# Patient Record
Sex: Male | Born: 1960
Health system: Southern US, Community
[De-identification: ages and names within clinical notes are randomized; demographics above are authoritative.]

## PROBLEM LIST (undated history)

## (undated) DIAGNOSIS — M25551 Pain in right hip: Secondary | ICD-10-CM

## (undated) DIAGNOSIS — G4730 Sleep apnea, unspecified: Secondary | ICD-10-CM

## (undated) DIAGNOSIS — I10 Essential (primary) hypertension: Secondary | ICD-10-CM

## (undated) DIAGNOSIS — N189 Chronic kidney disease, unspecified: Secondary | ICD-10-CM

## (undated) DIAGNOSIS — I5189 Other ill-defined heart diseases: Secondary | ICD-10-CM

## (undated) DIAGNOSIS — K219 Gastro-esophageal reflux disease without esophagitis: Secondary | ICD-10-CM

## (undated) DIAGNOSIS — E8722 Chronic metabolic acidosis: Secondary | ICD-10-CM

## (undated) DIAGNOSIS — I214 Non-ST elevation (NSTEMI) myocardial infarction: Secondary | ICD-10-CM

## (undated) DIAGNOSIS — E119 Type 2 diabetes mellitus without complications: Secondary | ICD-10-CM

## (undated) DIAGNOSIS — N183 Chronic kidney disease, stage 3 unspecified: Secondary | ICD-10-CM

## (undated) DIAGNOSIS — Z720 Tobacco use: Secondary | ICD-10-CM

## (undated) DIAGNOSIS — E785 Hyperlipidemia, unspecified: Secondary | ICD-10-CM

## (undated) DIAGNOSIS — I2510 Atherosclerotic heart disease of native coronary artery without angina pectoris: Secondary | ICD-10-CM

## (undated) DIAGNOSIS — M25552 Pain in left hip: Secondary | ICD-10-CM

## (undated) HISTORY — DX: Pain in right hip: M25.551

## (undated) HISTORY — DX: Chronic kidney disease, stage 3 unspecified: N18.30

## (undated) HISTORY — PX: EXPLORATORY LAPAROTOMY: SUR591

## (undated) HISTORY — PX: APPENDECTOMY: SHX54

## (undated) HISTORY — PX: HERNIA REPAIR: SHX51

## (undated) HISTORY — DX: Pain in right hip: M25.552

## (undated) HISTORY — PX: CARDIAC CATHETERIZATION: SHX172

## (undated) HISTORY — PX: COLONOSCOPY: SHX174

## (undated) HISTORY — DX: Other ill-defined heart diseases: I51.89

## (undated) HISTORY — PX: EYE SURGERY: SHX253

---

## 1997-10-04 ENCOUNTER — Inpatient Hospital Stay (HOSPITAL_COMMUNITY): Admission: EM | Admit: 1997-10-04 | Discharge: 1997-10-05 | Payer: Self-pay | Admitting: Emergency Medicine

## 2003-06-04 ENCOUNTER — Other Ambulatory Visit: Payer: Self-pay

## 2003-06-05 ENCOUNTER — Other Ambulatory Visit: Payer: Self-pay

## 2004-04-12 ENCOUNTER — Other Ambulatory Visit: Payer: Self-pay

## 2004-04-12 ENCOUNTER — Emergency Department: Payer: Self-pay | Admitting: General Practice

## 2004-11-12 ENCOUNTER — Inpatient Hospital Stay: Payer: Self-pay | Admitting: Internal Medicine

## 2004-11-12 ENCOUNTER — Other Ambulatory Visit: Payer: Self-pay

## 2004-11-14 ENCOUNTER — Other Ambulatory Visit: Payer: Self-pay

## 2006-02-26 ENCOUNTER — Other Ambulatory Visit: Payer: Self-pay

## 2006-02-26 ENCOUNTER — Ambulatory Visit: Payer: Self-pay | Admitting: Orthopedic Surgery

## 2006-03-05 ENCOUNTER — Ambulatory Visit: Payer: Self-pay | Admitting: Orthopedic Surgery

## 2007-02-28 ENCOUNTER — Emergency Department: Payer: Self-pay | Admitting: Emergency Medicine

## 2007-03-02 ENCOUNTER — Ambulatory Visit: Payer: Self-pay

## 2007-03-03 ENCOUNTER — Emergency Department (HOSPITAL_COMMUNITY): Admission: EM | Admit: 2007-03-03 | Discharge: 2007-03-03 | Payer: Self-pay | Admitting: *Deleted

## 2007-03-10 ENCOUNTER — Ambulatory Visit (HOSPITAL_COMMUNITY): Admission: RE | Admit: 2007-03-10 | Discharge: 2007-03-10 | Payer: Self-pay | Admitting: Neurosurgery

## 2007-03-12 ENCOUNTER — Ambulatory Visit (HOSPITAL_COMMUNITY): Admission: RE | Admit: 2007-03-12 | Discharge: 2007-03-13 | Payer: Self-pay | Admitting: Neurosurgery

## 2008-06-25 ENCOUNTER — Emergency Department: Payer: Self-pay | Admitting: Internal Medicine

## 2009-06-05 ENCOUNTER — Ambulatory Visit: Payer: Self-pay | Admitting: Gastroenterology

## 2009-12-22 ENCOUNTER — Ambulatory Visit: Payer: Self-pay

## 2010-03-14 ENCOUNTER — Inpatient Hospital Stay: Payer: Self-pay | Admitting: Surgery

## 2010-03-16 LAB — PATHOLOGY REPORT

## 2010-04-17 ENCOUNTER — Ambulatory Visit: Payer: Self-pay | Admitting: Surgery

## 2010-04-24 ENCOUNTER — Ambulatory Visit: Payer: Self-pay | Admitting: Surgery

## 2010-04-25 ENCOUNTER — Observation Stay: Payer: Self-pay | Admitting: Surgery

## 2010-04-30 LAB — PATHOLOGY REPORT

## 2010-05-01 ENCOUNTER — Ambulatory Visit: Payer: Self-pay | Admitting: Family Medicine

## 2010-05-14 ENCOUNTER — Ambulatory Visit: Payer: Self-pay | Admitting: Family Medicine

## 2010-07-03 NOTE — Consult Note (Signed)
[name], [name]                 ACCOUNT NO.:  [id number]   MEDICAL RECORD NO.:  [id number]          PATIENT TYPE:  EMS   LOCATION:  [facility]                         FACILITY:  [facility]   PHYSICIAN:  Ophelia Charter, M.D.DATE OF BIRTH:  Mar 13, 1960   DATE OF CONSULTATION:  03/03/2007  DATE OF DISCHARGE:  03/03/2007                                 CONSULTATION   CHIEF COMPLAINT:  Right arm pain.   HISTORY OF PRESENT ILLNESS:  The patient is a 50 year old white male who  was in his usual state of good health until approximately 51-month ago.  At that time, he woke up with a crack of my neck.  It progressed to  severe pain in his right shoulder as well as right arm.  He does not  recall any specific precipitating event.  The patient was seen by Dr.  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  He was able to help him  some, but the pain kept recurring.  He was then worked up with a  cervical CT which demonstrated some narrowing and he was referred to our  office.  The patient had been scheduled to see Dr. Joya Salm, on March 05, 2007, but he could not; the pain became bearable and he presented to  the emergency department on the evening of March 03, 2007.  The  patient was seen by the emergency room staff and a neurosurgical  consultation was requested.  The patient had been seen in [place] and  prescribed a steroid taper.   Presently, the patient is accompanied by his wife, daughter, and mother.  He complains of severe pain radiating into his right arm with numbness  and tingling what sounds like the C6 or C7 distribution on the right.  He does not have any radicular pain on the left.  His arm bothers him  more than his neck.  He feels that he is getting worse despite time in  medical management.   The patient has been treated with medications in the emergency  department including morphine, etc., and feels somewhat better  presently.  He has not had any physical therapy,  injections, NCS, EMGs,  etc.   PAST MEDICAL HISTORY:  Positive for myocardial infarction when he was 49  years old.  He has evidently had 3 myocardial infarctions altogether.  He sees Dr. Clayborn Bigness, a cardiologist in [employer].  According to the  patient, he underwent stress test about a month ago and it was okay.  The patient also has a history of diabetes mellitus, hypertension,  hypercholesterolemia, carpal tunnel syndrome, cataracts, and umbilical  hernia.   PAST SURGICAL HISTORY:  Carpal tunnel release, umbilical herniorrhaphy,  placement of cardiac stents, and cataract removed on the left.   PRESENT MEDICATIONS:  1. Plavix 75 mg p.o. daily.  2. Metoprolol 50 mg p.o. daily.  3. Lisinopril/hydrochlorothiazide 20/25 1 p.o. daily.  4. Metformin 1000 mg p.o. daily.  5. Glipizide 2.5 mg p.o. daily.  6. Crestor 40 mg p.o. daily.  7. Fish oil, vinegar pill, and aspirin 81 mg p.o. daily.   DRUG  ALLERGIES:  PENICILLIN causes swelling.   FAMILY MEDICAL HISTORY:  The patient's mother's age is 62 in good  health.  The patient's father died at age of 12 with heart attack.   SOCIAL HISTORY:  The patient is married.  He lives in [place].  He is  employed as a Administrator.  He smokes approximately 2 packs per day of  cigarettes.  I advised him to quit.  He denies ethanol drug use.   REVIEW OF SYSTEMS:  Negative except as above.  Denies chest pain.   PHYSICAL EXAMINATION:  GENERAL:  A pleasant, obese 50 year old white  male in no obvious distress complaining of right arm pain holding his  right arm over his head for relief.  HEENT:  Normocephalic and atraumatic.  Pupils are equal, round, and  reactive to light.  Extraocular movements are intact.  Oropharynx  benign.  NECK:  Supple.  There is no masses or deformities or tracheal deviation  or distention. The patient has limited cervical range of motion.  Spurling testing is positive on the right and negative on the left.  Lhermitte  sign was not present.  THORAX:  Symmetric.  LUNGS:  Clear to auscultation.  HEART:  Regular rate and rhythm.  ABDOMEN:  Obese and nontender.  EXTREMITIES:  Full good range of motion of his right shoulder.  No  deformities.  BACK:  Grossly normal.  NEUROLOGIC:  The patient is alert and oriented x3.  Cranial nerves II  through XII were examined bilaterally and grossly normal.  Vision and  hearing grossly normal bilaterally.  Motor strength is 5/5 in his  bilateral deltoids, biceps, handgrip, interosseous, psoas, quadriceps,  gastrocnemius, and extensor hallucis longus, left wrist extensor, and  triceps.  He has some slight weakness in his right wrist extensor and  triceps at 4+/5.  Cerebellar exam is intact with rapid alternating  movements of the upper extremities bilaterally.  Sensory exam  demonstrates decreased sensation in the C6 and/or C7 distribution of the  right; otherwise unremarkable.  Deep tendon reflexes are 1/4 in his  bilateral biceps, triceps, brachialis, quadriceps, and gastrocnemius.  There is no ankle clonus.   IMAGING STUDIES:  I have reviewed the patient's cervical CT performed  without contrast at [facility] on March 03, 2007.  The  patient appears to have a herniated disc at C5-C6 on the right with some  spondylosis resulting in significant right neuroforaminal stenosis.  The  resolution is not sufficient to rule out rupture disc to C6-C7 or C4-C5.   ASSESSMENT AND PLAN:  1. A C5-C6 herniated disc, cervical radiculopathy, and cervicalgia.  I      have discussed the situation with the patient and his family (at      the patient's request).  I told him that I think he rarely is      suffering from a right C6 radiculopathy caused by disc herniation      at C5-C6.  He cannot get a cervical MRI because he evidently has      metallic stents and because he is on metformin, glipizide, aspirin,      and I cannot do myelogram for about a week.  We therefore  setup for      a cervical CT as above that demonstrates some narrowing C5-C6, but      the resolution is not adequate for me to tell for sure he does not      have ruptured disc at that levels.  I therefore recommended we      treat him medically and planned to do a myelogram, cervical MRI,      and CT next week.  I described this procedure and the risk, mainly      of headaches.  I have answered all of his questions regarding that.      The patient would like to pursue the study and I have instructed      the patient to follow up with my office tomorrow, so we can make      these arrangements and I can discuss this further showing him his      CAT scan as well as surgical pamphlets, etc.  I gave the patient      the prescription for Percocet 10/325 mg #50 one  p.o. q.4 h. p.r.n.      for pain as well as Valium 5 mg #50 one p.o. q.8 h. for muscle      spasms.  He is going to finish out his      steroid taper and I will see him tomorrow.  2. Chronic issues.  I have also explained him that in order to pursue      surgery, we will need to get cardiac clearance from Dr. Clayborn Bigness.      We will put a call to him tomorrow.      Ophelia Charter, M.D.  Electronically Signed     JDJ/MEDQ  D:  03/04/2007  T:  03/04/2007  Job:  [id number]   cc:   Jillyn Hidden  Dwayne D. Clayborn Bigness, MD

## 2010-07-03 NOTE — Op Note (Signed)
NAMEARIS, DEROUSSE                 ACCOUNT NO.:  [id number]   MEDICAL RECORD NO.:  [id number]          PATIENT TYPE:  INP   LOCATION:  3036                         FACILITY:  [facility]   PHYSICIAN:  Ophelia Charter, M.D.DATE OF BIRTH:  1961-01-29   DATE OF PROCEDURE:  03/12/2007  DATE OF DISCHARGE:                               OPERATIVE REPORT   BRIEF HISTORY:  The patient is a 50 year old white male who has suffered  from approximately 6 weeks of severe neck and right arm pain consistent  with a right C6 radiculopathy.  He failed medical management, was worked  up with cervical myo-CT which demonstrated the patient had a herniated  disk at C5-6 on the right as well as diffuse mild degenerative changes.  I discussed various treatment options with the patient including  surgery.  He has weighed the risks, benefits and alternatives of surgery  and decided to proceed with a C5-6 anterior cervical diskectomy, fusion,  plating.   PREOPERATIVE DIAGNOSIS:  C5-6 herniated nucleus pulposus, spondylosis,  degenerative disease, stenosis, cervical radiculopathy, myelopathy,  cervicalgia.   POSTOPERATIVE DIAGNOSIS:  C5-6 herniated nucleus pulposus, spondylosis,  degenerative disease, stenosis, cervical radiculopathy, myelopathy,  cervicalgia.   PROCEDURE:  C5-6 extensive anterior cervical diskectomy/decompression;  C5-6 anterior interbody local autograft and Actifuse bone graft  extender, arthrodesis; insertion of C5-6 interbody prosthesis (Alphatec  PEEK interbody prosthesis); C5-6 anterior cervical plating (Codman slim  lock titanium plate and screws).   SURGEON:  Dr.  Earle Gell.   ASSISTANT:  Dr. Leeroy Cha.   ANESTHESIA:  General endotracheal.   ESTIMATED BLOOD LOSS:  50 mL.   SPECIMENS:  None.   DRAINS:  None.   COMPLICATIONS:  None.   DESCRIPTION OF PROCEDURE:  The patient was brought to the operating room  by anesthesia team, general endotracheal anesthesia was  induced.  The  patient remained supine position.  A roll was placed under his shoulders  to place his neck in slight extension.  The anterior cervical region was  then prepared with Betadine scrub and Betadine solution.  Sterile drapes  were applied.  I then injected the area to be incised with Marcaine with  epinephrine solution.  I used a scalpel to make a transverse incision in  the patient's left anterior neck.  I used the Metzenbaum scissors to  divide the platysma muscle and then to dissect medial to  sternocleidomastoid muscle, jugular vein and carotid artery.  I  carefully dissected down towards the anterior cervical spine identifying  the esophagus and retracted it medially.  I then used Kitner swabs to  clear soft tissue from the anterior cervical spine.  We then inserted a  bent spinal needle into the upper exposed intervertebral disk space.  We  obtained intraoperative radiograph confirm our location.   We then used electrocautery to detach medial border of the longus colli  muscle from the C5-6 intervertebral disk space bilaterally.  We then  inserted the Caspar self-retaining retractor underneath the longus colli  muscle bilaterally to provide exposure.  We incised C5-6 intervertebral  disk with a 15 blade  scalpel, performed a partial intervertebral  diskectomy using pituitary forceps and Carlen curettes.  We then  inserted distraction screws at C5-C6, distracted interspace and then  used a high-speed drill to decorticate the vertebral endplates at 075-GRM,  drill away the remainder of C5-6 intervertebral disk, drill away some  posterior spondylosis and to thin out the posterior longitudinal  ligament.  We then incised with the ligament with arachnoid knife and  then removed it with Kerrison punch undercutting the vertebral end  plates at 075-GRM decompressing thecal sac.  We then performed foraminotomy  about the bilateral C6 nerve root completing the decompression.  Of  note,  we encountered expected large herniated disk at C5-6 on the right  compressing the right C6 nerve root as well as spondylosis.   Having completed the decompression, we now turned attention to  arthrodesis.  We used trial spacers and determined to use a 6-mm medium  PEEK body prosthesis.  We prefilled this prosthesis with local  morselized autograft bone we obtained during decompression as well as  Actifuse bone graft extender.  We inserted the prosthesis into the  distracted interspace and then removed distraction screws.  There was  good snug fit of the prosthesis.  We then filled laterally in disk pace  with Actifuse.   We now turned attention to the anterior instrumentation.  We used a high-  speed drill to remove some ventral spondylosis from vertebral endplates  075-GRM so that the plate lay down flat.  We selected appropriate length  Codman slim lock anterior cervical plate and laid it along the anterior  aspect of the vertebral bodies from C5 to C6.  We then drilled two 14 mm  holes at C5, two at C6 and secured the plate to the vertebral bodies by  placing two 14 mm self-tapping screws at C5, two at C6.  We then  obtained intraoperative radiograph to check the instrumentation.  There  was limited visualization because of the patient's body habitus but it  looked good in vivo.  We therefore secured these screws and plate by  locking each cam.   We then obtained hemostasis with bipolar cautery, irrigated the wound  out with bacitracin solution.  We then removed the retractor.  We  inspected the esophagus for any damage and there was none apparent.  We  then reapproximated the patient's platysma muscle with interrupted 3-0  Vicryl suture, subcutaneous tissue interrupted 3-0 Vicryl suture and the  skin with Steri-Strips and Benzoin.  The wound was then coated with  bacitracin ointment.  Sterile dressing applied.  The drapes were  removed.  The patient was subsequently extubated by  anesthesia team and  transported to the post anesthesia care unit in stable condition.  All  sponge, instrument and needle counts were correct at end of the case.      Ophelia Charter, M.D.  Electronically Signed     JDJ/MEDQ  D:  03/12/2007  T:  03/13/2007  Job:  [id number]

## 2010-11-08 LAB — BASIC METABOLIC PANEL
BUN: 11
CO2: 27
Calcium: 10
Chloride: 98
Creatinine, Ser: 0.97
GFR calc non Af Amer: >60
Glucose, Bld: 261 — ABNORMAL HIGH
Potassium: 4.9
Sodium: 133 — ABNORMAL LOW

## 2010-11-08 LAB — CBC
HCT: 48.5
Hemoglobin: 16.8
MCHC: 34.6
MCV: 90.4
Platelets: 225
RBC: 5.37
RDW: 12.6
WBC: 10.9 — ABNORMAL HIGH

## 2010-11-08 LAB — GLUCOSE, RANDOM: Glucose, Bld: 285 — ABNORMAL HIGH

## 2010-11-08 LAB — POCT I-STAT GLUCOSE
Glucose, Bld: 184 — ABNORMAL HIGH
Operator id: 132841

## 2011-02-05 ENCOUNTER — Emergency Department: Payer: Self-pay | Admitting: Emergency Medicine

## 2011-02-27 ENCOUNTER — Ambulatory Visit: Payer: Self-pay | Admitting: Surgery

## 2011-02-27 LAB — CBC WITH DIFFERENTIAL/PLATELET
Basophil #: 0 10*3/uL (ref 0.0–0.1)
Basophil %: 0.5 %
Eosinophil #: 0.2 10*3/uL (ref 0.0–0.7)
Eosinophil %: 2.5 %
HCT: 40.8 % (ref 40.0–52.0)
HGB: 14.1 g/dL (ref 13.0–18.0)
Lymphocyte #: 3.2 10*3/uL (ref 1.0–3.6)
Lymphocyte %: 33 %
MCH: 31.8 pg (ref 26.0–34.0)
MCHC: 34.7 g/dL (ref 32.0–36.0)
MCV: 92 fL (ref 80–100)
Monocyte #: 0.7 10*3/uL (ref 0.0–0.7)
Monocyte %: 7.5 %
Neutrophil #: 5.5 10*3/uL (ref 1.4–6.5)
Neutrophil %: 56.5 %
Platelet: 191 10*3/uL (ref 150–440)
RBC: 4.45 10*6/uL (ref 4.40–5.90)
RDW: 12.8 % (ref 11.5–14.5)
WBC: 9.7 10*3/uL (ref 3.8–10.6)

## 2011-02-27 LAB — BASIC METABOLIC PANEL
Anion Gap: 9 (ref 7–16)
BUN: 13 mg/dL (ref 7–18)
Calcium, Total: 9.2 mg/dL (ref 8.5–10.1)
Chloride: 100 mmol/L (ref 98–107)
Co2: 25 mmol/L (ref 21–32)
Creatinine: 0.97 mg/dL (ref 0.60–1.30)
EGFR (African American): >60
EGFR (Non-African Amer.): >60
Glucose: 354 mg/dL — ABNORMAL HIGH (ref 65–99)
Osmolality: 283 (ref 275–301)
Potassium: 4.5 mmol/L (ref 3.5–5.1)
Sodium: 134 mmol/L — ABNORMAL LOW (ref 136–145)

## 2011-03-04 ENCOUNTER — Other Ambulatory Visit: Payer: Self-pay | Admitting: Family Medicine

## 2011-03-04 LAB — RENAL FUNCTION PANEL
Albumin: 4.3 g/dL (ref 3.4–5.0)
Anion Gap: 9 (ref 7–16)
BUN: 16 mg/dL (ref 7–18)
Calcium, Total: 10 mg/dL (ref 8.5–10.1)
Chloride: 102 mmol/L (ref 98–107)
Co2: 28 mmol/L (ref 21–32)
Creatinine: 0.95 mg/dL (ref 0.60–1.30)
EGFR (African American): >60
EGFR (Non-African Amer.): >60
Glucose: 187 mg/dL — ABNORMAL HIGH (ref 65–99)
Osmolality: 284 (ref 275–301)
Phosphorus: 3.9 mg/dL (ref 2.5–4.9)
Potassium: 4.8 mmol/L (ref 3.5–5.1)
Sodium: 139 mmol/L (ref 136–145)

## 2011-03-06 ENCOUNTER — Ambulatory Visit: Payer: Self-pay | Admitting: Surgery

## 2011-11-28 ENCOUNTER — Other Ambulatory Visit: Payer: Self-pay | Admitting: Family Medicine

## 2011-11-28 ENCOUNTER — Ambulatory Visit
Admission: RE | Admit: 2011-11-28 | Discharge: 2011-11-28 | Disposition: A | Discharge status: Home / Self Care | Payer: Worker's Compensation | Source: Ambulatory Visit | Attending: Family Medicine | Admitting: Family Medicine

## 2011-11-28 DIAGNOSIS — W19XXXA Unspecified fall, initial encounter: Secondary | ICD-10-CM

## 2011-11-28 DIAGNOSIS — M542 Cervicalgia: Secondary | ICD-10-CM

## 2012-09-12 IMAGING — CR DG ABDOMEN 1V
1 series · 2 of 2 positions shown · non-contrast
Comparison: none

REASON FOR EXAM: f/u SBO
COMMENTS:

[Series 1: view not recorded · 0.17mm/px · 2 of 2 slices shown]
[im 1/2]
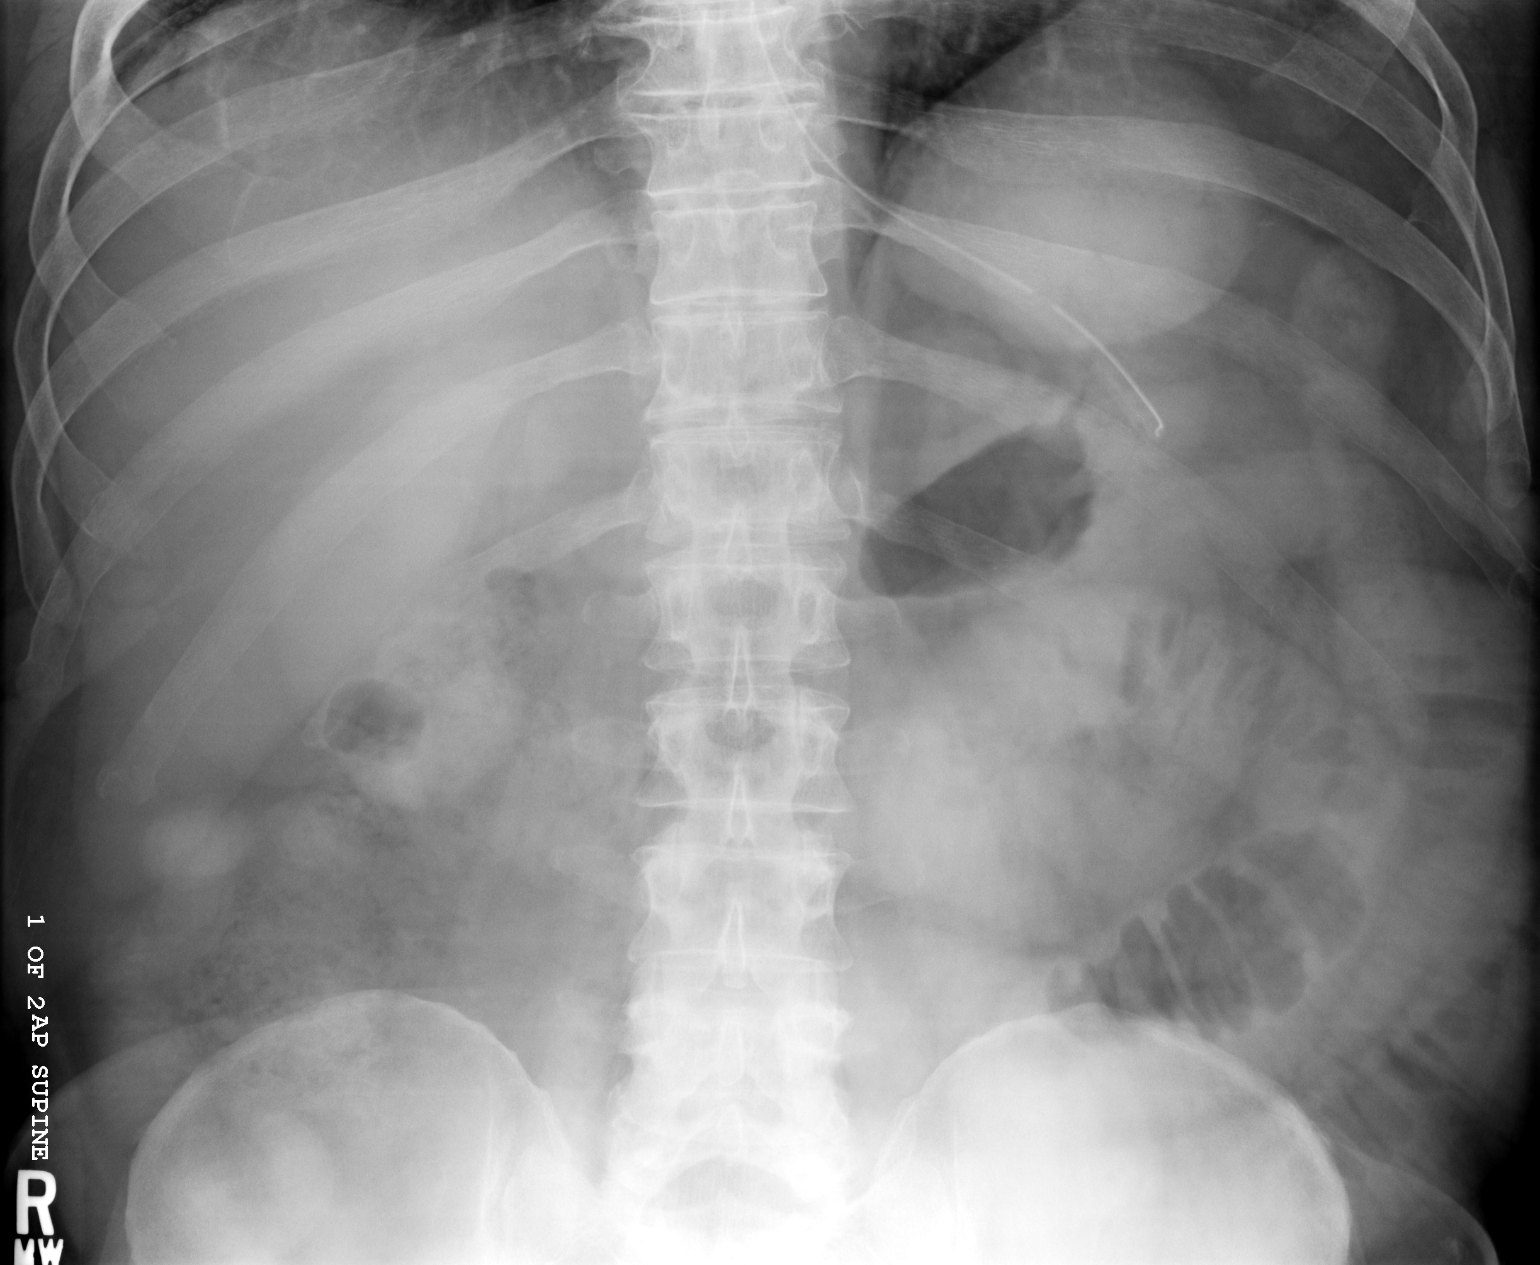
[im 2/2]
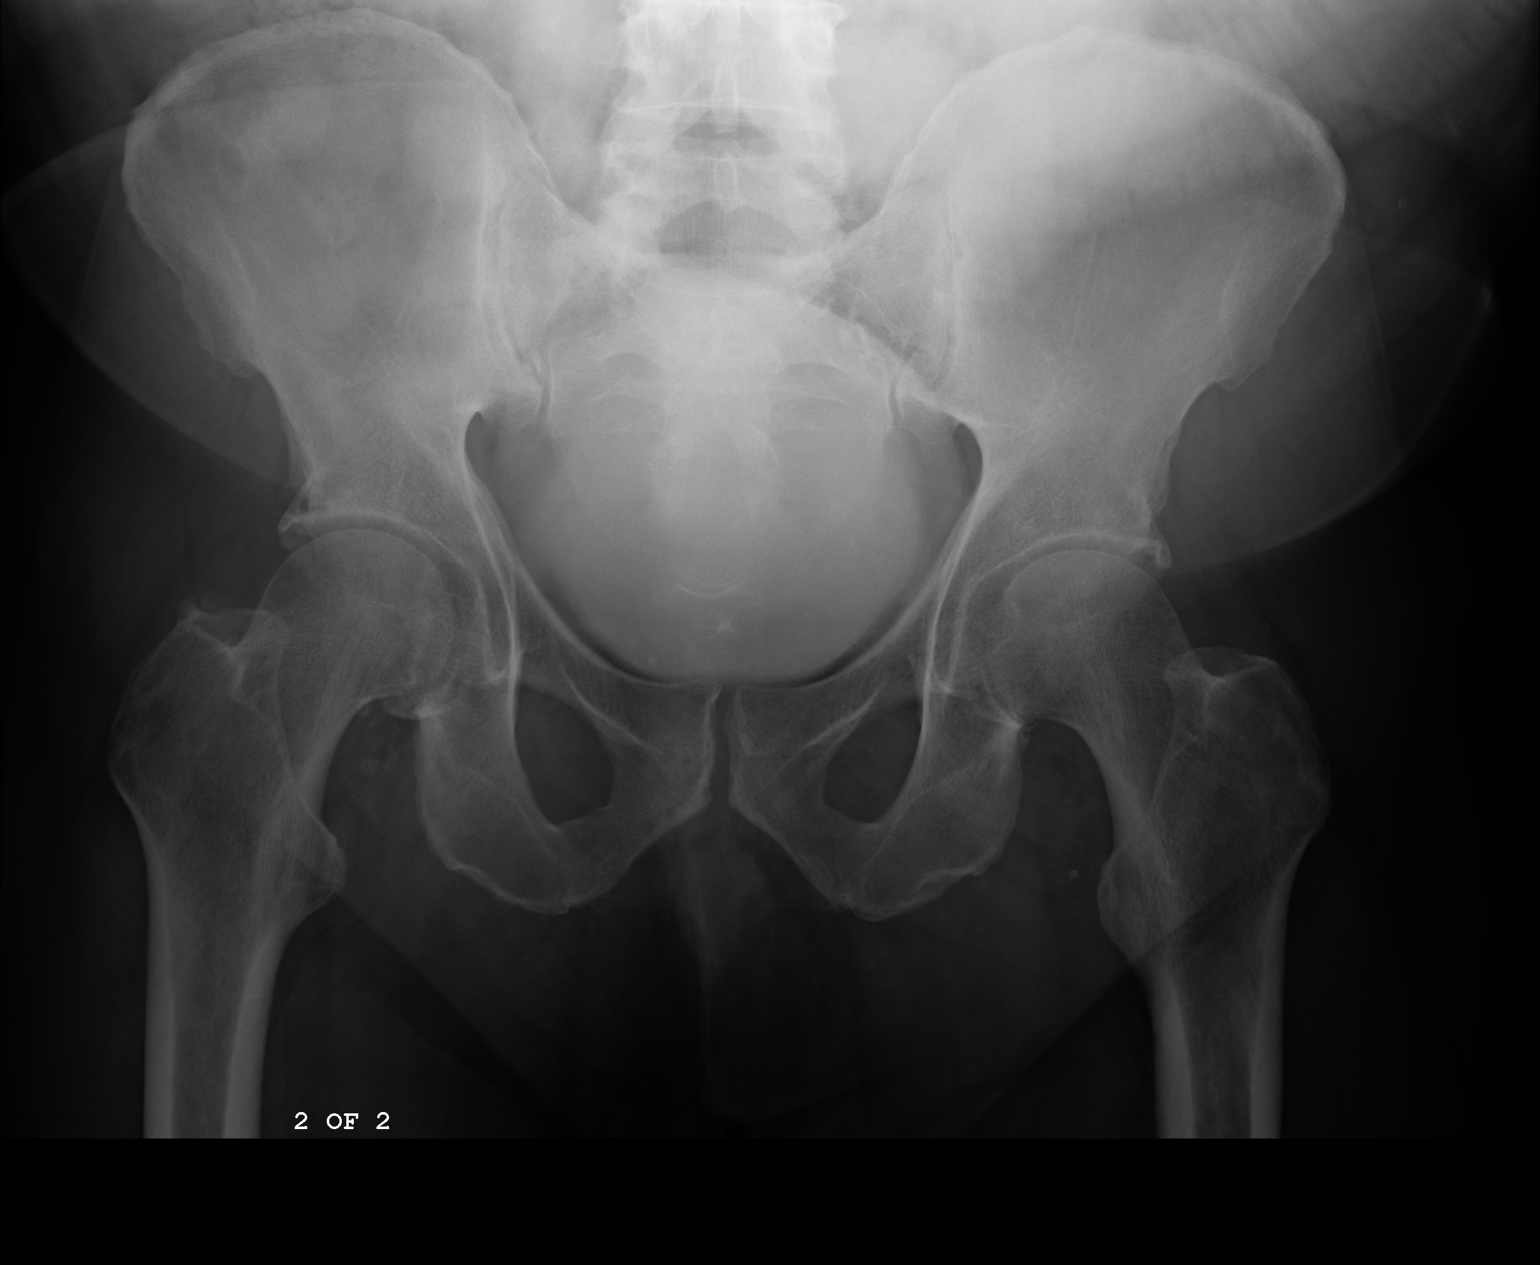

[2 of 2 positions shown; findings below may reference images not displayed]

PROCEDURE:     DXR - DXR KIDNEY URETER BLADDER  - March 14, 2010  [DATE]

RESULT:     There is small amount of dilute contrast visualized in dilated
loops of small bowel, primarily in the left abdomen. Additionally, there
appears to be a small amount of contrast in the right colon. A nasogastric
tube is present with the distal portion in the stomach.
IMPRESSION: Only dilute contrast is visualized. For the most part, the
contrast is within dilated loops of small bowel in the left abdomen. Little
or no gas is seen in the colon. The findings remain consistent with partial
small bowel obstruction.

## 2012-09-13 IMAGING — CR DG CHEST 1V PORT
1 series · 1 of 1 positions shown · non-contrast
Comparison: none

REASON FOR EXAM: hypoxia
COMMENTS:

PROCEDURE:     DXR - DXR PORTABLE CHEST SINGLE VIEW  - March 15, 2010  [DATE]
RESULT:     Comparison is made to the prior exam of 03/13/2010.
The lung fields are clear. No pneumonia, pneumothorax or pleural effusion is
seen. The heart size is normal. Monitoring electrodes are present.

[view not recorded]
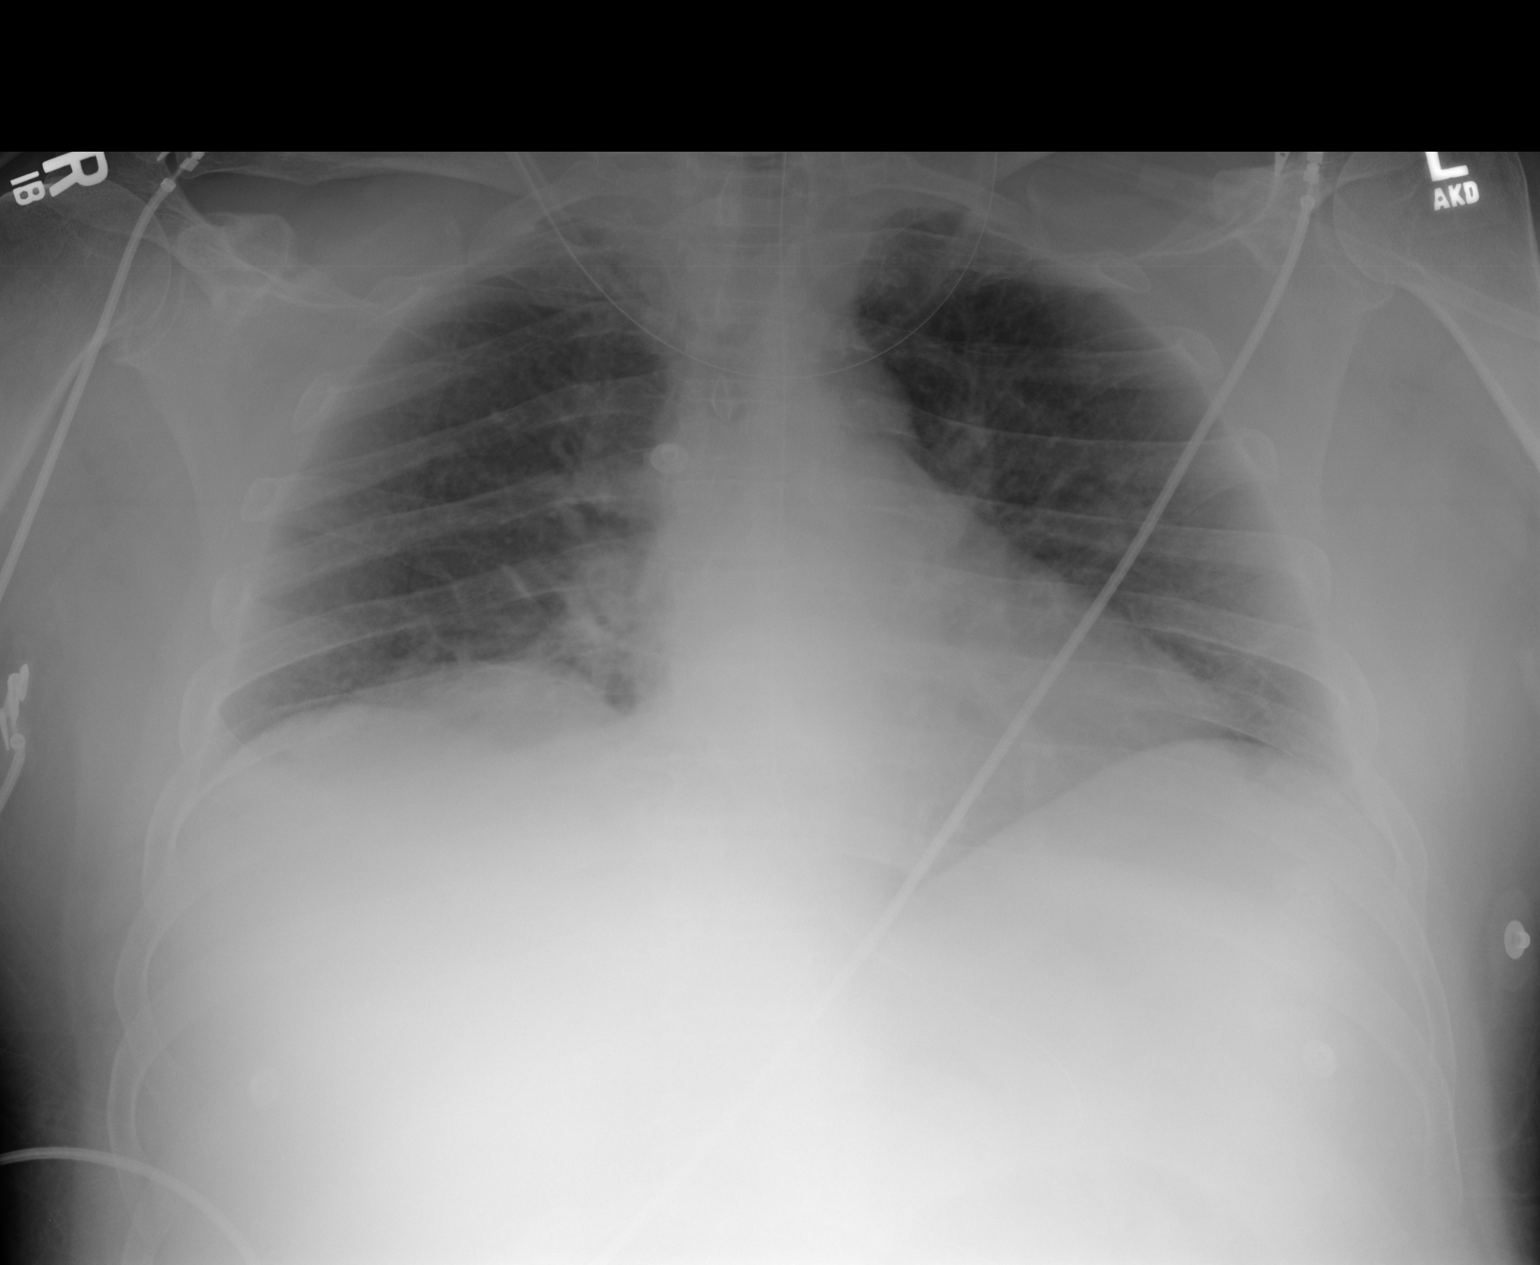

[1 of 1 positions shown; findings below may reference images not displayed]

IMPRESSION: No acute changes are identified.

## 2014-06-12 NOTE — Op Note (Signed)
PATIENT NAME:  [name], [name] MR#:  [id number] DATE OF BIRTH:  03-05-1960  DATE OF PROCEDURE:  03/06/2011  PREOPERATIVE DIAGNOSIS: Incisional ventral hernia.   POSTOPERATIVE DIAGNOSIS: Incisional ventral hernia.   PROCEDURE PERFORMED: Repair of incisional ventral hernia with 8 x 12 cm oval Kugel Prolene mesh subfascial preperitoneal repair.   SURGEON: Bryna Razavi A. Marina Gravel, MD  ASSISTANT: Surgical scrub technologist.   TYPE OF ANESTHESIA: General endotracheal and local.   INDICATIONS: 54 year old obese white male presented one year ago with bowel obstruction secondary to adhesions around a previously placed umbilical herniorrhaphy mesh. Mesh was removed. Patient then had a laparoscopic appendectomy six weeks later. The patient unfortunately has developed a painful incisional ventral hernia above his umbilicus. I discussed with him and his wife subfascial preperitoneal repair. He understands and wishes to proceed.   FINDINGS: There was approximately 4 x 5 cm hernia defect above the umbilicus. There was a small umbilical hernia defect which was incorporated into the repair.   SPECIMENS: None.   ESTIMATED BLOOD LOSS: Minimal.   DESCRIPTION OF PROCEDURE: With the patient in the supine position, general endotracheal anesthesia was induced. His abdomen having been previously clipped of hair was prepped and draped with ChloraPrep solution and Ioban draping. Timeout procedure was observed.   The previous upper midline skin incision was reopened with scalpel and carried to just below the umbilicus on the right side with scalpel. Electrocautery was used to divide subcutaneous tissues. The hernia sac was immediately identifiable. It was opened sharply with scissors. Fascial edge on the left side had retracted several centimeters laterally. The right side, the fascia was almost at the midline. Small umbilical hernia defect was noted measuring less than 1 cm. Preperitoneal pocket was then fashioned  circumferentially around the fascial defect. Hernia sac was resected back to the fascial edge.   With adequate preperitoneal pocket created the peritoneum was then closed easily with a running locking #0 Vicryl suture from above and below. An 8 cm x 12 cm oval Kugel Prolene mesh was brought onto the field, double layered. It was inserted into the pocket, unfurled nicely without any crimping and secured at multiple points circumferentially with mattress type sutures of 0 Ethibond. The fascia and remaining hernia sac contents were then closed over the mesh without any undue tension with #0 Vicryl suture fascia having been undermined anterior to the rectus sheath. Subcutaneous tissues were irrigated with GU irrigant. LAP and needle count correct x2. The subcutaneous tissues were then obliterated with multiple 2-0 Vicryl suture, 2-0 deep dermal sutures were then placed in inverted fashion followed by skin stapler. Prior to closure, a total of 30 mL of 0.5% Marcaine plain was infiltrated along the anterior fascia and subcutaneous tissues for postoperative analgesic affect. A sterile occlusive dressing consisting of 4 x 4's and Tegaderm was then applied. Patient tolerated procedure without immediate complication.   ____________________________ Jeannette How Marina Gravel, MD mab:cms D: 03/06/2011 09:41:49 ET T: 03/06/2011 10:04:41 ET JOB#: [id number]  cc: Elta Guadeloupe A. Marina Gravel, MD, <Dictator> Hortencia Conradi MD ELECTRONICALLY SIGNED 03/08/2011 17:35

## 2014-07-07 ENCOUNTER — Emergency Department
Admission: EM | Admit: 2014-07-07 | Discharge: 2014-07-07 | Disposition: A | Discharge status: Home / Self Care | Payer: BLUE CROSS/BLUE SHIELD | Attending: Emergency Medicine | Admitting: Emergency Medicine

## 2014-07-07 ENCOUNTER — Emergency Department: Payer: BLUE CROSS/BLUE SHIELD

## 2014-07-07 DIAGNOSIS — Z720 Tobacco use: Secondary | ICD-10-CM | POA: Insufficient documentation

## 2014-07-07 DIAGNOSIS — R0789 Other chest pain: Secondary | ICD-10-CM | POA: Diagnosis not present

## 2014-07-07 DIAGNOSIS — R109 Unspecified abdominal pain: Secondary | ICD-10-CM | POA: Diagnosis not present

## 2014-07-07 DIAGNOSIS — Z880 Allergy status to penicillin: Secondary | ICD-10-CM | POA: Diagnosis not present

## 2014-07-07 DIAGNOSIS — R1031 Right lower quadrant pain: Secondary | ICD-10-CM | POA: Diagnosis present

## 2014-07-07 DIAGNOSIS — I10 Essential (primary) hypertension: Secondary | ICD-10-CM | POA: Diagnosis not present

## 2014-07-07 HISTORY — DX: Essential (primary) hypertension: I10

## 2014-07-07 HISTORY — DX: Atherosclerotic heart disease of native coronary artery without angina pectoris: I25.10

## 2014-07-07 HISTORY — DX: Non-ST elevation (NSTEMI) myocardial infarction: I21.4

## 2014-07-07 LAB — COMPREHENSIVE METABOLIC PANEL
ALT: 20 U/L (ref 17–63)
AST: 22 U/L (ref 15–41)
Albumin: 4.1 g/dL (ref 3.5–5.0)
Alkaline Phosphatase: 51 U/L (ref 38–126)
Anion gap: 8 (ref 5–15)
BUN: 23 mg/dL — ABNORMAL HIGH (ref 6–20)
CO2: 25 mmol/L (ref 22–32)
Calcium: 9.4 mg/dL (ref 8.9–10.3)
Chloride: 103 mmol/L (ref 101–111)
Creatinine, Ser: 1.53 mg/dL — ABNORMAL HIGH (ref 0.61–1.24)
GFR calc Af Amer: 57 mL/min — ABNORMAL LOW (ref >60)
GFR calc non Af Amer: 50 mL/min — ABNORMAL LOW (ref >60)
Glucose, Bld: 276 mg/dL — ABNORMAL HIGH (ref 65–99)
Potassium: 4.1 mmol/L (ref 3.5–5.1)
Sodium: 136 mmol/L (ref 135–145)
Total Bilirubin: 0.4 mg/dL (ref 0.3–1.2)
Total Protein: 7.3 g/dL (ref 6.5–8.1)

## 2014-07-07 LAB — CBC WITH DIFFERENTIAL/PLATELET
Basophils Absolute: 0.1 10*3/uL (ref 0–0.1)
Basophils Relative: 1 %
Eosinophils Absolute: 0.3 10*3/uL (ref 0–0.7)
Eosinophils Relative: 3 %
HCT: 38.9 % — ABNORMAL LOW (ref 40.0–52.0)
Hemoglobin: 13 g/dL (ref 13.0–18.0)
Lymphocytes Relative: 35 %
Lymphs Abs: 3.7 10*3/uL — ABNORMAL HIGH (ref 1.0–3.6)
MCH: 30.8 pg (ref 26.0–34.0)
MCHC: 33.5 g/dL (ref 32.0–36.0)
MCV: 91.9 fL (ref 80.0–100.0)
Monocytes Absolute: 0.8 10*3/uL (ref 0.2–1.0)
Monocytes Relative: 7 %
Neutro Abs: 5.9 10*3/uL (ref 1.4–6.5)
Neutrophils Relative %: 54 %
Platelets: 249 10*3/uL (ref 150–440)
RBC: 4.23 MIL/uL — ABNORMAL LOW (ref 4.40–5.90)
RDW: 13.5 % (ref 11.5–14.5)
WBC: 10.8 10*3/uL — ABNORMAL HIGH (ref 3.8–10.6)

## 2014-07-07 LAB — URINALYSIS COMPLETE WITH MICROSCOPIC (ARMC ONLY)
Bacteria, UA: NONE SEEN
Bilirubin Urine: NEGATIVE
Glucose, UA: >500 mg/dL — AB
Hgb urine dipstick: NEGATIVE
Ketones, ur: NEGATIVE mg/dL
Leukocytes, UA: NEGATIVE
Nitrite: NEGATIVE
Protein, ur: NEGATIVE mg/dL
Specific Gravity, Urine: 1.007 (ref 1.005–1.030)
Squamous Epithelial / LPF: NONE SEEN
pH: 5 (ref 5.0–8.0)

## 2014-07-07 NOTE — ED Provider Notes (Signed)
[facility] Emergency Department Provider Note     Time seen: ----------------------------------------- 9:32 PM on 07/07/2014 -----------------------------------------    I have reviewed the triage vital signs and the nursing notes.   HISTORY  Chief Complaint Flank Pain    HPI [name] is a 54 y.o. male who presents ER for right lower quadrant and right flank pain for last week. It is sharp and has been increasing since chest. 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 No other associated symptoms pain currently is mild.     Past Medical History  Diagnosis Date  . MI, acute, non ST segment elevation   . Coronary artery disease   . Hypertension     There are no active problems to display for this patient.   Past Surgical History  Procedure Laterality Date  . Appendectomy    . Hernia repair    . Eye surgery      No current outpatient prescriptions on file.  Allergies Penicillins  No family history on file.  Social History History  Substance Use Topics  . Smoking status: Current Every Day Smoker  . Smokeless tobacco: Never Used  . Alcohol Use: No    Review of Systems Constitutional: Negative for fever. Eyes: Negative for visual changes. ENT: Negative for sore throat. Cardiovascular: Negative for chest pain. Respiratory: Negative for shortness of breath. Gastrointestinal: Positive for abdominal pain, negative for vomiting and diarrhea. Genitourinary: Negative for dysuria. Musculoskeletal: Negative for back pain. Skin: Negative for rash. Neurological: Negative for headaches, focal weakness or numbness.  10-point ROS otherwise negative.  ____________________________________________   PHYSICAL EXAM:  VITAL SIGNS: ED Triage Vitals  Enc Vitals Group     BP 07/07/14 2020 126/81 mmHg     Pulse Rate 07/07/14 2020 82     Resp 07/07/14 2020 16     Temp 07/07/14 2020  97.8 F (36.6 C)     Temp Source 07/07/14 2020 Oral     SpO2 07/07/14 2020 96 %     Weight 07/07/14 2020 220 lb (99.791 kg)     Height 07/07/14 2020 5\' 6"  (1.676 m)     Head Cir --      Peak Flow --      Pain Score 07/07/14 2025 8     Pain Loc --      Pain Edu? --      Excl. in [place]? --    Constitutional: Alert and oriented. Well appearing and in no distress. Eyes: Conjunctivae are normal. PERRL. Normal extraocular movements. ENT   Head: Normocephalic and atraumatic.   Nose: No congestion/rhinnorhea.   Mouth/Throat: Mucous membranes are moist.   Neck: No stridor. Hematological/Lymphatic/Immunilogical: No cervical lymphadenopathy. Cardiovascular: Normal rate, regular rhythm. Normal and symmetric distal pulses are present in all extremities. No murmurs, rubs, or gallops. Respiratory: Normal respiratory effort without tachypnea nor retractions. Breath sounds are clear and equal bilaterally. No wheezes/rales/rhonchi. Gastrointestinal: Soft and nontender. No distention. No abdominal bruits. There is no CVA tenderness. Musculoskeletal: Nontender with normal range of motion in all extremities. No joint effusions.  No lower extremity tenderness nor edema. Neurologic:  Normal speech and language. No gross focal neurologic deficits are appreciated. Speech is normal. No gait instability. Skin:  Skin is warm, dry and intact. No rash noted. Psychiatric: Mood and affect are normal. Speech and behavior are normal. Patient exhibits appropriate insight and judgment.  ____________________________________________    LABS (  pertinent positives/negatives)  Labs Reviewed  CBC WITH DIFFERENTIAL/PLATELET - Abnormal; Notable for the following:    WBC 10.8 (*)    RBC 4.23 (*)    HCT 38.9 (*)    Lymphs Abs 3.7 (*)    All other components within normal limits  COMPREHENSIVE METABOLIC PANEL - Abnormal; Notable for the following:    Glucose, Bld 276 (*)    BUN 23 (*)    Creatinine, Ser 1.53  (*)    GFR calc non Af Amer 50 (*)    GFR calc Af Amer 57 (*)    All other components within normal limits  URINALYSIS COMPLETEWITH MICROSCOPIC ([facility])  - Abnormal; Notable for the following:    Color, Urine STRAW (*)    APPearance CLEAR (*)    Glucose, UA >500 (*)    All other components within normal limits    ____________________________________________  ED COURSE:  Pertinent labs & imaging results that were available during my care of the patient were reviewed by me and considered in my medical decision making (see chart for details).  We'll CT per renal protocol to evaluate for stone ____________________________________________   RADIOLOGY  CT of the pelvis without contrast IMPRESSION: 1. No renal stones or obstructive uropathy. No acute abnormality in the abdomen/pelvis. 2. Hepatomegaly and hepatic steatosis. 3. Atherosclerosis, including coronary artery calcifications. 4. Probable remote omental infarcts/fat necrosis in the left lower quadrant. No surrounding inflammatory change to suggest acute process.  ____________________________________________    FINAL ASSESSMENT AND PLAN  Flank pain   Plan: Patient is discouraged to take over-the-counter medication as needed for his symptoms. No acute emergency medical condition identified this time. Stable for outpatient follow-up.    Earleen Newport, MD   Earleen Newport, MD 07/07/14 [phone]

## 2014-07-07 NOTE — Discharge Instructions (Signed)
Flank Pain °Flank pain refers to pain that is located on the side of the body between the upper abdomen and the back. The pain may occur over a short period of time (acute) or may be long-term or reoccurring (chronic). It may be mild or severe. Flank pain can be caused by many things. °CAUSES  °Some of the more common causes of flank pain include: °· Muscle strains.   °· Muscle spasms.   °· A disease of your spine (vertebral disk disease).   °· A lung infection (pneumonia).   °· Fluid around your lungs (pulmonary edema).   °· A kidney infection.   °· Kidney stones.   °· A very painful skin rash caused by the chickenpox virus (shingles).   °· Gallbladder disease.   °HOME CARE INSTRUCTIONS  °Home care will depend on the cause of your pain. In general, °· Rest as directed by your caregiver. °· Drink enough fluids to keep your urine clear or pale yellow. °· Only take over-the-counter or prescription medicines as directed by your caregiver. Some medicines may help relieve the pain. °· Tell your caregiver about any changes in your pain. °· Follow up with your caregiver as directed. °SEEK IMMEDIATE MEDICAL CARE IF:  °· Your pain is not controlled with medicine.   °· You have new or worsening symptoms. °· Your pain increases.   °· You have abdominal pain.   °· You have shortness of breath.   °· You have persistent nausea or vomiting.   °· You have swelling in your abdomen.   °· You feel faint or pass out.   °· You have blood in your urine. °· You have a fever or persistent symptoms for more than 2-3 days. °· You have a fever and your symptoms suddenly get worse. °MAKE SURE YOU:  °· Understand these instructions. °· Will watch your condition. °· Will get help right away if you are not doing well or get worse. °Document Released: 03/28/2005 Document Revised: 10/30/2011 Document Reviewed: 09/19/2011 °ExitCare® Patient Information ©2015 [employer]. This information is not intended to replace advice given to you by your  health care provider. Make sure you discuss any questions you have with your health care provider. ° °

## 2014-07-07 NOTE — ED Notes (Signed)
Pt c/o RLQ pain x 1 week. Pt reports increased pain starting yesterday.

## 2016-03-22 DIAGNOSIS — G4733 Obstructive sleep apnea (adult) (pediatric): Secondary | ICD-10-CM | POA: Diagnosis not present

## 2016-04-15 DIAGNOSIS — E7800 Pure hypercholesterolemia, unspecified: Secondary | ICD-10-CM | POA: Diagnosis not present

## 2016-04-15 DIAGNOSIS — E1165 Type 2 diabetes mellitus with hyperglycemia: Secondary | ICD-10-CM | POA: Diagnosis not present

## 2016-04-15 DIAGNOSIS — I10 Essential (primary) hypertension: Secondary | ICD-10-CM | POA: Diagnosis not present

## 2016-04-15 DIAGNOSIS — I2510 Atherosclerotic heart disease of native coronary artery without angina pectoris: Secondary | ICD-10-CM | POA: Diagnosis not present

## 2016-06-24 DIAGNOSIS — G4733 Obstructive sleep apnea (adult) (pediatric): Secondary | ICD-10-CM | POA: Diagnosis not present

## 2016-08-26 DIAGNOSIS — G4733 Obstructive sleep apnea (adult) (pediatric): Secondary | ICD-10-CM | POA: Diagnosis not present

## 2016-08-26 DIAGNOSIS — R600 Localized edema: Secondary | ICD-10-CM | POA: Diagnosis not present

## 2016-08-26 DIAGNOSIS — S93601A Unspecified sprain of right foot, initial encounter: Secondary | ICD-10-CM | POA: Diagnosis not present

## 2016-10-03 DIAGNOSIS — G4733 Obstructive sleep apnea (adult) (pediatric): Secondary | ICD-10-CM | POA: Diagnosis not present

## 2016-11-12 DIAGNOSIS — I10 Essential (primary) hypertension: Secondary | ICD-10-CM | POA: Diagnosis not present

## 2016-11-12 DIAGNOSIS — E1165 Type 2 diabetes mellitus with hyperglycemia: Secondary | ICD-10-CM | POA: Diagnosis not present

## 2016-11-12 DIAGNOSIS — I2510 Atherosclerotic heart disease of native coronary artery without angina pectoris: Secondary | ICD-10-CM | POA: Diagnosis not present

## 2016-11-12 DIAGNOSIS — Z79899 Other long term (current) drug therapy: Secondary | ICD-10-CM | POA: Diagnosis not present

## 2016-12-06 DIAGNOSIS — Z23 Encounter for immunization: Secondary | ICD-10-CM | POA: Diagnosis not present

## 2016-12-29 DIAGNOSIS — R200 Anesthesia of skin: Secondary | ICD-10-CM | POA: Diagnosis not present

## 2016-12-29 DIAGNOSIS — I2510 Atherosclerotic heart disease of native coronary artery without angina pectoris: Secondary | ICD-10-CM | POA: Diagnosis not present

## 2016-12-29 DIAGNOSIS — S46211A Strain of muscle, fascia and tendon of other parts of biceps, right arm, initial encounter: Secondary | ICD-10-CM | POA: Diagnosis not present

## 2016-12-29 DIAGNOSIS — E119 Type 2 diabetes mellitus without complications: Secondary | ICD-10-CM | POA: Diagnosis not present

## 2016-12-29 DIAGNOSIS — S46121A Laceration of muscle, fascia and tendon of long head of biceps, right arm, initial encounter: Secondary | ICD-10-CM | POA: Diagnosis not present

## 2016-12-29 DIAGNOSIS — M79601 Pain in right arm: Secondary | ICD-10-CM | POA: Diagnosis not present

## 2016-12-31 DIAGNOSIS — S46111A Strain of muscle, fascia and tendon of long head of biceps, right arm, initial encounter: Secondary | ICD-10-CM | POA: Diagnosis not present

## 2017-01-08 DIAGNOSIS — G4733 Obstructive sleep apnea (adult) (pediatric): Secondary | ICD-10-CM | POA: Diagnosis not present

## 2017-01-10 ENCOUNTER — Emergency Department (HOSPITAL_COMMUNITY)
Admission: EM | Admit: 2017-01-10 | Discharge: 2017-01-10 | Disposition: A | Discharge status: Home / Self Care | Payer: 59 | Attending: Emergency Medicine | Admitting: Emergency Medicine

## 2017-01-10 ENCOUNTER — Emergency Department (HOSPITAL_BASED_OUTPATIENT_CLINIC_OR_DEPARTMENT_OTHER)
Admit: 2017-01-10 | Discharge: 2017-01-10 | Disposition: A | Discharge status: Home / Self Care | Payer: 59 | Attending: Emergency Medicine | Admitting: Emergency Medicine

## 2017-01-10 ENCOUNTER — Encounter (HOSPITAL_COMMUNITY): Payer: Self-pay

## 2017-01-10 DIAGNOSIS — S46211D Strain of muscle, fascia and tendon of other parts of biceps, right arm, subsequent encounter: Secondary | ICD-10-CM

## 2017-01-10 DIAGNOSIS — I252 Old myocardial infarction: Secondary | ICD-10-CM | POA: Diagnosis not present

## 2017-01-10 DIAGNOSIS — M79621 Pain in right upper arm: Secondary | ICD-10-CM | POA: Diagnosis present

## 2017-01-10 DIAGNOSIS — Y999 Unspecified external cause status: Secondary | ICD-10-CM | POA: Diagnosis not present

## 2017-01-10 DIAGNOSIS — I2510 Atherosclerotic heart disease of native coronary artery without angina pectoris: Secondary | ICD-10-CM | POA: Insufficient documentation

## 2017-01-10 DIAGNOSIS — R2231 Localized swelling, mass and lump, right upper limb: Secondary | ICD-10-CM | POA: Insufficient documentation

## 2017-01-10 DIAGNOSIS — S46221A Laceration of muscle, fascia and tendon of other parts of biceps, right arm, initial encounter: Secondary | ICD-10-CM | POA: Diagnosis not present

## 2017-01-10 DIAGNOSIS — Y929 Unspecified place or not applicable: Secondary | ICD-10-CM | POA: Insufficient documentation

## 2017-01-10 DIAGNOSIS — I10 Essential (primary) hypertension: Secondary | ICD-10-CM | POA: Diagnosis not present

## 2017-01-10 DIAGNOSIS — Y939 Activity, unspecified: Secondary | ICD-10-CM | POA: Diagnosis not present

## 2017-01-10 DIAGNOSIS — M7989 Other specified soft tissue disorders: Secondary | ICD-10-CM

## 2017-01-10 DIAGNOSIS — X509XXA Other and unspecified overexertion or strenuous movements or postures, initial encounter: Secondary | ICD-10-CM | POA: Diagnosis not present

## 2017-01-10 DIAGNOSIS — F17200 Nicotine dependence, unspecified, uncomplicated: Secondary | ICD-10-CM | POA: Diagnosis not present

## 2017-01-10 DIAGNOSIS — M79609 Pain in unspecified limb: Secondary | ICD-10-CM

## 2017-01-10 MED ORDER — OXYCODONE-ACETAMINOPHEN 5-325 MG PO TABS: 1.0000 | ORAL_TABLET | Freq: Three times a day (TID) | ORAL | 0 refills | Status: DC | PRN

## 2017-01-10 MED ORDER — MELOXICAM 15 MG PO TABS: 15.0000 mg | ORAL_TABLET | Freq: Every day | ORAL | 0 refills | Status: DC

## 2017-01-10 MED ORDER — OXYCODONE-ACETAMINOPHEN 5-325 MG PO TABS
1.0000 | ORAL_TABLET | Freq: Once | ORAL | Status: AC
  Administered 2017-01-10: 1 via ORAL
  Filled 2017-01-10: qty 1

## 2017-01-10 NOTE — ED Triage Notes (Signed)
He states he felt a "tearing" at right bicep brachii area some 2 weeks ago while lifting heavy "sound equipment". He states he has seen Dr. Onnie Graham this week for this, however he elucidates no set plan to deal with  This, and he is here with c/o persistent pain. He is in no distress. He also states "I bee eating Ibuprofen like candy" [sic].

## 2017-01-10 NOTE — ED Provider Notes (Signed)
[facility] DEPT Provider Note   CSN: [id number] Arrival date & time: 01/10/17  1321     History   Chief Complaint Chief Complaint  Patient presents with  . Arm Pain    HPI [name] is a 56 y.o. male with a past medical history of hypertension, CAD, who presents to ED for evaluation of right upper arm pain.  He also reports bruising at the site since yesterday.  He states that he suffered a biceps tendon tear earlier in the month after lifting heavy equipment.  He was evaluated by orthopedist and was placed in a sling which she was told to wear intermittently to prevent frozen shoulder.  He states that the pain has progressively gotten worse and he is not had any improvement with ibuprofen.  No additional imaging studies were done other than an x-ray.  He states that his orthopedist told him that he would not benefit from surgery.  He denies any additional injuries, heavy lifting, chest pain, shortness of breath, prior DVT, PE, recent surgeries, recent prolonged travel.  HPI  Past Medical History:  Diagnosis Date  . Coronary artery disease   . Hypertension   . MI, acute, non ST segment elevation ([place])     There are no active problems to display for this patient.   Past Surgical History:  Procedure Laterality Date  . APPENDECTOMY    . EYE SURGERY    . HERNIA REPAIR         Home Medications    Prior to Admission medications   Medication Sig Start Date End Date Taking? Authorizing Provider  meloxicam (MOBIC) 15 MG tablet Take 1 tablet (15 mg total) by mouth daily. 01/10/17   Litzy Dicker, PA-C  oxyCODONE-acetaminophen (PERCOCET/ROXICET) 5-325 MG tablet Take 1 tablet by mouth every 8 (eight) hours as needed for severe pain. 01/10/17   Delia Heady, PA-C    Family History No family history on file.  Social History Social History   Tobacco Use  . Smoking status: Current Every Day Smoker  . Smokeless tobacco: Never Used  Substance  Use Topics  . Alcohol use: No  . Drug use: No     Allergies   Penicillins   Review of Systems Review of Systems  Constitutional: Negative for chills and fever.  Respiratory: Negative for cough and shortness of breath.   Cardiovascular: Negative for chest pain and leg swelling.  Gastrointestinal: Negative for nausea and vomiting.  Musculoskeletal: Positive for arthralgias. Negative for joint swelling and myalgias.  Skin: Positive for color change.  Neurological: Negative for weakness and numbness.     Physical Exam Updated Vital Signs BP (!) 142/79 (BP Location: Left Arm)   Pulse (!) 56   Temp 97.9 F (36.6 C) (Oral)   Resp 17   Ht 5\' 6"  (1.676 m)   Wt 96.2 kg (212 lb)   SpO2 99%   BMI 34.22 kg/m   Physical Exam  Constitutional: He appears well-developed and well-nourished. No distress.  HENT:  Head: Normocephalic and atraumatic.  Eyes: Conjunctivae and EOM are normal. No scleral icterus.  Neck: Normal range of motion.  Pulmonary/Chest: Effort normal. No respiratory distress.  Musculoskeletal: Normal range of motion. He exhibits edema and tenderness. He exhibits no deformity.  Area of erythema noted in the right upper arm near the site of biceps.  Pain surrounding the site of erythema with palpation.  Full active and passive range of motion of shoulder, elbow and wrist, although pain  with abduction of shoulder.  Neurological: He is alert.  Skin: No rash noted. He is not diaphoretic.  Psychiatric: He has a normal mood and affect.  Nursing note and vitals reviewed.    ED Treatments / Results  Labs (all labs ordered are listed, but only abnormal results are displayed) Labs Reviewed - No data to display  EKG  EKG Interpretation None       Radiology No results found.  Procedures Procedures (including critical care time)  Medications Ordered in ED Medications  oxyCODONE-acetaminophen (PERCOCET/ROXICET) 5-325 MG per tablet 1 tablet (1 tablet Oral Given  01/10/17 1420)     Initial Impression / Assessment and Plan / ED Course  I have reviewed the triage vital signs and the nursing notes.  Pertinent labs & imaging results that were available during my care of the patient were reviewed by me and considered in my medical decision making (see chart for details).     Patient presents to ED for evaluation of right upper arm pain for the past 2 weeks as well as bruising that appeared yesterday.  He did have a biceps tendon tear earlier in the month after lifting heavy equipment.  He states that he was evaluated by orthopedics and was placed in a sling which she was told to wear intermittently.  He states that the pain has gotten progressively worse and he has not had any improvement or ibuprofen.  No additional imaging done other than x-ray.  I cannot see any notes from the orthopedist in the system.  There is bruising noted but he denies any chest pain or shortness of breath.  DVT study was done which returned as negative but did show possible tendon tear.  Will give patient short course of narcotic pain medication and advised him to again follow-up with his orthopedist for further evaluation.  I did tell him that he is following the correct directions and that as far as from an emergency department standpoint, there is no further workup needed for him.  [place] narcotic database reviewed with no discrepancies.  Patient appears stable for discharge at this time.  Strict return precautions given.  Final Clinical Impressions(s) / ED Diagnoses   Final diagnoses:  Tear of right biceps muscle, subsequent encounter    ED Discharge Orders        Ordered    oxyCODONE-acetaminophen (PERCOCET/ROXICET) 5-325 MG tablet  Every 8 hours PRN     01/10/17 1535    meloxicam (MOBIC) 15 MG tablet  Daily     01/10/17 [address] PA-C 01/10/17 Motley, Ankit, MD 01/10/17 1627

## 2017-01-10 NOTE — Progress Notes (Signed)
Right upper extremity venous duplex completed. No evidence of DVT or superficial thrombosis. There is an area of mixed echoes with an anechoic space consistent with a possible muscle tear in the mid to upper arm. [facility], RVS 01/10/2017 3:15 pm

## 2017-01-10 NOTE — ED Notes (Signed)
ED Provider at bedside. 

## 2017-01-10 NOTE — Discharge Instructions (Signed)
Please read the attached information regarding shoulder range of motion exercises. Follow-up with orthopedist listed below for further evaluation. Take pain medication as needed for severe pain. Take anti-inflammatories (Mobic) to help with pain and inflammation. Return to ED for additional injury, numbness in arms, chest pain or shortness of breath.

## 2017-01-13 DIAGNOSIS — I2510 Atherosclerotic heart disease of native coronary artery without angina pectoris: Secondary | ICD-10-CM | POA: Diagnosis not present

## 2017-01-13 DIAGNOSIS — S46119A Strain of muscle, fascia and tendon of long head of biceps, unspecified arm, initial encounter: Secondary | ICD-10-CM | POA: Diagnosis not present

## 2017-01-13 DIAGNOSIS — E1165 Type 2 diabetes mellitus with hyperglycemia: Secondary | ICD-10-CM | POA: Diagnosis not present

## 2017-04-03 DIAGNOSIS — E119 Type 2 diabetes mellitus without complications: Secondary | ICD-10-CM | POA: Diagnosis not present

## 2017-04-03 DIAGNOSIS — J101 Influenza due to other identified influenza virus with other respiratory manifestations: Secondary | ICD-10-CM | POA: Diagnosis not present

## 2017-04-03 DIAGNOSIS — G4733 Obstructive sleep apnea (adult) (pediatric): Secondary | ICD-10-CM | POA: Diagnosis not present

## 2017-04-06 DIAGNOSIS — J0100 Acute maxillary sinusitis, unspecified: Secondary | ICD-10-CM | POA: Diagnosis not present

## 2017-04-06 DIAGNOSIS — R0981 Nasal congestion: Secondary | ICD-10-CM | POA: Diagnosis not present

## 2017-04-06 DIAGNOSIS — R05 Cough: Secondary | ICD-10-CM | POA: Diagnosis not present

## 2017-04-06 DIAGNOSIS — I2510 Atherosclerotic heart disease of native coronary artery without angina pectoris: Secondary | ICD-10-CM | POA: Diagnosis not present

## 2017-04-06 DIAGNOSIS — I252 Old myocardial infarction: Secondary | ICD-10-CM | POA: Diagnosis not present

## 2017-04-07 DIAGNOSIS — E7800 Pure hypercholesterolemia, unspecified: Secondary | ICD-10-CM | POA: Diagnosis not present

## 2017-04-08 DIAGNOSIS — N5089 Other specified disorders of the male genital organs: Secondary | ICD-10-CM | POA: Diagnosis not present

## 2017-04-08 DIAGNOSIS — E7800 Pure hypercholesterolemia, unspecified: Secondary | ICD-10-CM | POA: Diagnosis not present

## 2017-04-08 DIAGNOSIS — R21 Rash and other nonspecific skin eruption: Secondary | ICD-10-CM | POA: Diagnosis not present

## 2017-04-08 DIAGNOSIS — L539 Erythematous condition, unspecified: Secondary | ICD-10-CM | POA: Diagnosis not present

## 2017-07-03 DIAGNOSIS — E7800 Pure hypercholesterolemia, unspecified: Secondary | ICD-10-CM | POA: Diagnosis not present

## 2017-07-03 DIAGNOSIS — E119 Type 2 diabetes mellitus without complications: Secondary | ICD-10-CM | POA: Diagnosis not present

## 2017-07-03 DIAGNOSIS — I2510 Atherosclerotic heart disease of native coronary artery without angina pectoris: Secondary | ICD-10-CM | POA: Diagnosis not present

## 2017-07-16 DIAGNOSIS — G4733 Obstructive sleep apnea (adult) (pediatric): Secondary | ICD-10-CM | POA: Diagnosis present

## 2017-07-16 DIAGNOSIS — K219 Gastro-esophageal reflux disease without esophagitis: Secondary | ICD-10-CM | POA: Insufficient documentation

## 2017-07-16 DIAGNOSIS — Z720 Tobacco use: Secondary | ICD-10-CM | POA: Insufficient documentation

## 2017-08-08 DIAGNOSIS — Z800 Family history of malignant neoplasm of digestive organs: Secondary | ICD-10-CM | POA: Diagnosis not present

## 2017-08-08 DIAGNOSIS — Z86010 Personal history of colonic polyps: Secondary | ICD-10-CM | POA: Diagnosis not present

## 2017-09-15 ENCOUNTER — Encounter: Payer: Self-pay | Admitting: *Deleted

## 2017-09-16 ENCOUNTER — Encounter: Payer: Self-pay | Admitting: *Deleted

## 2017-09-16 ENCOUNTER — Ambulatory Visit: Payer: Commercial Managed Care - HMO | Admitting: Anesthesiology

## 2017-09-16 ENCOUNTER — Ambulatory Visit
Admission: RE | Admit: 2017-09-16 | Discharge: 2017-09-16 | Disposition: A | Discharge status: Home / Self Care | Payer: Commercial Managed Care - HMO | Source: Ambulatory Visit | Attending: Internal Medicine | Admitting: Internal Medicine

## 2017-09-16 ENCOUNTER — Encounter
Admission: RE | Disposition: A | Discharge status: Home / Self Care | Payer: Self-pay | Source: Ambulatory Visit | Attending: Internal Medicine

## 2017-09-16 DIAGNOSIS — Z800 Family history of malignant neoplasm of digestive organs: Secondary | ICD-10-CM | POA: Insufficient documentation

## 2017-09-16 DIAGNOSIS — G4730 Sleep apnea, unspecified: Secondary | ICD-10-CM | POA: Diagnosis not present

## 2017-09-16 DIAGNOSIS — I10 Essential (primary) hypertension: Secondary | ICD-10-CM | POA: Insufficient documentation

## 2017-09-16 DIAGNOSIS — K635 Polyp of colon: Secondary | ICD-10-CM | POA: Insufficient documentation

## 2017-09-16 DIAGNOSIS — Z86010 Personal history of colonic polyps: Secondary | ICD-10-CM | POA: Insufficient documentation

## 2017-09-16 DIAGNOSIS — Z7984 Long term (current) use of oral hypoglycemic drugs: Secondary | ICD-10-CM | POA: Insufficient documentation

## 2017-09-16 DIAGNOSIS — Z955 Presence of coronary angioplasty implant and graft: Secondary | ICD-10-CM | POA: Insufficient documentation

## 2017-09-16 DIAGNOSIS — I252 Old myocardial infarction: Secondary | ICD-10-CM | POA: Insufficient documentation

## 2017-09-16 DIAGNOSIS — Z7982 Long term (current) use of aspirin: Secondary | ICD-10-CM | POA: Diagnosis not present

## 2017-09-16 DIAGNOSIS — E119 Type 2 diabetes mellitus without complications: Secondary | ICD-10-CM | POA: Diagnosis not present

## 2017-09-16 DIAGNOSIS — K219 Gastro-esophageal reflux disease without esophagitis: Secondary | ICD-10-CM | POA: Insufficient documentation

## 2017-09-16 DIAGNOSIS — I2510 Atherosclerotic heart disease of native coronary artery without angina pectoris: Secondary | ICD-10-CM | POA: Diagnosis not present

## 2017-09-16 DIAGNOSIS — D120 Benign neoplasm of cecum: Secondary | ICD-10-CM | POA: Diagnosis not present

## 2017-09-16 DIAGNOSIS — K621 Rectal polyp: Secondary | ICD-10-CM | POA: Diagnosis not present

## 2017-09-16 DIAGNOSIS — Z1211 Encounter for screening for malignant neoplasm of colon: Secondary | ICD-10-CM | POA: Diagnosis not present

## 2017-09-16 DIAGNOSIS — E785 Hyperlipidemia, unspecified: Secondary | ICD-10-CM | POA: Insufficient documentation

## 2017-09-16 DIAGNOSIS — K640 First degree hemorrhoids: Secondary | ICD-10-CM | POA: Insufficient documentation

## 2017-09-16 DIAGNOSIS — Z79899 Other long term (current) drug therapy: Secondary | ICD-10-CM | POA: Insufficient documentation

## 2017-09-16 DIAGNOSIS — F17200 Nicotine dependence, unspecified, uncomplicated: Secondary | ICD-10-CM | POA: Insufficient documentation

## 2017-09-16 DIAGNOSIS — Z791 Long term (current) use of non-steroidal anti-inflammatories (NSAID): Secondary | ICD-10-CM | POA: Insufficient documentation

## 2017-09-16 DIAGNOSIS — D128 Benign neoplasm of rectum: Secondary | ICD-10-CM | POA: Diagnosis not present

## 2017-09-16 DIAGNOSIS — D123 Benign neoplasm of transverse colon: Secondary | ICD-10-CM | POA: Diagnosis not present

## 2017-09-16 HISTORY — DX: Sleep apnea, unspecified: G47.30

## 2017-09-16 HISTORY — DX: Non-ST elevation (NSTEMI) myocardial infarction: I21.4

## 2017-09-16 HISTORY — DX: Hyperlipidemia, unspecified: E78.5

## 2017-09-16 HISTORY — DX: Tobacco use: Z72.0

## 2017-09-16 HISTORY — PX: COLONOSCOPY WITH PROPOFOL: SHX5780

## 2017-09-16 HISTORY — DX: Gastro-esophageal reflux disease without esophagitis: K21.9

## 2017-09-16 HISTORY — DX: Type 2 diabetes mellitus without complications: E11.9

## 2017-09-16 LAB — GLUCOSE, CAPILLARY: Glucose-Capillary: 241 mg/dL — ABNORMAL HIGH (ref 70–99)

## 2017-09-16 SURGERY — COLONOSCOPY WITH PROPOFOL
Anesthesia: General

## 2017-09-16 MED ORDER — EPHEDRINE SULFATE 50 MG/ML IJ SOLN
INTRAMUSCULAR | Status: AC
Start: 2017-09-16 — End: ?
  Filled 2017-09-16: qty 1

## 2017-09-16 MED ORDER — PHENYLEPHRINE HCL 10 MG/ML IJ SOLN
INTRAMUSCULAR | Status: AC
  Filled 2017-09-16: qty 1

## 2017-09-16 MED ORDER — FENTANYL CITRATE (PF) 100 MCG/2ML IJ SOLN
INTRAMUSCULAR | Status: DC | PRN
  Administered 2017-09-16: 50 ug via INTRAVENOUS

## 2017-09-16 MED ORDER — MIDAZOLAM HCL 2 MG/2ML IJ SOLN
INTRAMUSCULAR | Status: AC
  Filled 2017-09-16: qty 2

## 2017-09-16 MED ORDER — MIDAZOLAM HCL 2 MG/2ML IJ SOLN
INTRAMUSCULAR | Status: DC | PRN
  Administered 2017-09-16: 2 mg via INTRAVENOUS

## 2017-09-16 MED ORDER — PROPOFOL 500 MG/50ML IV EMUL
INTRAVENOUS | Status: DC | PRN
  Administered 2017-09-16: 120 ug/kg/min via INTRAVENOUS

## 2017-09-16 MED ORDER — EPHEDRINE SULFATE 50 MG/ML IJ SOLN
INTRAMUSCULAR | Status: DC | PRN
  Administered 2017-09-16 (×2): 5 mg via INTRAVENOUS

## 2017-09-16 MED ORDER — FENTANYL CITRATE (PF) 100 MCG/2ML IJ SOLN
INTRAMUSCULAR | Status: AC
  Filled 2017-09-16: qty 2

## 2017-09-16 MED ORDER — SODIUM CHLORIDE 0.9 % IV SOLN
INTRAVENOUS | Status: DC
  Administered 2017-09-16: 1000 mL via INTRAVENOUS

## 2017-09-16 MED ORDER — PHENYLEPHRINE HCL 10 MG/ML IJ SOLN
INTRAMUSCULAR | Status: DC | PRN
  Administered 2017-09-16 (×2): 50 ug via INTRAVENOUS

## 2017-09-16 MED ORDER — PROPOFOL 500 MG/50ML IV EMUL
INTRAVENOUS | Status: AC
  Filled 2017-09-16: qty 50

## 2017-09-16 NOTE — Anesthesia Postprocedure Evaluation (Signed)
Anesthesia Post Note  Patient: [name]  Procedure(s) Performed: COLONOSCOPY WITH PROPOFOL (N/A )  Patient location during evaluation: Endoscopy Anesthesia Type: General Level of consciousness: awake and alert and oriented Pain management: pain level controlled Vital Signs Assessment: post-procedure vital signs reviewed and stable Respiratory status: spontaneous breathing, nonlabored ventilation and respiratory function stable Cardiovascular status: blood pressure returned to baseline and stable Postop Assessment: no signs of nausea or vomiting Anesthetic complications: no     Last Vitals:  Vitals:   09/16/17 0900 09/16/17 0910  BP: 119/81 117/82  Pulse: 72 72  Resp: 16 14  Temp:    SpO2: 100% 99%    Last Pain:  Vitals:   09/16/17 0830  TempSrc: Tympanic  PainSc:                  Weronika Birch

## 2017-09-16 NOTE — Anesthesia Preprocedure Evaluation (Addendum)
Anesthesia Evaluation  Patient identified by MRN, date of birth, ID band Patient awake    Reviewed: Allergy & Precautions, NPO status , Patient's Chart, lab work & pertinent test results  History of Anesthesia Complications Negative for: history of anesthetic complications  Airway Mallampati: III  TM Distance: >3 FB Neck ROM: Full    Dental  (+) Upper Dentures, Lower Dentures   Pulmonary sleep apnea and Continuous Positive Airway Pressure Ventilation , neg COPD, Current Smoker,    breath sounds clear to auscultation- rhonchi (-) wheezing      Cardiovascular hypertension, Pt. on medications + CAD, + Past MI and + Cardiac Stents  (-) CABG  Rhythm:Regular Rate:Normal - Systolic murmurs and - Diastolic murmurs    Neuro/Psych negative neurological ROS  negative psych ROS   GI/Hepatic Neg liver ROS, GERD  ,  Endo/Other  diabetes, Oral Hypoglycemic Agents  Renal/GU negative Renal ROS     Musculoskeletal negative musculoskeletal ROS (+)   Abdominal (+) + obese,   Peds  Hematology negative hematology ROS (+)   Anesthesia Other Findings Past Medical History: No date: Coronary artery disease No date: Diabetes mellitus without complication (HCC) No date: GERD (gastroesophageal reflux disease) No date: Hyperlipidemia No date: Hypertension No date: MI, acute, non ST segment elevation (HCC) No date: NSTEMI (non-ST elevated myocardial infarction) (HCC) No date: Sleep apnea No date: Tobacco abuse   Reproductive/Obstetrics                             Anesthesia Physical Anesthesia Plan  ASA: III  Anesthesia Plan: General   Post-op Pain Management:    Induction: Intravenous  PONV Risk Score and Plan: 0 and Propofol infusion  Airway Management Planned: Natural Airway  Additional Equipment:   Intra-op Plan:   Post-operative Plan:   Informed Consent: I have reviewed the patients History  and Physical, chart, labs and discussed the procedure including the risks, benefits and alternatives for the proposed anesthesia with the patient or authorized representative who has indicated his/her understanding and acceptance.   Dental advisory given  Plan Discussed with: CRNA and Anesthesiologist  Anesthesia Plan Comments:         Anesthesia Quick Evaluation

## 2017-09-16 NOTE — Op Note (Signed)
[facility] Patient Name: [name] Procedure Date: 09/16/2017 8:08 AM MRN: [id number] Account #: [id number] Date of Birth: Sep 02, 1960 Admit Type: Outpatient Age: 57 Room: [facility] ENDO ROOM 2 Gender: Male Note Status: Finalized Procedure:            Colonoscopy Indications:          High risk colon cancer surveillance: Personal history                        of colonic polyps Providers:            Benay Pike. Alice Reichert MD, MD Referring MD:         Cyndi Bender (Referring MD) Medicines:            Propofol per Anesthesia Complications:        No immediate complications. Procedure:            Pre-Anesthesia Assessment:                       - The risks and benefits of the procedure and the                        sedation options and risks were discussed with the                        patient. All questions were answered and informed                        consent was obtained.                       - Patient identification and proposed procedure were                        verified prior to the procedure by the nurse. The                        procedure was verified in the procedure room.                       - ASA Grade Assessment: III - A patient with severe                        systemic disease.                       - After reviewing the risks and benefits, the patient                        was deemed in satisfactory condition to undergo the                        procedure.                       After obtaining informed consent, the colonoscope was                        passed under direct vision. Throughout the procedure,  the patient's blood pressure, pulse, and oxygen                        saturations were monitored continuously. The                        Colonoscope was introduced through the anus and                        advanced to the the cecum, identified by appendiceal                        orifice and  ileocecal valve. The colonoscopy was                        performed without difficulty. The patient tolerated the                        procedure well. The quality of the bowel preparation                        was adequate. The ileocecal valve, appendiceal orifice,                        and rectum were photographed. Findings:      The perianal and digital rectal examinations were normal. Pertinent       negatives include normal sphincter tone and no palpable rectal lesions.      Ten sessile polyps were found in the rectum, sigmoid colon and cecum.       The polyps were 2 to 4 mm in size. These polyps were removed with a       jumbo cold forceps. Resection and retrieval were complete.      A 6 mm polyp was found in the distal transverse colon. The polyp was       sessile. The polyp was removed with a hot snare. Resection and retrieval       were complete.      Non-bleeding internal hemorrhoids were found during retroflexion. The       hemorrhoids were Grade I (internal hemorrhoids that do not prolapse).      The exam was otherwise without abnormality. Impression:           - Ten 2 to 4 mm polyps in the rectum, in the sigmoid                        colon and in the cecum, removed with a jumbo cold                        forceps. Resected and retrieved.                       - One 6 mm polyp in the distal transverse colon,                        removed with a hot snare. Resected and retrieved.                       - Non-bleeding internal hemorrhoids.                       -  The examination was otherwise normal. Recommendation:       - Patient has a contact number available for                        emergencies. The signs and symptoms of potential                        delayed complications were discussed with the patient.                        Return to normal activities tomorrow. Written discharge                        instructions were provided to the patient.                        - Resume previous diet.                       - Continue present medications.                       - Repeat colonoscopy in 3 years for surveillance.                       - The findings and recommendations were discussed with                        the patient and their family. Procedure Code(s):    --- Professional ---                       [phone], Colonoscopy, flexible; with removal of tumor(s),                        polyp(s), or other lesion(s) by snare technique                       45380, 37, Colonoscopy, flexible; with biopsy, single                        or multiple Diagnosis Code(s):    --- Professional ---                       K64.0, First degree hemorrhoids                       D12.3, Benign neoplasm of transverse colon (hepatic                        flexure or splenic flexure)                       D12.0, Benign neoplasm of cecum                       D12.5, Benign neoplasm of sigmoid colon                       K62.1, Rectal polyp                       Z86.010, Personal history of colonic polyps CPT  copyright 2017 American Medical Association. All rights reserved. The codes documented in this report are preliminary and upon coder review may  be revised to meet current compliance requirements. Efrain Sella MD, MD 09/16/2017 8:39:24 AM This report has been signed electronically. Number of Addenda: 0 Note Initiated On: 09/16/2017 8:08 AM Scope Withdrawal Time: 0 hours 14 minutes 23 seconds  Total Procedure Duration: 0 hours 18 minutes 38 seconds       [facility]

## 2017-09-16 NOTE — Interval H&P Note (Signed)
**Note Jeff-Identified via Obfuscation** History and Physical Interval Note:  09/16/2017 7:55 AM  [name]  has presented today for surgery, with the diagnosis of Personal History Colon Polyps; Family History Colon Cancer  The various methods of treatment have been discussed with the patient and family. After consideration of risks, benefits and other options for treatment, the patient has consented to  Procedure(s) with comments: COLONOSCOPY WITH PROPOFOL (N/A) - (+) DM - oral as a surgical intervention .  The patient's history has been reviewed, patient examined, no change in status, stable for surgery.  I have reviewed the patient's chart and labs.  Questions were answered to the patient's satisfaction.     [place], [place]

## 2017-09-16 NOTE — Transfer of Care (Signed)
Immediate Anesthesia Transfer of Care Note  Patient: Lister Brizzi Hawkes  Procedure(s) Performed: COLONOSCOPY WITH PROPOFOL (N/A )  Patient Location: PACU  Anesthesia Type:General  Level of Consciousness: awake and sedated  Airway & Oxygen Therapy: Patient Spontanous Breathing and Patient connected to nasal cannula oxygen  Post-op Assessment: Report given to RN and Post -op Vital signs reviewed and stable  Post vital signs: Reviewed and stable  Last Vitals:  Vitals Value Taken Time  BP    Temp    Pulse    Resp    SpO2      Last Pain:  Vitals:   09/16/17 0751  TempSrc: Tympanic  PainSc: 0-No pain         Complications: No apparent anesthesia complications

## 2017-09-16 NOTE — Anesthesia Post-op Follow-up Note (Signed)
Anesthesia QCDR form completed.        

## 2017-09-16 NOTE — Anesthesia Procedure Notes (Signed)
Performed by: Vaughan Sine Pre-anesthesia Checklist: Emergency Drugs available, Patient identified, Suction available, Patient being monitored and Timeout performed Patient Re-evaluated:Patient Re-evaluated prior to induction Oxygen Delivery Method: Nasal cannula Preoxygenation: Pre-oxygenation with 100% oxygen Induction Type: IV induction Ventilation: Oral airway inserted - appropriate to patient size

## 2017-09-16 NOTE — H&P (Signed)
Outpatient short stay form Pre-procedure 09/16/2017 7:54 AM [name] K. Alice Reichert, M.D.  Primary Physician: Cyndi Bender, PA  Reason for visit:  Personal hx of colon polyps  History of present illness:  Patient with personal hx of colon polyps. Patient denies change in bowel habits, rectal bleeding, weight loss or abdominal pain.     Current Facility-Administered Medications:  .  0.9 %  sodium chloride infusion, , Intravenous, Continuous, Karrington Mccravy, Benay Pike, MD  Medications Prior to Admission  Medication Sig Dispense Refill Last Dose  . aspirin EC 81 MG tablet Take 81 mg by mouth daily.     Marland Kitchen atorvastatin (LIPITOR) 80 MG tablet Take 80 mg by mouth daily.     . canagliflozin (INVOKANA) 100 MG TABS tablet Take by mouth daily before breakfast.     . glipiZIDE (GLUCOTROL) 10 MG tablet Take 10 mg by mouth daily before breakfast.     . lisinopril-hydrochlorothiazide (PRINZIDE,ZESTORETIC) 20-12.5 MG tablet Take 1 tablet by mouth daily.     . metFORMIN (GLUCOPHAGE) 1000 MG tablet Take 1,000 mg by mouth 2 (two) times daily with a meal.   09/15/2017 at Unknown time  . metoprolol tartrate (LOPRESSOR) 25 MG tablet Take 25 mg by mouth 2 (two) times daily.   09/15/2017 at Unknown time  . pioglitazone (ACTOS) 45 MG tablet Take 45 mg by mouth daily.     . ranitidine (ZANTAC) 150 MG tablet Take 150 mg by mouth 2 (two) times daily.     . sitaGLIPtin (JANUVIA) 100 MG tablet Take 100 mg by mouth daily.     . meloxicam (MOBIC) 15 MG tablet Take 1 tablet (15 mg total) by mouth daily. 30 tablet 0      Allergies  Allergen Reactions  . Penicillins Swelling  . Shellfish Allergy      Past Medical History:  Diagnosis Date  . Coronary artery disease   . Diabetes mellitus without complication ([place])   . GERD (gastroesophageal reflux disease)   . Hyperlipidemia   . Hypertension   . MI, acute, non ST segment elevation ([place])   . NSTEMI (non-ST elevated myocardial infarction) ([place])   . Sleep apnea   . Tobacco  abuse     Review of systems:  Otherwise negative.    Physical Exam  Gen: Alert, oriented. Appears stated age.  HEENT: [place]/AT. PERRLA. Lungs: CTA, no wheezes. CV: RR nl S1, S2. Abd: soft, benign, no masses. BS+ Ext: No edema. Pulses 2+    Planned procedures: Proceed with colonoscopy. The patient understands the nature of the planned procedure, indications, risks, alternatives and potential complications including but not limited to bleeding, infection, perforation, damage to internal organs and possible oversedation/side effects from anesthesia. The patient agrees and gives consent to proceed.  Please refer to procedure notes for findings, recommendations and patient disposition/instructions.     Chelsia Serres K. Alice Reichert, M.D. Gastroenterology 09/16/2017  7:54 AM

## 2017-09-17 LAB — SURGICAL PATHOLOGY

## 2017-10-14 DIAGNOSIS — G4733 Obstructive sleep apnea (adult) (pediatric): Secondary | ICD-10-CM | POA: Diagnosis not present

## 2017-10-15 DIAGNOSIS — H1030 Unspecified acute conjunctivitis, unspecified eye: Secondary | ICD-10-CM | POA: Diagnosis not present

## 2017-10-15 DIAGNOSIS — E119 Type 2 diabetes mellitus without complications: Secondary | ICD-10-CM | POA: Diagnosis not present

## 2017-10-15 DIAGNOSIS — I2510 Atherosclerotic heart disease of native coronary artery without angina pectoris: Secondary | ICD-10-CM | POA: Diagnosis not present

## 2017-10-19 DIAGNOSIS — Z880 Allergy status to penicillin: Secondary | ICD-10-CM | POA: Diagnosis not present

## 2017-10-19 DIAGNOSIS — L509 Urticaria, unspecified: Secondary | ICD-10-CM | POA: Diagnosis not present

## 2017-10-19 DIAGNOSIS — R21 Rash and other nonspecific skin eruption: Secondary | ICD-10-CM | POA: Diagnosis not present

## 2017-10-21 DIAGNOSIS — I10 Essential (primary) hypertension: Secondary | ICD-10-CM | POA: Diagnosis not present

## 2017-10-21 DIAGNOSIS — R21 Rash and other nonspecific skin eruption: Secondary | ICD-10-CM | POA: Diagnosis not present

## 2017-10-21 DIAGNOSIS — Z6833 Body mass index (BMI) 33.0-33.9, adult: Secondary | ICD-10-CM | POA: Diagnosis not present

## 2017-10-21 DIAGNOSIS — L539 Erythematous condition, unspecified: Secondary | ICD-10-CM | POA: Diagnosis not present

## 2017-10-21 DIAGNOSIS — E119 Type 2 diabetes mellitus without complications: Secondary | ICD-10-CM | POA: Diagnosis not present

## 2017-11-26 DIAGNOSIS — I25118 Atherosclerotic heart disease of native coronary artery with other forms of angina pectoris: Secondary | ICD-10-CM | POA: Diagnosis not present

## 2017-11-26 DIAGNOSIS — E785 Hyperlipidemia, unspecified: Secondary | ICD-10-CM | POA: Diagnosis not present

## 2017-11-26 DIAGNOSIS — I10 Essential (primary) hypertension: Secondary | ICD-10-CM | POA: Diagnosis not present

## 2018-01-12 DIAGNOSIS — G4733 Obstructive sleep apnea (adult) (pediatric): Secondary | ICD-10-CM | POA: Diagnosis not present

## 2018-03-03 DIAGNOSIS — H6503 Acute serous otitis media, bilateral: Secondary | ICD-10-CM | POA: Diagnosis not present

## 2018-03-03 DIAGNOSIS — J3489 Other specified disorders of nose and nasal sinuses: Secondary | ICD-10-CM | POA: Diagnosis not present

## 2018-05-11 DIAGNOSIS — I2510 Atherosclerotic heart disease of native coronary artery without angina pectoris: Secondary | ICD-10-CM | POA: Diagnosis not present

## 2018-05-11 DIAGNOSIS — I10 Essential (primary) hypertension: Secondary | ICD-10-CM | POA: Diagnosis not present

## 2018-05-11 DIAGNOSIS — E119 Type 2 diabetes mellitus without complications: Secondary | ICD-10-CM | POA: Diagnosis not present

## 2018-07-14 DIAGNOSIS — G4733 Obstructive sleep apnea (adult) (pediatric): Secondary | ICD-10-CM | POA: Diagnosis not present

## 2019-05-09 ENCOUNTER — Other Ambulatory Visit: Payer: Self-pay

## 2019-05-09 ENCOUNTER — Ambulatory Visit: Payer: 59 | Attending: Internal Medicine

## 2019-05-09 DIAGNOSIS — Z23 Encounter for immunization: Secondary | ICD-10-CM

## 2019-05-09 NOTE — Progress Notes (Signed)
   Covid-19 Vaccination Clinic  Name:  [name]    MRN: [id number] DOB: 15-Jun-1960  05/09/2019  [name] was observed post Covid-19 immunization for 15 minutes without incident. He was provided with Vaccine Information Sheet and instruction to access the V-Safe system.   [name] was instructed to call 911 with any severe reactions post vaccine: Marland Kitchen Difficulty breathing  . Swelling of face and throat  . A fast heartbeat  . A bad rash all over body  . Dizziness and weakness   Immunizations Administered    Name Date Dose VIS Date Route   Pfizer COVID-19 Vaccine 05/09/2019  3:47 PM 0.3 mL 01/29/2019 Intramuscular   Manufacturer: [place]   Lot: [id number]   [place]: [address]-1

## 2019-05-29 ENCOUNTER — Ambulatory Visit: Payer: Self-pay

## 2019-05-30 ENCOUNTER — Other Ambulatory Visit: Payer: Self-pay

## 2019-05-30 ENCOUNTER — Ambulatory Visit: Payer: 59 | Attending: Internal Medicine

## 2019-05-30 DIAGNOSIS — Z23 Encounter for immunization: Secondary | ICD-10-CM

## 2019-05-30 NOTE — Progress Notes (Signed)
   Covid-19 Vaccination Clinic  Name:  [name]    MRN: [id number] DOB: 1960-03-23  05/30/2019  [name] was observed post Covid-19 immunization for 15 minutes without incident. He was provided with Vaccine Information Sheet and instruction to access the V-Safe system.   [name] was instructed to call 911 with any severe reactions post vaccine: Marland Kitchen Difficulty breathing  . Swelling of face and throat  . A fast heartbeat  . A bad rash all over body  . Dizziness and weakness   Immunizations Administered    Name Date Dose VIS Date Route   Pfizer COVID-19 Vaccine 05/30/2019  3:13 PM 0.3 mL 01/29/2019 Intramuscular   Manufacturer: [place]   Lot: [id number]   [place]: [id number]

## 2019-08-19 DIAGNOSIS — I214 Non-ST elevation (NSTEMI) myocardial infarction: Secondary | ICD-10-CM

## 2019-08-19 HISTORY — DX: Non-ST elevation (NSTEMI) myocardial infarction: I21.4

## 2019-08-30 ENCOUNTER — Emergency Department
Admission: EM | Admit: 2019-08-30 | Discharge: 2019-08-30 | Disposition: A | Discharge status: Home / Self Care | Payer: 59 | Attending: Emergency Medicine | Admitting: Emergency Medicine

## 2019-08-30 ENCOUNTER — Encounter: Payer: Self-pay | Admitting: Emergency Medicine

## 2019-08-30 ENCOUNTER — Emergency Department: Payer: 59

## 2019-08-30 ENCOUNTER — Other Ambulatory Visit: Payer: Self-pay

## 2019-08-30 DIAGNOSIS — I252 Old myocardial infarction: Secondary | ICD-10-CM | POA: Insufficient documentation

## 2019-08-30 DIAGNOSIS — I208 Other forms of angina pectoris: Secondary | ICD-10-CM | POA: Insufficient documentation

## 2019-08-30 DIAGNOSIS — Z955 Presence of coronary angioplasty implant and graft: Secondary | ICD-10-CM | POA: Insufficient documentation

## 2019-08-30 DIAGNOSIS — Z7984 Long term (current) use of oral hypoglycemic drugs: Secondary | ICD-10-CM | POA: Insufficient documentation

## 2019-08-30 DIAGNOSIS — Z7982 Long term (current) use of aspirin: Secondary | ICD-10-CM | POA: Insufficient documentation

## 2019-08-30 DIAGNOSIS — I10 Essential (primary) hypertension: Secondary | ICD-10-CM | POA: Insufficient documentation

## 2019-08-30 DIAGNOSIS — F17210 Nicotine dependence, cigarettes, uncomplicated: Secondary | ICD-10-CM | POA: Diagnosis not present

## 2019-08-30 DIAGNOSIS — E119 Type 2 diabetes mellitus without complications: Secondary | ICD-10-CM | POA: Diagnosis not present

## 2019-08-30 DIAGNOSIS — R0789 Other chest pain: Secondary | ICD-10-CM | POA: Diagnosis present

## 2019-08-30 DIAGNOSIS — Z79899 Other long term (current) drug therapy: Secondary | ICD-10-CM | POA: Diagnosis not present

## 2019-08-30 LAB — CBC
HCT: 40.1 % (ref 39.0–52.0)
Hemoglobin: 14.3 g/dL (ref 13.0–17.0)
MCH: 32 pg (ref 26.0–34.0)
MCHC: 35.7 g/dL (ref 30.0–36.0)
MCV: 89.7 fL (ref 80.0–100.0)
Platelets: 225 10*3/uL (ref 150–400)
RBC: 4.47 MIL/uL (ref 4.22–5.81)
RDW: 12.3 % (ref 11.5–15.5)
WBC: 9.9 10*3/uL (ref 4.0–10.5)
nRBC: 0 % (ref 0.0–0.2)

## 2019-08-30 LAB — TROPONIN I (HIGH SENSITIVITY): Troponin I (High Sensitivity): 6 ng/L (ref <18)

## 2019-08-30 LAB — BASIC METABOLIC PANEL
Anion gap: 12 (ref 5–15)
BUN: 21 mg/dL — ABNORMAL HIGH (ref 6–20)
CO2: 23 mmol/L (ref 22–32)
Calcium: 9.3 mg/dL (ref 8.9–10.3)
Chloride: 97 mmol/L — ABNORMAL LOW (ref 98–111)
Creatinine, Ser: 1.61 mg/dL — ABNORMAL HIGH (ref 0.61–1.24)
GFR calc Af Amer: 53 mL/min — ABNORMAL LOW (ref >60)
GFR calc non Af Amer: 46 mL/min — ABNORMAL LOW (ref >60)
Glucose, Bld: 372 mg/dL — ABNORMAL HIGH (ref 70–99)
Potassium: 4.6 mmol/L (ref 3.5–5.1)
Sodium: 132 mmol/L — ABNORMAL LOW (ref 135–145)

## 2019-08-30 NOTE — ED Triage Notes (Signed)
Pt in via POV, reports ongoing chest pain x approximately 2 weeks, worsening over the last few days w/ associated SOB.  Ambulatory to triage, vitals WDL, NAD noted at this time.

## 2019-08-30 NOTE — ED Provider Notes (Signed)
[facility] Emergency Department Provider Note   ____________________________________________    I have reviewed the triage vital signs and the nursing notes.   HISTORY  Chief Complaint Chest Pain     HPI [name] is a 59 y.o. male with history of coronary artery disease, diabetes, hypertension with a history of stent placement 20 years ago presents today with 3 weeks of intermittent chest discomfort.  Patient describes exertional chest tightness with some radiation to the jaw.  Currently he is asymptomatic.  He reports he had cardiology follow-up in 2 days.  When he spoke with clinic staff to confirm appointment they recommended he come to the ED if he was having chest pain.  Has not taken anything for this although does have nitroglycerin at home.  No resting pain.  Past Medical History:  Diagnosis Date  . Coronary artery disease   . Diabetes mellitus without complication ([place])   . GERD (gastroesophageal reflux disease)   . Hyperlipidemia   . Hypertension   . MI, acute, non ST segment elevation ([place])   . NSTEMI (non-ST elevated myocardial infarction) ([place])   . Sleep apnea   . Tobacco abuse     There are no problems to display for this patient.   Past Surgical History:  Procedure Laterality Date  . APPENDECTOMY    . CARDIAC CATHETERIZATION    . COLONOSCOPY    . COLONOSCOPY WITH PROPOFOL N/A 09/16/2017   Procedure: COLONOSCOPY WITH PROPOFOL;  Surgeon: Toledo, Benay Pike, MD;  Location: [facility] ENDOSCOPY;  Service: Gastroenterology;  Laterality: N/A;  (+) DM - oral  . EXPLORATORY LAPAROTOMY    . EYE SURGERY    . HERNIA REPAIR      Prior to Admission medications   Medication Sig Start Date End Date Taking? Authorizing Provider  aspirin EC 81 MG tablet Take 81 mg by mouth daily.    [provider]  atorvastatin (LIPITOR) 80 MG tablet Take 80 mg by mouth daily.    [provider]  canagliflozin (INVOKANA) 100 MG TABS  tablet Take by mouth daily before breakfast.    [provider]  glipiZIDE (GLUCOTROL) 10 MG tablet Take 10 mg by mouth daily before breakfast.    [provider]  lisinopril-hydrochlorothiazide (PRINZIDE,ZESTORETIC) 20-12.5 MG tablet Take 1 tablet by mouth daily.    [provider]  meloxicam (MOBIC) 15 MG tablet Take 1 tablet (15 mg total) by mouth daily. 01/10/17   Khatri, Hina, PA-C  metFORMIN (GLUCOPHAGE) 1000 MG tablet Take 1,000 mg by mouth 2 (two) times daily with a meal.    [provider]  metoprolol tartrate (LOPRESSOR) 25 MG tablet Take 25 mg by mouth 2 (two) times daily.    [provider]  pioglitazone (ACTOS) 45 MG tablet Take 45 mg by mouth daily.    [provider]  ranitidine (ZANTAC) 150 MG tablet Take 150 mg by mouth 2 (two) times daily.    [provider]  sitaGLIPtin (JANUVIA) 100 MG tablet Take 100 mg by mouth daily.    [provider]     Allergies Shellfish allergy and Penicillins  Family History  Problem Relation Age of Onset  . Heart disease Father   . Heart attack Father   . Hypertension Father   . Diabetes Father     Social History Social History   Tobacco Use  . Smoking status: Current Every Day Smoker    Packs/day: 1.00    Types: Cigarettes  .  Smokeless tobacco: Never Used  Vaping Use  . Vaping Use: Never used  Substance Use Topics  . Alcohol use: No  . Drug use: No    Review of Systems  Constitutional: No fever/chills Eyes: No visual changes.  ENT: No sore throat. Cardiovascular: As above Respiratory: Denies shortness of breath. Gastrointestinal: No abdominal pain.  Genitourinary: Negative for dysuria. Musculoskeletal: Negative for back pain. Skin: Negative for rash. Neurological: Negative for headaches or weakness   ____________________________________________   PHYSICAL EXAM:  VITAL SIGNS: ED Triage Vitals  Enc Vitals Group     BP 08/30/19 1527 (!)  135/94     Pulse Rate 08/30/19 1527 81     Resp 08/30/19 1527 16     Temp 08/30/19 1527 98.1 F (36.7 C)     Temp Source 08/30/19 1527 Oral     SpO2 08/30/19 1527 99 %     Weight 08/30/19 1528 99.8 kg (220 lb)     Height 08/30/19 1528 1.676 m (5\' 6" )     Head Circumference --      Peak Flow --      Pain Score 08/30/19 1528 5     Pain Loc --      Pain Edu? --      Excl. in [place]? --     Constitutional: Alert and oriented  Nose: No congestion/rhinnorhea. Mouth/Throat: Mucous membranes are moist.    Cardiovascular: Normal rate, regular rhythm Good peripheral circulation. Respiratory: Normal respiratory effort.  No retractions. Lungs CTAB. Gastrointestinal: Soft and nontender. No distention.  No CVA tenderness.  Musculoskeletal:  Warm and well perfused Neurologic:  Normal speech and language. No gross focal neurologic deficits are appreciated.  Skin:  Skin is warm, dry and intact. No rash noted. Psychiatric: Mood and affect are normal. Speech and behavior are normal.  ____________________________________________   LABS (all labs ordered are listed, but only abnormal results are displayed)  Labs Reviewed  BASIC METABOLIC PANEL - Abnormal; Notable for the following components:      Result Value   Sodium 132 (*)    Chloride 97 (*)    Glucose, Bld 372 (*)    BUN 21 (*)    Creatinine, Ser 1.61 (*)    GFR calc non Af Amer 46 (*)    GFR calc Af Amer 53 (*)    All other components within normal limits  CBC  TROPONIN I (HIGH SENSITIVITY)  TROPONIN I (HIGH SENSITIVITY)   ____________________________________________  EKG  ED ECG REPORT I, Lavonia Drafts, the attending physician, personally viewed and interpreted this ECG.  Date: 08/30/2019  Rhythm: normal sinus rhythm QRS Axis: Abnormal Intervals: normal ST/T Wave abnormalities: normal Narrative Interpretation: no evidence of acute ischemia  ____________________________________________  RADIOLOGY  Chest x-ray  reviewed by me, no pneumothorax edema or infiltrate unremarkable ____________________________________________   PROCEDURES  Procedure(s) performed: No  Procedures   Critical Care performed: No ____________________________________________   INITIAL IMPRESSION / ASSESSMENT AND PLAN / ED COURSE  Pertinent labs & imaging results that were available during my care of the patient were reviewed by me and considered in my medical decision making (see chart for details).  Patient presents with exertional chest tightness as described above.  Differential includes angina, ACS less likely CHF/pneumonia  EKG is overall reassuring, troponin is unremarkable today.  Chest x-ray is clear  Symptoms most consistent with exertional angina.  Discussed with Dr. Clayborn Bigness the patient's cardiologist who states he will see the patient in the morning in his office  Discussed this with patient, and offered admission however patient is comfortable with discharge and close outpatient follow-up    ____________________________________________   FINAL CLINICAL IMPRESSION(S) / ED DIAGNOSES  Final diagnoses:  Angina of effort [facility])        Note:  This document was prepared using Dragon voice recognition software and may include unintentional dictation errors.   Lavonia Drafts, MD 08/30/19 2147

## 2019-08-30 NOTE — ED Triage Notes (Signed)
First Nurse Note:  Arries with c/o chest pain x 2 weeks.  Patient is AAOx3.  Skin warm and dry. NAD

## 2019-08-31 ENCOUNTER — Observation Stay
Admission: EM | Admit: 2019-08-31 | Discharge: 2019-09-01 | Disposition: A | Discharge status: Home / Self Care | Payer: 59 | Attending: Internal Medicine | Admitting: Internal Medicine

## 2019-08-31 ENCOUNTER — Encounter: Payer: Self-pay | Admitting: Emergency Medicine

## 2019-08-31 ENCOUNTER — Encounter
Admission: EM | Disposition: A | Discharge status: Home / Self Care | Payer: Self-pay | Source: Home / Self Care | Attending: Student in an Organized Health Care Education/Training Program

## 2019-08-31 ENCOUNTER — Observation Stay
Admit: 2019-08-31 | Discharge: 2019-08-31 | Disposition: A | Discharge status: Home / Self Care | Payer: 59 | Attending: Internal Medicine | Admitting: Internal Medicine

## 2019-08-31 ENCOUNTER — Other Ambulatory Visit: Payer: Self-pay

## 2019-08-31 DIAGNOSIS — I252 Old myocardial infarction: Secondary | ICD-10-CM | POA: Diagnosis not present

## 2019-08-31 DIAGNOSIS — Z20822 Contact with and (suspected) exposure to covid-19: Secondary | ICD-10-CM | POA: Diagnosis not present

## 2019-08-31 DIAGNOSIS — E1165 Type 2 diabetes mellitus with hyperglycemia: Secondary | ICD-10-CM | POA: Diagnosis not present

## 2019-08-31 DIAGNOSIS — I200 Unstable angina: Secondary | ICD-10-CM | POA: Diagnosis not present

## 2019-08-31 DIAGNOSIS — I10 Essential (primary) hypertension: Secondary | ICD-10-CM | POA: Insufficient documentation

## 2019-08-31 DIAGNOSIS — Z959 Presence of cardiac and vascular implant and graft, unspecified: Secondary | ICD-10-CM | POA: Diagnosis not present

## 2019-08-31 DIAGNOSIS — G4730 Sleep apnea, unspecified: Secondary | ICD-10-CM | POA: Diagnosis not present

## 2019-08-31 DIAGNOSIS — F17210 Nicotine dependence, cigarettes, uncomplicated: Secondary | ICD-10-CM

## 2019-08-31 DIAGNOSIS — K219 Gastro-esophageal reflux disease without esophagitis: Secondary | ICD-10-CM | POA: Diagnosis not present

## 2019-08-31 DIAGNOSIS — Z7984 Long term (current) use of oral hypoglycemic drugs: Secondary | ICD-10-CM | POA: Insufficient documentation

## 2019-08-31 DIAGNOSIS — I25110 Atherosclerotic heart disease of native coronary artery with unstable angina pectoris: Secondary | ICD-10-CM | POA: Diagnosis not present

## 2019-08-31 DIAGNOSIS — I2510 Atherosclerotic heart disease of native coronary artery without angina pectoris: Secondary | ICD-10-CM | POA: Diagnosis present

## 2019-08-31 DIAGNOSIS — E785 Hyperlipidemia, unspecified: Secondary | ICD-10-CM | POA: Diagnosis not present

## 2019-08-31 DIAGNOSIS — R0789 Other chest pain: Secondary | ICD-10-CM | POA: Diagnosis present

## 2019-08-31 DIAGNOSIS — G4733 Obstructive sleep apnea (adult) (pediatric): Secondary | ICD-10-CM | POA: Diagnosis present

## 2019-08-31 DIAGNOSIS — Z79899 Other long term (current) drug therapy: Secondary | ICD-10-CM | POA: Diagnosis not present

## 2019-08-31 DIAGNOSIS — F17200 Nicotine dependence, unspecified, uncomplicated: Secondary | ICD-10-CM | POA: Diagnosis present

## 2019-08-31 HISTORY — PX: CORONARY STENT INTERVENTION: CATH118234

## 2019-08-31 HISTORY — PX: LEFT HEART CATH AND CORONARY ANGIOGRAPHY: CATH118249

## 2019-08-31 LAB — HEMOGLOBIN A1C
Hgb A1c MFr Bld: 12.1 % — ABNORMAL HIGH (ref 4.8–5.6)
Mean Plasma Glucose: 300.57 mg/dL

## 2019-08-31 LAB — TROPONIN I (HIGH SENSITIVITY)
Troponin I (High Sensitivity): 6 ng/L (ref <18)
Troponin I (High Sensitivity): 7 ng/L (ref <18)
Troponin I (High Sensitivity): 21 ng/L — ABNORMAL HIGH (ref <18)

## 2019-08-31 LAB — HEPARIN LEVEL (UNFRACTIONATED): Heparin Unfractionated: <0.1 IU/mL — ABNORMAL LOW (ref 0.30–0.70)

## 2019-08-31 LAB — GLUCOSE, CAPILLARY
Glucose-Capillary: 357 mg/dL — ABNORMAL HIGH (ref 70–99)
Glucose-Capillary: 326 mg/dL — ABNORMAL HIGH (ref 70–99)
Glucose-Capillary: 230 mg/dL — ABNORMAL HIGH (ref 70–99)
Glucose-Capillary: 332 mg/dL — ABNORMAL HIGH (ref 70–99)
Glucose-Capillary: 381 mg/dL — ABNORMAL HIGH (ref 70–99)

## 2019-08-31 LAB — CBC
HCT: 40.7 % (ref 39.0–52.0)
Hemoglobin: 14.3 g/dL (ref 13.0–17.0)
MCH: 31.6 pg (ref 26.0–34.0)
MCHC: 35.1 g/dL (ref 30.0–36.0)
MCV: 90 fL (ref 80.0–100.0)
Platelets: 219 10*3/uL (ref 150–400)
RBC: 4.52 MIL/uL (ref 4.22–5.81)
RDW: 12.4 % (ref 11.5–15.5)
WBC: 9.3 10*3/uL (ref 4.0–10.5)
nRBC: 0 % (ref 0.0–0.2)

## 2019-08-31 LAB — BASIC METABOLIC PANEL
Anion gap: 13 (ref 5–15)
BUN: 22 mg/dL — ABNORMAL HIGH (ref 6–20)
CO2: 18 mmol/L — ABNORMAL LOW (ref 22–32)
Calcium: 9.1 mg/dL (ref 8.9–10.3)
Chloride: 99 mmol/L (ref 98–111)
Creatinine, Ser: 1.54 mg/dL — ABNORMAL HIGH (ref 0.61–1.24)
GFR calc Af Amer: 56 mL/min — ABNORMAL LOW (ref >60)
GFR calc non Af Amer: 48 mL/min — ABNORMAL LOW (ref >60)
Glucose, Bld: 451 mg/dL — ABNORMAL HIGH (ref 70–99)
Potassium: 4.6 mmol/L (ref 3.5–5.1)
Sodium: 130 mmol/L — ABNORMAL LOW (ref 135–145)

## 2019-08-31 LAB — PROTIME-INR
INR: 0.9 (ref 0.8–1.2)
Prothrombin Time: 11.8 seconds (ref 11.4–15.2)

## 2019-08-31 LAB — ECHOCARDIOGRAM COMPLETE
Height: 66 in
Weight: 3519.99 oz

## 2019-08-31 LAB — SARS CORONAVIRUS 2 BY RT PCR (HOSPITAL ORDER, PERFORMED IN ~~LOC~~ HOSPITAL LAB): SARS Coronavirus 2: NEGATIVE

## 2019-08-31 LAB — APTT: aPTT: 26 seconds (ref 24–36)

## 2019-08-31 LAB — POCT ACTIVATED CLOTTING TIME: Activated Clotting Time: 379 seconds

## 2019-08-31 LAB — HIV ANTIBODY (ROUTINE TESTING W REFLEX): HIV Screen 4th Generation wRfx: NONREACTIVE

## 2019-08-31 SURGERY — LEFT HEART CATH AND CORONARY ANGIOGRAPHY
Anesthesia: Moderate Sedation

## 2019-08-31 MED ORDER — SODIUM CHLORIDE 0.9 % IV SOLN: INTRAVENOUS | Status: DC

## 2019-08-31 MED ORDER — ACETAMINOPHEN 325 MG PO TABS: 650.0000 mg | ORAL_TABLET | ORAL | Status: DC | PRN

## 2019-08-31 MED ORDER — NITROGLYCERIN 0.4 MG SL SUBL
0.4000 mg | SUBLINGUAL_TABLET | SUBLINGUAL | Status: DC | PRN
  Administered 2019-08-31: 0.4 mg via SUBLINGUAL

## 2019-08-31 MED ORDER — NITROGLYCERIN 2 % TD OINT
1.0000 [in_us] | TOPICAL_OINTMENT | Freq: Three times a day (TID) | TRANSDERMAL | Status: DC
  Administered 2019-08-31 (×2): 1 [in_us] via TOPICAL
  Filled 2019-08-31: qty 1

## 2019-08-31 MED ORDER — SODIUM CHLORIDE 0.9% FLUSH
3.0000 mL | Freq: Two times a day (BID) | INTRAVENOUS | Status: DC
  Administered 2019-09-01: 3 mL via INTRAVENOUS

## 2019-08-31 MED ORDER — INSULIN ASPART 100 UNIT/ML ~~LOC~~ SOLN
0.0000 [IU] | SUBCUTANEOUS | Status: DC
  Administered 2019-08-31: 8 [IU] via SUBCUTANEOUS
  Administered 2019-08-31: 20 [IU] via SUBCUTANEOUS
  Filled 2019-08-31 (×2): qty 1

## 2019-08-31 MED ORDER — NITROGLYCERIN 2 % TD OINT
TOPICAL_OINTMENT | TRANSDERMAL | Status: AC
  Filled 2019-08-31: qty 1

## 2019-08-31 MED ORDER — LISINOPRIL-HYDROCHLOROTHIAZIDE 20-12.5 MG PO TABS: 1.0000 | ORAL_TABLET | Freq: Every day | ORAL | Status: DC

## 2019-08-31 MED ORDER — ASPIRIN 81 MG PO CHEW
CHEWABLE_TABLET | ORAL | Status: DC | PRN
  Administered 2019-08-31: 243 mg via ORAL

## 2019-08-31 MED ORDER — FENTANYL CITRATE (PF) 100 MCG/2ML IJ SOLN
INTRAMUSCULAR | Status: AC
  Filled 2019-08-31: qty 2

## 2019-08-31 MED ORDER — ONDANSETRON HCL 4 MG/2ML IJ SOLN: 4.0000 mg | Freq: Four times a day (QID) | INTRAMUSCULAR | Status: DC | PRN

## 2019-08-31 MED ORDER — SODIUM CHLORIDE 0.9 % WEIGHT BASED INFUSION
3.0000 mL/kg/h | INTRAVENOUS | Status: DC
  Administered 2019-08-31: 3 mL/kg/h via INTRAVENOUS

## 2019-08-31 MED ORDER — MIDAZOLAM HCL 2 MG/2ML IJ SOLN
INTRAMUSCULAR | Status: AC
  Filled 2019-08-31: qty 2

## 2019-08-31 MED ORDER — HYDRALAZINE HCL 20 MG/ML IJ SOLN: 10.0000 mg | INTRAMUSCULAR | Status: AC | PRN

## 2019-08-31 MED ORDER — SODIUM CHLORIDE 0.9 % IV SOLN: 250.0000 mL | INTRAVENOUS | Status: DC | PRN

## 2019-08-31 MED ORDER — SODIUM CHLORIDE 0.9% FLUSH: 3.0000 mL | INTRAVENOUS | Status: DC | PRN

## 2019-08-31 MED ORDER — NITROGLYCERIN 0.4 MG SL SUBL
SUBLINGUAL_TABLET | SUBLINGUAL | Status: AC
  Filled 2019-08-31: qty 1

## 2019-08-31 MED ORDER — BIVALIRUDIN BOLUS VIA INFUSION - CUPID
INTRAVENOUS | Status: DC | PRN
  Administered 2019-08-31: 74.85 mg via INTRAVENOUS

## 2019-08-31 MED ORDER — ASPIRIN EC 81 MG PO TBEC
81.0000 mg | DELAYED_RELEASE_TABLET | Freq: Every day | ORAL | Status: DC
  Administered 2019-08-31: 81 mg via ORAL

## 2019-08-31 MED ORDER — BIVALIRUDIN TRIFLUOROACETATE 250 MG IV SOLR
INTRAVENOUS | Status: AC
  Filled 2019-08-31: qty 250

## 2019-08-31 MED ORDER — ASPIRIN 81 MG PO CHEW: 81.0000 mg | CHEWABLE_TABLET | ORAL | Status: DC

## 2019-08-31 MED ORDER — TICAGRELOR 90 MG PO TABS
ORAL_TABLET | ORAL | Status: DC | PRN
  Administered 2019-08-31: 180 mg via ORAL

## 2019-08-31 MED ORDER — HEPARIN (PORCINE) 25000 UT/250ML-% IV SOLN
1050.0000 [IU]/h | INTRAVENOUS | Status: DC
  Administered 2019-08-31: 1050 [IU]/h via INTRAVENOUS
  Filled 2019-08-31: qty 250

## 2019-08-31 MED ORDER — SODIUM CHLORIDE 0.9% FLUSH: 3.0000 mL | Freq: Two times a day (BID) | INTRAVENOUS | Status: DC

## 2019-08-31 MED ORDER — LABETALOL HCL 5 MG/ML IV SOLN: 10.0000 mg | INTRAVENOUS | Status: AC | PRN

## 2019-08-31 MED ORDER — MIDAZOLAM HCL 2 MG/2ML IJ SOLN
INTRAMUSCULAR | Status: DC | PRN
  Administered 2019-08-31: 0.5 mg via INTRAVENOUS
  Administered 2019-08-31: 1 mg via INTRAVENOUS

## 2019-08-31 MED ORDER — FAMOTIDINE 20 MG PO TABS
20.0000 mg | ORAL_TABLET | Freq: Two times a day (BID) | ORAL | Status: DC
  Administered 2019-08-31 – 2019-09-01 (×2): 20 mg via ORAL
  Filled 2019-08-31 (×2): qty 1

## 2019-08-31 MED ORDER — ATORVASTATIN CALCIUM 80 MG PO TABS
80.0000 mg | ORAL_TABLET | Freq: Every day | ORAL | Status: DC
  Administered 2019-09-01: 80 mg via ORAL
  Filled 2019-08-31: qty 1

## 2019-08-31 MED ORDER — SODIUM CHLORIDE 0.9 % WEIGHT BASED INFUSION: 1.0000 mL/kg/h | INTRAVENOUS | Status: AC

## 2019-08-31 MED ORDER — SODIUM CHLORIDE 0.9 % IV BOLUS: 250.0000 mL | Freq: Once | INTRAVENOUS | Status: DC

## 2019-08-31 MED ORDER — INSULIN ASPART 100 UNIT/ML ~~LOC~~ SOLN
0.0000 [IU] | Freq: Three times a day (TID) | SUBCUTANEOUS | Status: DC
  Administered 2019-09-01 (×2): 5 [IU] via SUBCUTANEOUS
  Filled 2019-08-31 (×2): qty 1

## 2019-08-31 MED ORDER — HEPARIN (PORCINE) IN NACL 1000-0.9 UT/500ML-% IV SOLN
INTRAVENOUS | Status: AC
  Filled 2019-08-31: qty 1000

## 2019-08-31 MED ORDER — ASPIRIN EC 81 MG PO TBEC: 81.0000 mg | DELAYED_RELEASE_TABLET | Freq: Every day | ORAL | Status: DC

## 2019-08-31 MED ORDER — FENTANYL CITRATE (PF) 100 MCG/2ML IJ SOLN
INTRAMUSCULAR | Status: DC | PRN
  Administered 2019-08-31: 25 ug via INTRAVENOUS
  Administered 2019-08-31: 50 ug via INTRAVENOUS

## 2019-08-31 MED ORDER — ASPIRIN 81 MG PO CHEW
81.0000 mg | CHEWABLE_TABLET | Freq: Every day | ORAL | Status: DC
  Administered 2019-09-01: 81 mg via ORAL
  Filled 2019-08-31: qty 1

## 2019-08-31 MED ORDER — HEPARIN (PORCINE) IN NACL 1000-0.9 UT/500ML-% IV SOLN
INTRAVENOUS | Status: DC | PRN
  Administered 2019-08-31: 500 mL

## 2019-08-31 MED ORDER — IOHEXOL 300 MG/ML  SOLN
INTRAMUSCULAR | Status: DC | PRN
  Administered 2019-08-31: 235 mL

## 2019-08-31 MED ORDER — PERFLUTREN LIPID MICROSPHERE
1.0000 mL | INTRAVENOUS | Status: AC | PRN
  Administered 2019-08-31: 2 mL via INTRAVENOUS
  Filled 2019-08-31: qty 10

## 2019-08-31 MED ORDER — HEPARIN BOLUS VIA INFUSION
4000.0000 [IU] | Freq: Once | INTRAVENOUS | Status: AC
  Administered 2019-08-31: 4000 [IU] via INTRAVENOUS
  Filled 2019-08-31: qty 4000

## 2019-08-31 MED ORDER — SODIUM CHLORIDE 0.9 % IV SOLN
INTRAVENOUS | Status: AC | PRN
  Administered 2019-08-31: 1.75 mg/kg/h via INTRAVENOUS

## 2019-08-31 MED ORDER — SODIUM CHLORIDE 0.9 % WEIGHT BASED INFUSION: 1.0000 mL/kg/h | INTRAVENOUS | Status: DC

## 2019-08-31 MED ORDER — INSULIN ASPART 100 UNIT/ML ~~LOC~~ SOLN: 0.0000 [IU] | Freq: Every day | SUBCUTANEOUS | Status: DC

## 2019-08-31 MED ORDER — ASPIRIN 81 MG PO CHEW
CHEWABLE_TABLET | ORAL | Status: AC
  Filled 2019-08-31: qty 3

## 2019-08-31 MED ORDER — TICAGRELOR 90 MG PO TABS
90.0000 mg | ORAL_TABLET | Freq: Two times a day (BID) | ORAL | Status: DC
  Administered 2019-09-01: 90 mg via ORAL
  Filled 2019-08-31 (×2): qty 1

## 2019-08-31 MED ORDER — ASPIRIN EC 81 MG PO TBEC
DELAYED_RELEASE_TABLET | ORAL | Status: AC
  Filled 2019-08-31: qty 1

## 2019-08-31 MED ORDER — METOPROLOL TARTRATE 25 MG PO TABS
25.0000 mg | ORAL_TABLET | Freq: Two times a day (BID) | ORAL | Status: DC
  Administered 2019-08-31 – 2019-09-01 (×3): 25 mg via ORAL
  Filled 2019-08-31 (×3): qty 1

## 2019-08-31 MED ORDER — TICAGRELOR 90 MG PO TABS
ORAL_TABLET | ORAL | Status: AC
  Filled 2019-08-31: qty 2

## 2019-08-31 SURGICAL SUPPLY — 15 items
BALLN TREK RX 2.5X15 (BALLOONS) ×2
BALLOON TREK RX 2.5X15 (BALLOONS) ×1 IMPLANT
CATH INFINITI 5FR JL4 (CATHETERS) ×2 IMPLANT
CATH INFINITI JR4 5F (CATHETERS) ×2 IMPLANT
CATH VISTA GUIDE 6FR JR4 (CATHETERS) ×2 IMPLANT
DEVICE CLOSURE MYNXGRIP 6/7F (Vascular Products) ×2 IMPLANT
DEVICE INFLAT 30 PLUS (MISCELLANEOUS) ×2 IMPLANT
KIT MANI 3VAL PERCEP (MISCELLANEOUS) ×2 IMPLANT
NEEDLE PERC 18GX7CM (NEEDLE) ×2 IMPLANT
PACK CARDIAC CATH (CUSTOM PROCEDURE TRAY) ×2 IMPLANT
SHEATH AVANTI 5FR X 11CM (SHEATH) ×2 IMPLANT
SHEATH AVANTI 6FR X 11CM (SHEATH) ×2 IMPLANT
STENT RESOLUTE ONYX 2.5X15 (Permanent Stent) ×2 IMPLANT
WIRE G HI TQ BMW 190 (WIRE) ×2 IMPLANT
WIRE GUIDERIGHT .035X150 (WIRE) ×2 IMPLANT

## 2019-08-31 NOTE — ED Provider Notes (Addendum)
[facility] Emergency Department Provider Note    First MD Initiated Contact with Patient 08/31/19 1026     (approximate)  I have reviewed the triage vital signs and the nursing notes.   HISTORY  Chief Complaint Chest Pain    HPI [name] is a 59 y.o. male with the below listed past medical history presents to the ER for evaluation of persistent exertional chest pain radiating to his jaw and left arm and left shoulder.  States he been having this pain with exertion for several weeks.  Had work-up in the ER yesterday with reassuring work-up and exam with plan for outpatient follow-up in cardiology.  Followed up in cardiology clinic today and it does seem that started developing escalation of his symptoms since being seen in the ER therefore plan was to perform urgent cath.    Past Medical History:  Diagnosis Date  . Coronary artery disease   . Diabetes mellitus without complication ([name])   . GERD (gastroesophageal reflux disease)   . Hyperlipidemia   . Hypertension   . MI, acute, non ST segment elevation ([name])   . NSTEMI (non-ST elevated myocardial infarction) ([name])   . Sleep apnea   . Tobacco abuse    Family History  Problem Relation Age of Onset  . Heart disease Father   . Heart attack Father   . Hypertension Father   . Diabetes Father    Past Surgical History:  Procedure Laterality Date  . APPENDECTOMY    . CARDIAC CATHETERIZATION    . COLONOSCOPY    . COLONOSCOPY WITH PROPOFOL N/A 09/16/2017   Procedure: COLONOSCOPY WITH PROPOFOL;  Surgeon: Toledo, Benay Pike, MD;  Location: [facility] ENDOSCOPY;  Service: Gastroenterology;  Laterality: N/A;  (+) DM - oral  . CORONARY STENT INTERVENTION N/A 08/31/2019   Procedure: CORONARY STENT INTERVENTION;  Surgeon: Yolonda Kida, MD;  Location: [place] CV LAB;  Service: Cardiovascular;  Laterality: N/A;  . EXPLORATORY LAPAROTOMY    . EYE SURGERY    . HERNIA REPAIR    . LEFT HEART CATH AND  CORONARY ANGIOGRAPHY N/A 08/31/2019   Procedure: LEFT HEART CATH AND CORONARY ANGIOGRAPHY possible PCI and stent;  Surgeon: Yolonda Kida, MD;  Location: [place] CV LAB;  Service: Cardiovascular;  Laterality: N/A;   Patient Active Problem List   Diagnosis Date Noted  . Unstable angina ([name]) 08/31/2019  . Hyperglycemia due to type 2 diabetes mellitus ([name]) 08/31/2019  . Coronary artery disease   . Hypertension   . Sleep apnea   . Nicotine dependence       Prior to Admission medications   Medication Sig Start Date End Date Taking? Authorizing Provider  aspirin EC 81 MG tablet Take 81 mg by mouth daily.   Yes [provider]  atorvastatin (LIPITOR) 80 MG tablet Take 80 mg by mouth daily.   Yes [provider]  glipiZIDE (GLUCOTROL) 10 MG tablet Take 2 tablets (20 mg total) by mouth 2 (two) times daily before a meal. 09/01/19 10/01/19 Yes Wyvonnia Dusky, MD  lisinopril-hydrochlorothiazide (PRINZIDE,ZESTORETIC) 20-12.5 MG tablet Take 1 tablet by mouth daily.   Yes [provider]  metFORMIN (GLUCOPHAGE) 1000 MG tablet Take 1,000 mg by mouth 2 (two) times daily with a meal.   Yes [provider]  metoprolol tartrate (LOPRESSOR) 25 MG tablet Take 25 mg by mouth 2 (two) times daily.   Yes [provider]  pioglitazone (ACTOS) 45 MG tablet Take 45  mg by mouth daily.   Yes [provider]  sitaGLIPtin (JANUVIA) 100 MG tablet Take 100 mg by mouth daily.   Yes [provider]  canagliflozin (INVOKANA) 100 MG TABS tablet Take by mouth daily before breakfast. Patient not taking: Reported on 08/31/2019    [provider]  ranitidine (ZANTAC) 150 MG tablet Take 150 mg by mouth 2 (two) times daily. Patient not taking: Reported on 08/31/2019    [provider]  ticagrelor (BRILINTA) 90 MG TABS tablet Take 1 tablet (90 mg total) by mouth 2 (two) times daily. 09/01/19 10/01/19  Wyvonnia Dusky, MD     Allergies Shellfish allergy and Penicillins    Social History Social History   Tobacco Use  . Smoking status: Current Every Day Smoker    Packs/day: 1.00    Types: Cigarettes  . Smokeless tobacco: Never Used  Vaping Use  . Vaping Use: Never used  Substance Use Topics  . Alcohol use: No  . Drug use: No    Review of Systems Patient denies headaches, rhinorrhea, blurry vision, numbness, shortness of breath, chest pain, edema, cough, abdominal pain, nausea, vomiting, diarrhea, dysuria, fevers, rashes or hallucinations unless otherwise stated above in HPI. ____________________________________________   PHYSICAL EXAM:  VITAL SIGNS: Vitals:   09/01/19 0801 09/01/19 1155  BP:  (!) 151/77  Pulse: 75 64  Resp:  19  Temp:  98.5 F (36.9 C)  SpO2:  99%    Constitutional: Alert and oriented.  Eyes: Conjunctivae are normal.  Head: Atraumatic. Nose: No congestion/rhinnorhea. Mouth/Throat: Mucous membranes are moist.   Neck: No stridor. Painless ROM.  Cardiovascular: Normal rate, regular rhythm. Grossly normal heart sounds.  Good peripheral circulation. Respiratory: Normal respiratory effort.  No retractions. Lungs CTAB. Gastrointestinal: Soft and nontender. No distention. No abdominal bruits. No CVA tenderness. Genitourinary:  Musculoskeletal: No lower extremity tenderness nor edema.  No joint effusions. Neurologic:  Normal speech and language. No gross focal neurologic deficits are appreciated. No facial droop Skin:  Skin is warm, dry and intact. No rash noted. Psychiatric: Mood and affect are normal. Speech and behavior are normal.  ____________________________________________   LABS (all labs ordered are listed, but only abnormal results are displayed)  No results found for this or any previous visit (from the past 24 hour(s)). ____________________________________________  EKG My review and personal interpretation at Time: 9:39   Indication: chest pain  Rate:  100  Rhythm: sinus Axis: normal Other: normal intervals, no stemi ____________________________________________  RADIOLOGY    ____________________________________________   PROCEDURES  Procedure(s) performed:  .Critical Care Performed by: Merlyn Lot, MD Authorized by: Merlyn Lot, MD   Critical care provider statement:    Critical care time (minutes):  15   Critical care time was exclusive of:  Separately billable procedures and treating other patients   Critical care was necessary to treat or prevent imminent or life-threatening deterioration of the following conditions:  Cardiac failure   Critical care was time spent personally by me on the following activities:  Development of treatment plan with patient or surrogate, discussions with consultants, evaluation of patient's response to treatment, examination of patient, obtaining history from patient or surrogate, ordering and performing treatments and interventions, ordering and review of laboratory studies, ordering and review of radiographic studies, pulse oximetry, re-evaluation of patient's condition and review of old charts      Critical Care performed: yes ____________________________________________   INITIAL IMPRESSION / ASSESSMENT AND PLAN / ED COURSE  Pertinent labs & imaging results  that were available during my care of the patient were reviewed by me and considered in my medical decision making (see chart for details).   DDX: ACS, pericarditis, esophagitis, boerhaaves, pe, dissection, pna, bronchitis, costochondritis   MALAHKI GASAWAY is a 59 y.o. who presents to the ED with symptoms of worsening chest pain concerning for unstable angina.  Troponins yesterday were reassuring.  EKG today does not show any evidence of STEMI.  Case discussed with Dr. Clayborn Bigness who does request initiating heparin.  Will consult with hospitalist for admission.     The patient was evaluated in Emergency Department today for  the symptoms described in the history of present illness. He/she was evaluated in the context of the global COVID-19 pandemic, which necessitated consideration that the patient might be at risk for infection with the SARS-CoV-2 virus that causes COVID-19. Institutional protocols and algorithms that pertain to the evaluation of patients at risk for COVID-19 are in a state of rapid change based on information released by regulatory bodies including the CDC and federal and state organizations. These policies and algorithms were followed during the patient's care in the ED.  As part of my medical decision making, I reviewed the following data within the [place] notes reviewed and incorporated, Labs reviewed, notes from prior ED visits and [place] Controlled Substance Database   ____________________________________________   FINAL CLINICAL IMPRESSION(S) / ED DIAGNOSES  Final diagnoses:  Unstable angina ([place])      NEW MEDICATIONS STARTED DURING THIS VISIT:  Discharge Medication List as of 09/01/2019 12:53 PM    START taking these medications   Details  ticagrelor (BRILINTA) 90 MG TABS tablet Take 1 tablet (90 mg total) by mouth 2 (two) times daily., Starting Wed 09/01/2019, Until Fri 10/01/2019, Normal         Note:  This document was prepared using Dragon voice recognition software and may include unintentional dictation errors.    Merlyn Lot, MD 08/31/19 1036    Merlyn Lot, MD 09/14/19 1505

## 2019-08-31 NOTE — ED Notes (Signed)
Echo at bedside

## 2019-08-31 NOTE — ED Triage Notes (Signed)
Dr Clayborn Bigness called and would like pt admitted as he plans to cath him today at lunch time. Pt having left sided chest pain going to back.  Unlabored, VSS at this time.  Charge RN aware of pt.

## 2019-08-31 NOTE — CV Procedure (Signed)
Brief cath note Patient has preserved left ventricular function around 50% He has moderate to severe extensive coronary disease Left main was clear LAD had moderate to severe calcification with multiple areas of significant stenosis greater than 50 to 75% mostly distal Circumflex also has multiple areas distally 50-75 RCA was large but had a 99% proximal lesion fairly calcified There was a mid to distal hazy area that was 50-75 but difficult to get to because of calcification and tortuosity  PCI and stent intervention PCI and stent to proximal RCA with DES Reducing lesion from 99 down to 0% TIMI-3 flow throughout the case Patient placed on Brilinta aspirin for at least 12 months Angiomax at reduced rate for 2 hours Anticipate discharge hopefully tomorrow We will consider referral at a later date for more difficult areas that were tortuous and calcified if the patient continues to have symptoms This all can be done as outpatient hopefully We will also consider patient patient on Repatha since he is unable to tolerate statins

## 2019-08-31 NOTE — ED Notes (Signed)
Per Dr. Francine Graven, only given 8 units insulin at this time

## 2019-08-31 NOTE — Consult Note (Signed)
ANTICOAGULATION CONSULT NOTE - Initial Consult  Pharmacy Consult for Heparin Infusion  Indication: chest pain/ACS  Allergies  Allergen Reactions  . Shellfish Allergy Anaphylaxis  . Penicillins Swelling    Patient Measurements: Height: 5\' 6"  (167.6 cm) Weight: 99.8 kg (220 lb) IBW/kg (Calculated) : 63.8 Heparin Dosing Weight: 85kg  Vital Signs: Temp: 98.2 F (36.8 C) (07/13 0947) Temp Source: Oral (07/13 0947) BP: 106/76 (07/13 0948) Pulse Rate: 98 (07/13 0947)  Labs: Recent Labs    08/30/19 1536 08/31/19 0949  HGB 14.3 14.3  HCT 40.1 40.7  PLT 225 219  CREATININE 1.61* 1.54*  TROPONINIHS 6 6    Estimated Creatinine Clearance: 56.4 mL/min (A) (by C-G formula based on SCr of 1.54 mg/dL (H)).   Medical History: Past Medical History:  Diagnosis Date  . Coronary artery disease   . Diabetes mellitus without complication ([place])   . GERD (gastroesophageal reflux disease)   . Hyperlipidemia   . Hypertension   . MI, acute, non ST segment elevation ([place])   . NSTEMI (non-ST elevated myocardial infarction) ([place])   . Sleep apnea   . Tobacco abuse     Assessment: Pharmacy consulted for heparin infusion dosing and monitoring for for 58yo male for Chest Pain/ACS/STEMI   Goal of Therapy:  Heparin level 0.3-0.7 units/ml Monitor platelets by anticoagulation protocol: Yes   Plan:  Baseline labs ordered  Give 4000 units bolus x 1 Start heparin infusion at 1050 units/hr Check anti-Xa level in 6 hours and daily while on heparin Continue to monitor H&H and platelets  Pernell Dupre, PharmD, BCPS Clinical Pharmacist 08/31/2019 10:46 AM

## 2019-08-31 NOTE — H&P (Signed)
History and Physical    TC KAPUSTA JJH:[id number] DOB: 1961-01-27 DOA: 08/31/2019  PCP: Cyndi Bender, PA-C   Patient coming from: Home  I have personally briefly reviewed patient's old medical records in [place]  Chief Complaint: Chest pain  HPI: [name] is a 59 y.o. male with medical history significant for diabetes mellitus, coronary artery disease status post PCI with stent angioplasty, nicotine dependence, GERD, morbid obesity who was sent to the emergency room by his cardiologist for evaluation of chest pain concerning for an acute coronary syndrome.  Patient states that he has had lower sternal exertional chest pain for about 4 weeks with radiation to his left arm and jaw.  He rates his pain about a 6 x 10 in intensity at its worst and it is associated with shortness of breath but he denies having any nausea, vomiting, diaphoresis or palpitations.  He was seen in the emergency room 24 hours ago and had a reassuring work-up and was advised to follow-up with cardiology as an outpatient.  Patient currently has chest pain at rest associated with shortness of breath which is why he was sent to the emergency room for further evaluation. Twelve-lead EKG reviewed by me shows sinus tachycardia Chest x-ray reviewed by me shows clear lungs Labs show sodium of 130, glucose of 451, creatinine of 1.5 Patient's vital signs are stable and he has been started on heparin drip   ED Course: Patient is a 59 year old Caucasian male with a history of coronary artery disease status post PCI with stent angioplasty who was sent to the emergency room by his cardiologist for evaluation of worsening chest pain associated with shortness of breath and concerns for an acute coronary syndrome.  Twelve-lead EKG does not show any acute findings and troponin is negative.  Patient will be referred to observation status for further evaluation  Review of Systems: As per HPI otherwise 10 point review of  systems negative.    Past Medical History:  Diagnosis Date  . Coronary artery disease   . Diabetes mellitus without complication ([place])   . GERD (gastroesophageal reflux disease)   . Hyperlipidemia   . Hypertension   . MI, acute, non ST segment elevation ([place])   . NSTEMI (non-ST elevated myocardial infarction) ([place])   . Sleep apnea   . Tobacco abuse     Past Surgical History:  Procedure Laterality Date  . APPENDECTOMY    . CARDIAC CATHETERIZATION    . COLONOSCOPY    . COLONOSCOPY WITH PROPOFOL N/A 09/16/2017   Procedure: COLONOSCOPY WITH PROPOFOL;  Surgeon: Toledo, Benay Pike, MD;  Location: [facility] ENDOSCOPY;  Service: Gastroenterology;  Laterality: N/A;  (+) DM - oral  . EXPLORATORY LAPAROTOMY    . EYE SURGERY    . HERNIA REPAIR       reports that he has been smoking cigarettes. He has been smoking about 1.00 pack per day. He has never used smokeless tobacco. He reports that he does not drink alcohol and does not use drugs.  Allergies  Allergen Reactions  . Shellfish Allergy Anaphylaxis  . Penicillins Swelling    Family History  Problem Relation Age of Onset  . Heart disease Father   . Heart attack Father   . Hypertension Father   . Diabetes Father      Prior to Admission medications   Medication Sig Start Date End Date Taking? Authorizing Provider  aspirin EC 81 MG tablet Take 81 mg by mouth daily.  [provider]  atorvastatin (LIPITOR) 80 MG tablet Take 80 mg by mouth daily.    [provider]  canagliflozin (INVOKANA) 100 MG TABS tablet Take by mouth daily before breakfast.    [provider]  glipiZIDE (GLUCOTROL) 10 MG tablet Take 10 mg by mouth daily before breakfast.    [provider]  lisinopril-hydrochlorothiazide (PRINZIDE,ZESTORETIC) 20-12.5 MG tablet Take 1 tablet by mouth daily.    [provider]  meloxicam (MOBIC) 15 MG tablet Take 1 tablet (15 mg total) by mouth daily. 01/10/17   Khatri, Hina, PA-C    metFORMIN (GLUCOPHAGE) 1000 MG tablet Take 1,000 mg by mouth 2 (two) times daily with a meal.    [provider]  metoprolol tartrate (LOPRESSOR) 25 MG tablet Take 25 mg by mouth 2 (two) times daily.    [provider]  pioglitazone (ACTOS) 45 MG tablet Take 45 mg by mouth daily.    [provider]  ranitidine (ZANTAC) 150 MG tablet Take 150 mg by mouth 2 (two) times daily.    [provider]  sitaGLIPtin (JANUVIA) 100 MG tablet Take 100 mg by mouth daily.    [provider]    Physical Exam: Vitals:   08/31/19 0947 08/31/19 0948  BP: 90/71 106/76  Pulse: 98   Resp: 18   Temp: 98.2 F (36.8 C)   TempSrc: Oral   SpO2: 98%   Weight:  99.8 kg  Height:  5\' 6"  (1.676 m)     Vitals:   08/31/19 0947 08/31/19 0948  BP: 90/71 106/76  Pulse: 98   Resp: 18   Temp: 98.2 F (36.8 C)   TempSrc: Oral   SpO2: 98%   Weight:  99.8 kg  Height:  5\' 6"  (1.676 m)    Constitutional: NAD, alert and oriented x 3. Obese Eyes: PERRL, lids and conjunctivae normal ENMT: Mucous membranes are moist.  Neck: normal, supple, no masses, no thyromegaly Respiratory: clear to auscultation bilaterally, no wheezing, no crackles. Normal respiratory effort. No accessory muscle use.  Cardiovascular: Regular rate and rhythm,no murmurs / rubs / gallops. No extremity edema. 2+ pedal pulses. No carotid bruits.  Abdomen: no tenderness, no masses palpated. No hepatosplenomegaly. Bowel sounds positive.  Central adiposity Musculoskeletal: no clubbing / cyanosis. No joint deformity upper and lower extremities.  Skin: no rashes, lesions, ulcers.  Neurologic: No gross focal neurologic deficit. Psychiatric: Normal mood and affect.   Labs on Admission: I have personally reviewed following labs and imaging studies  CBC: Recent Labs  Lab 08/30/19 1536 08/31/19 0949  WBC 9.9 9.3  HGB 14.3 14.3  HCT 40.1 40.7  MCV 89.7 90.0  PLT 225 626   Basic Metabolic  Panel: Recent Labs  Lab 08/30/19 1536 08/31/19 0949  NA 132* 130*  K 4.6 4.6  CL 97* 99  CO2 23 18*  GLUCOSE 372* 451*  BUN 21* 22*  CREATININE 1.61* 1.54*  CALCIUM 9.3 9.1   GFR: Estimated Creatinine Clearance: 56.4 mL/min (A) (by C-G formula based on SCr of 1.54 mg/dL (H)). Liver Function Tests: No results for input(s): AST, ALT, ALKPHOS, BILITOT, PROT, ALBUMIN in the last 168 hours. No results for input(s): LIPASE, AMYLASE in the last 168 hours. No results for input(s): AMMONIA in the last 168 hours. Coagulation Profile: No results for input(s): INR, PROTIME in the last 168 hours. Cardiac Enzymes: No results for input(s): CKTOTAL, CKMB, CKMBINDEX, TROPONINI in the last 168 hours. BNP (last 3 results) No results for input(s):  PROBNP in the last 8760 hours. HbA1C: No results for input(s): HGBA1C in the last 72 hours. CBG: No results for input(s): GLUCAP in the last 168 hours. Lipid Profile: No results for input(s): CHOL, HDL, LDLCALC, TRIG, CHOLHDL, LDLDIRECT in the last 72 hours. Thyroid Function Tests: No results for input(s): TSH, T4TOTAL, FREET4, T3FREE, THYROIDAB in the last 72 hours. Anemia Panel: No results for input(s): VITAMINB12, FOLATE, FERRITIN, TIBC, IRON, RETICCTPCT in the last 72 hours. Urine analysis:    Component Value Date/Time   COLORURINE STRAW (A) 07/07/2014 2031   APPEARANCEUR CLEAR (A) 07/07/2014 2031   LABSPEC 1.007 07/07/2014 2031   PHURINE 5.0 07/07/2014 2031   GLUCOSEU >500 (A) 07/07/2014 2031   HGBUR NEGATIVE 07/07/2014 2031   BILIRUBINUR NEGATIVE 07/07/2014 2031   KETONESUR NEGATIVE 07/07/2014 2031   PROTEINUR NEGATIVE 07/07/2014 2031   NITRITE NEGATIVE 07/07/2014 2031   LEUKOCYTESUR NEGATIVE 07/07/2014 2031    Radiological Exams on Admission: DG Chest 2 View  Result Date: 08/30/2019 CLINICAL DATA:  Chest pain, dyspnea EXAM: CHEST - 2 VIEW COMPARISON:  03/15/2010, 06/25/2008 FINDINGS: The heart size and mediastinal contours are  within normal limits. Both lungs are clear. The visualized skeletal structures are unremarkable. IMPRESSION: No active cardiopulmonary disease. Electronically Signed   By: Fidela Salisbury MD   On: 08/30/2019 16:01    EKG: Independently reviewed.  Sinus tachycardia  Assessment/Plan Principal Problem:   Unstable angina (HCC) Active Problems:   Hyperglycemia due to type 2 diabetes mellitus ([place])   Coronary artery disease   Hypertension   Sleep apnea     Unstable angina In a patient with a long history of coronary artery disease status post PCI with stent angioplasty who presents for evaluation of chest pain which initially occurred with exertion but now he has chest pain at rest with radiation to his left shoulder, jaw and back associated with shortness of breath. Will obtain serial cardiac enzymes Monitor EKG Continue aspirin, statins and beta-blocker Place patient on a heparin drip Consult cardiology   Diabetes mellitus with complications of stage III chronic kidney disease and hyperglycemia Patient with significant hyperglycemia blood sugar of 400.  Normal serum bicarbonate levels Patient is on multiple oral hypoglycemic agents which be placed on hold Place patient on IV fluids Glycemic control with sliding scale insulin   Hypertension Continue metoprolol  Hold lisinopril/HCTZ since patient is normotensive   Nicotine dependence Patient smokes 1 pack of cigarettes daily Smoking cessation has been discussed with patient in detail   Obesity (BMI 35) Complicates overall prognosis and care   DVT prophylaxis: Heparin Code Status: Full code Family Communication: Greater than 50% of time was spent discussing plan of care with patient at the bedside.  He verbalizes understanding and agrees to the plan. Disposition Plan: Back to previous home environment Consults called: Cardiology    Arbie Reisz MD [facility]     08/31/2019, 11:45 AM

## 2019-08-31 NOTE — ED Notes (Signed)
Report given to cath lab, RN

## 2019-08-31 NOTE — Progress Notes (Signed)
*  PRELIMINARY RESULTS* Echocardiogram 2D Echocardiogram has been performed.  [name] 08/31/2019, 12:20 PM

## 2019-09-01 ENCOUNTER — Encounter: Payer: Self-pay | Admitting: Internal Medicine

## 2019-09-01 DIAGNOSIS — E1159 Type 2 diabetes mellitus with other circulatory complications: Secondary | ICD-10-CM

## 2019-09-01 DIAGNOSIS — I25110 Atherosclerotic heart disease of native coronary artery with unstable angina pectoris: Secondary | ICD-10-CM | POA: Diagnosis not present

## 2019-09-01 DIAGNOSIS — I200 Unstable angina: Secondary | ICD-10-CM | POA: Diagnosis not present

## 2019-09-01 LAB — CBC
HCT: 35.2 % — ABNORMAL LOW (ref 39.0–52.0)
Hemoglobin: 12.6 g/dL — ABNORMAL LOW (ref 13.0–17.0)
MCH: 32.4 pg (ref 26.0–34.0)
MCHC: 35.8 g/dL (ref 30.0–36.0)
MCV: 90.5 fL (ref 80.0–100.0)
Platelets: 202 10*3/uL (ref 150–400)
RBC: 3.89 MIL/uL — ABNORMAL LOW (ref 4.22–5.81)
RDW: 12.6 % (ref 11.5–15.5)
WBC: 9.1 10*3/uL (ref 4.0–10.5)
nRBC: 0 % (ref 0.0–0.2)

## 2019-09-01 LAB — BASIC METABOLIC PANEL
Anion gap: 7 (ref 5–15)
BUN: 23 mg/dL — ABNORMAL HIGH (ref 6–20)
CO2: 23 mmol/L (ref 22–32)
Calcium: 8.3 mg/dL — ABNORMAL LOW (ref 8.9–10.3)
Chloride: 106 mmol/L (ref 98–111)
Creatinine, Ser: 1.41 mg/dL — ABNORMAL HIGH (ref 0.61–1.24)
GFR calc Af Amer: >60 mL/min (ref >60)
GFR calc non Af Amer: 54 mL/min — ABNORMAL LOW (ref >60)
Glucose, Bld: 250 mg/dL — ABNORMAL HIGH (ref 70–99)
Potassium: 4.4 mmol/L (ref 3.5–5.1)
Sodium: 136 mmol/L (ref 135–145)

## 2019-09-01 LAB — LIPID PANEL
Cholesterol: 227 mg/dL — ABNORMAL HIGH (ref 0–200)
HDL: 34 mg/dL — ABNORMAL LOW (ref >40)
LDL Cholesterol: UNDETERMINED mg/dL (ref 0–99)
Total CHOL/HDL Ratio: 6.7 RATIO
Triglycerides: 755 mg/dL — ABNORMAL HIGH (ref <150)
VLDL: UNDETERMINED mg/dL (ref 0–40)

## 2019-09-01 LAB — GLUCOSE, CAPILLARY
Glucose-Capillary: 242 mg/dL — ABNORMAL HIGH (ref 70–99)
Glucose-Capillary: 224 mg/dL — ABNORMAL HIGH (ref 70–99)

## 2019-09-01 LAB — LDL CHOLESTEROL, DIRECT: Direct LDL: 71.5 mg/dL (ref 0–99)

## 2019-09-01 MED ORDER — GLIPIZIDE 10 MG PO TABS: 20.0000 mg | ORAL_TABLET | Freq: Two times a day (BID) | ORAL | 0 refills | Status: DC

## 2019-09-01 MED ORDER — TICAGRELOR 90 MG PO TABS: 90.0000 mg | ORAL_TABLET | Freq: Two times a day (BID) | ORAL | 0 refills | Status: DC

## 2019-09-01 NOTE — Discharge Summary (Signed)
Physician Discharge Summary  LARRY ALCOCK RFF:[id number] DOB: Jul 02, 1960 DOA: 08/31/2019  PCP: Cyndi Bender, PA-C  Admit date: 08/31/2019 Discharge date: 09/01/2019  Admitted From: home  Disposition: home   Recommendations for Outpatient Follow-up:  1. Follow up with PCP in 1-2 weeks 2. F/u cardio in 1 week   Home Health: no Equipment/Devices:  Discharge Condition: stable  CODE STATUS: full  Diet recommendation: Heart Healthy / Carb Modified  Brief/Interim Summary: HPI was taken from Dr. Francine Graven: [name] is a 59 y.o. male with medical history significant for diabetes mellitus, coronary artery disease status post PCI with stent angioplasty, nicotine dependence, GERD, morbid obesity who was sent to the emergency room by his cardiologist for evaluation of chest pain concerning for an acute coronary syndrome.  Patient states that he has had lower sternal exertional chest pain for about 4 weeks with radiation to his left arm and jaw.  He rates his pain about a 6 x 10 in intensity at its worst and it is associated with shortness of breath but he denies having any nausea, vomiting, diaphoresis or palpitations.  He was seen in the emergency room 24 hours ago and had a reassuring work-up and was advised to follow-up with cardiology as an outpatient.  Patient currently has chest pain at rest associated with shortness of breath which is why he was sent to the emergency room for further evaluation. Twelve-lead EKG reviewed by me shows sinus tachycardia Chest x-ray reviewed by me shows clear lungs Labs show sodium of 130, glucose of 451, creatinine of 1.5 Patient's vital signs are stable and he has been started on heparin drip   ED Course: Patient is a 59 year old Caucasian male with a history of coronary artery disease status post PCI with stent angioplasty who was sent to the emergency room by his cardiologist for evaluation of worsening chest pain associated with shortness of breath and  concerns for an acute coronary syndrome.  Twelve-lead EKG does not show any acute findings and troponin is negative.  Patient will be referred to observation status for further evaluation  Hospital Course from Dr. Jimmye Norman 09/01/19: Pt presented w/ unstable angina w/ hx of CAD & DM2. Pt had a cardiac cath and a stent placed in proximal RCA. Pt was started on brilinta & aspirin x 12 months as per cardio. Pt is unable to tolerate statins but cardio may consider putting pt on repatha as an outpatient. Pt will f/u w/ cardio, Dr. Clayborn Bigness, in 1 week. For more information, please see previous progress notes  Discharge Diagnoses:  Principal Problem:   Unstable angina ([place]) Active Problems:   Hyperglycemia due to type 2 diabetes mellitus ([place])   Coronary artery disease   Hypertension   Sleep apnea   Nicotine dependence  Unstable angina: w/ long history of coronary artery disease status post PCI with stent placement. S/p cardiac cath 08/31/19 w/ stent placed in proximal RCA. Continue on aspirin, brilinta for 12 months as per cardio. Unable to tolerate statin but cardio considering repatha   DM2: poorly controlled. 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 DM coordinator following. Continue on SSI w/ accuchecks   CKDIIIa: stable. Will continue to monitor   Hypertension: continue metoprolol. Hold lisinopril/HCTZsince patient is normotensive  Nicotine dependence: smokes 1 pack of cigarettes daily. Received smoking cessation counseling already   Obesity: BMI 33.3. Would benefit from  weight loss   Discharge Instructions  Discharge Instructions    AMB Referral to Cardiac Rehabilitation - Phase II   Complete by: As directed    Diagnosis: Coronary Stents   After initial evaluation and assessments completed: Virtual Based Care may be provided alone or in conjunction with Phase 2 Cardiac Rehab based on  patient barriers.: Yes   Diet - low sodium heart healthy   Complete by: As directed    Diet Carb Modified   Complete by: As directed    Discharge instructions   Complete by: As directed    F/u PCP in 1-2 weeks. F/u cardio, Dr. Clayborn Bigness, in 1 week   Increase activity slowly   Complete by: As directed      Allergies as of 09/01/2019      Reactions   Shellfish Allergy Anaphylaxis   Penicillins Swelling      Medication List    STOP taking these medications   meloxicam 15 MG tablet Commonly known as: Mobic     TAKE these medications   aspirin EC 81 MG tablet Take 81 mg by mouth daily.   atorvastatin 80 MG tablet Commonly known as: LIPITOR Take 80 mg by mouth daily.   glipiZIDE 10 MG tablet Commonly known as: GLUCOTROL Take 2 tablets (20 mg total) by mouth 2 (two) times daily before a meal. What changed: how much to take   Invokana 100 MG Tabs tablet Generic drug: canagliflozin Take by mouth daily before breakfast.   lisinopril-hydrochlorothiazide 20-12.5 MG tablet Commonly known as: ZESTORETIC Take 1 tablet by mouth daily.   metFORMIN 1000 MG tablet Commonly known as: GLUCOPHAGE Take 1,000 mg by mouth 2 (two) times daily with a meal.   metoprolol tartrate 25 MG tablet Commonly known as: LOPRESSOR Take 25 mg by mouth 2 (two) times daily.   pioglitazone 45 MG tablet Commonly known as: ACTOS Take 45 mg by mouth daily.   ranitidine 150 MG tablet Commonly known as: ZANTAC Take 150 mg by mouth 2 (two) times daily.   sitaGLIPtin 100 MG tablet Commonly known as: JANUVIA Take 100 mg by mouth daily.   ticagrelor 90 MG Tabs tablet Commonly known as: BRILINTA Take 1 tablet (90 mg total) by mouth 2 (two) times daily.       Allergies  Allergen Reactions  . Shellfish Allergy Anaphylaxis  . Penicillins Swelling    Consultations:  Cardio, Dr. Clayborn Bigness    Procedures/Studies: DG Chest 2 View  Result Date: 08/30/2019 CLINICAL DATA:  Chest pain, dyspnea  EXAM: CHEST - 2 VIEW COMPARISON:  03/15/2010, 06/25/2008 FINDINGS: The heart size and mediastinal contours are within normal limits. Both lungs are clear. The visualized skeletal structures are unremarkable. IMPRESSION: No active cardiopulmonary disease. Electronically Signed   By: Fidela Salisbury MD   On: 08/30/2019 16:01   CARDIAC CATHETERIZATION  Result Date: 08/31/2019  Prox RCA-1 lesion is 99% stenosed.  Prox RCA-2 lesion is 75% stenosed.  Prox RCA to Mid RCA lesion is 50% stenosed.  Dist RCA lesion is 70% stenosed.  Prox Cx lesion is 50% stenosed.  Dist Cx lesion is 50% stenosed.  Mid Cx lesion is 50% stenosed.  Mid LM lesion is 25% stenosed.  Dist LAD-1 lesion is 75% stenosed.  Dist LAD-2 lesion is 75% stenosed.  Dist LAD-3 lesion is 70% stenosed.  Prox LAD to Mid LAD lesion is 50% stenosed with 50% stenosed side branch in 1st Diag.  Dist LAD-4 lesion is 50% stenosed.  A drug-eluting stent was  successfully placed using a STENT RESOLUTE ONYX 2.5X15.  Post intervention, there is a 0% residual stenosis.  Plan Diagnostic cardiac cath Preserved left ventricular function 50% Left main mild irregularities LAD large serial 50% diffuse 50%, patient had serial 75% lesions mid to distal.  Diffuse 50% throughout.  Moderate to severe calcification proximal to mid possible mid stent widely patent. Circumflex is large mixed dominant serial 50% lesions with moderate to severe calcification in the proximal to mid circumflex there is a mid circumflex stent that is widely patent RCA was large mixed dominant 99% proximal lesion there is also a proximal to mid lesion that is about 75% hazy heavy calcification at a band mid RCA stent with patent but diffuse 50% lesion there is also a more distal 50 lesion at the bifurcation of the PL and PDA After reviewing the films we decided to proceed with intervention of the proximal RCA Successful PCI and stent proximal RCA with DES 2.5 x 15 mm resolute Onyx dilated to 21  atm lesion was reduced from 99 down to 0% More distal lesions were not attempted because of severe calcification and been making it very difficult to access A minx was deployed  ECHOCARDIOGRAM COMPLETE  Result Date: 08/31/2019    ECHOCARDIOGRAM REPORT   Patient Name:   [name] Date of Exam: 08/31/2019 Medical Rec #:  [id number]     Height:       66.0 in Accession #:    [id number]    Weight:       220.0 lb Date of Birth:  10-May-1960     BSA:          2.082 m Patient Age:    58 years      BP:           144/97 mmHg Patient Gender: M             HR:           80 bpm. Exam Location:  [facility] Procedure: 2D Echo, Color Doppler, Cardiac Doppler and Intracardiac            Opacification Agent Indications:     I25.10 CAD Native Vessel  History:         Patient has no prior history of Echocardiogram examinations.                  ESRD; Risk Factors:Current Smoker, Sleep Apnea, Hypertension,                  Dyslipidemia and Diabetes.  Sonographer:     Charmayne Sheer RDCS (AE) Referring Phys:  [id number] Collier Bullock Diagnosing Phys: Yolonda Kida MD  Sonographer Comments: Technically difficult study due to poor echo windows. Image acquisition challenging due to patient body habitus and Image acquisition challenging due to respiratory motion. IMPRESSIONS  1. Left ventricular ejection fraction, by estimation, is 50 to 55%. The left ventricle has low normal function. The left ventricle has no regional wall motion abnormalities. Left ventricular diastolic parameters are consistent with Grade I diastolic dysfunction (impaired relaxation).  2. Right ventricular systolic function is normal. The right ventricular size is normal.  3. The mitral valve is normal in structure. Trivial mitral valve regurgitation.  4. The aortic valve is grossly normal. Aortic valve regurgitation is not visualized. FINDINGS  Left Ventricle: Left ventricular ejection fraction, by estimation, is 50 to 55%. The left ventricle has low normal function. The  left ventricle has no regional wall motion abnormalities.  Definity contrast agent was given IV to delineate the left ventricular endocardial borders. The left ventricular internal cavity size was normal in size. There is borderline left ventricular hypertrophy. Left ventricular diastolic parameters are consistent with Grade I diastolic dysfunction (impaired relaxation). Right Ventricle: The right ventricular size is normal. No increase in right ventricular wall thickness. Right ventricular systolic function is normal. Left Atrium: Left atrial size was normal in size. Right Atrium: Right atrial size was normal in size. Pericardium: There is no evidence of pericardial effusion. Mitral Valve: The mitral valve is normal in structure. Trivial mitral valve regurgitation. MV peak gradient, 3.4 mmHg. The mean mitral valve gradient is 2.0 mmHg. Tricuspid Valve: The tricuspid valve is normal in structure. Tricuspid valve regurgitation is trivial. Aortic Valve: The aortic valve is grossly normal. Aortic valve regurgitation is not visualized. Aortic valve mean gradient measures 2.0 mmHg. Aortic valve peak gradient measures 3.7 mmHg. Aortic valve area, by VTI measures 3.00 cm. Pulmonic Valve: The pulmonic valve was normal in structure. Pulmonic valve regurgitation is not visualized. Aorta: The aortic root is normal in size and structure. IAS/Shunts: No atrial level shunt detected by color flow Doppler.  LEFT VENTRICLE PLAX 2D LVIDd:         4.09 cm  Diastology LVIDs:         2.92 cm  LV e' lateral:   6.31 cm/s LV PW:         1.23 cm  LV E/e' lateral: 8.0 LV IVS:        0.97 cm  LV e' medial:    5.11 cm/s LVOT diam:     2.30 cm  LV E/e' medial:  9.9 LV SV:         51 LV SV Index:   24 LVOT Area:     4.15 cm  LEFT ATRIUM         Index LA diam:    2.60 cm 1.25 cm/m  AORTIC VALVE                   PULMONIC VALVE AV Area (Vmax):    3.17 cm    PV Vmax:       0.92 m/s AV Area (Vmean):   3.05 cm    PV Vmean:      58.900 cm/s AV  Area (VTI):     3.00 cm    PV VTI:        0.126 m AV Vmax:           95.70 cm/s  PV Peak grad:  3.4 mmHg AV Vmean:          71.100 cm/s PV Mean grad:  2.0 mmHg AV VTI:            0.169 m AV Peak Grad:      3.7 mmHg AV Mean Grad:      2.0 mmHg LVOT Vmax:         73.10 cm/s LVOT Vmean:        52.200 cm/s LVOT VTI:          0.122 m LVOT/AV VTI ratio: 0.72  AORTA Ao Root diam: 4.00 cm MITRAL VALVE MV Area (PHT): 6.02 cm    SHUNTS MV Peak grad:  3.4 mmHg    Systemic VTI:  0.12 m MV Mean grad:  2.0 mmHg    Systemic Diam: 2.30 cm MV Vmax:       0.92 m/s MV Vmean:      64.1 cm/s  MV Decel Time: 126 msec MV E velocity: 50.60 cm/s MV A velocity: 76.70 cm/s MV E/A ratio:  0.66 Dwayne D Callwood MD Electronically signed by Yolonda Kida MD Signature Date/Time: 08/31/2019/1:30:04 PM    Final        Subjective: Pt denies any complaints    Discharge Exam: Vitals:   09/01/19 0801 09/01/19 1155  BP:  (!) 151/77  Pulse: 75 64  Resp:  19  Temp:  98.5 F (36.9 C)  SpO2:  99%   Vitals:   09/01/19 0430 09/01/19 0740 09/01/19 0801 09/01/19 1155  BP: 115/79 118/80  (!) 151/77  Pulse: (!) 57 (!) 56 75 64  Resp: _0 Temp: 98 F (36.7 C) 98 F (36.7 C)  98.5 F (36.9 C)  TempSrc: Oral Oral  Oral  SpO2: 97% 100%  99%  Weight: 93.8 kg     Height:        General: Pt is alert, awake, not in acute distress Cardiovascular: S1/S2 +, no rubs, no gallops Respiratory: CTA bilaterally, no wheezing, no rhonchi Abdominal: Soft, NT, ND, bowel sounds + Extremities:  no cyanosis    The results of significant diagnostics from this hospitalization (including imaging, microbiology, ancillary and laboratory) are listed below for reference.     Microbiology: Recent Results (from the past 240 hour(s))  SARS Coronavirus 2 by RT PCR (hospital order, performed in [facility] hospital lab) Nasopharyngeal Nasopharyngeal Swab     Status: None   Collection Time: 08/31/19 11:23 AM   Specimen: Nasopharyngeal Swab   Result Value Ref Range Status   SARS Coronavirus 2 NEGATIVE NEGATIVE Final    Comment: (NOTE) SARS-CoV-2 target nucleic acids are NOT DETECTED.  The SARS-CoV-2 RNA is generally detectable in upper and lower respiratory specimens during the acute phase of infection. The lowest concentration of SARS-CoV-2 viral copies this assay can detect is 250 copies / mL. A negative result does not preclude SARS-CoV-2 infection and should not be used as the sole basis for treatment or other patient management decisions.  A negative result may occur with improper specimen collection / handling, submission of specimen other than nasopharyngeal swab, presence of viral mutation(s) within the areas targeted by this assay, and inadequate number of viral copies (<250 copies / mL). A negative result must be combined with clinical observations, patient history, and epidemiological information.  Fact Sheet for Patients:   StrictlyIdeas.no  Fact Sheet for Healthcare Providers: BankingDealers.co.za  This test is not yet approved or  cleared by the [place] FDA and has been authorized for detection and/or diagnosis of SARS-CoV-2 by FDA under an Emergency Use Authorization (EUA).  This EUA will remain in effect (meaning this test can be used) for the duration of the COVID-19 declaration under Section 564(b)(1) of the Act, 21 U.S.C. section 360bbb-3(b)(1), unless the authorization is terminated or revoked sooner.  Performed at [facility], [address] [place], [place] [address]      Labs: BNP (last 3 results) No results for input(s): BNP in the last 8760 hours. Basic Metabolic Panel: Recent Labs  Lab 08/30/19 1536 08/31/19 0949 09/01/19 0450  NA 132* 130* 136  K 4.6 4.6 4.4  CL 97* 99 106  CO2 23 18* 23  GLUCOSE 372* 451* 250*  BUN 21* 22* 23*  CREATININE 1.61* 1.54* 1.41*  CALCIUM 9.3 9.1 8.3*   Liver Function Tests: No  results for input(s): AST, ALT, ALKPHOS, BILITOT, PROT, ALBUMIN in the last 168  hours. No results for input(s): LIPASE, AMYLASE in the last 168 hours. No results for input(s): AMMONIA in the last 168 hours. CBC: Recent Labs  Lab 08/30/19 1536 08/31/19 0949 09/01/19 0450  WBC 9.9 9.3 9.1  HGB 14.3 14.3 12.6*  HCT 40.1 40.7 35.2*  MCV 89.7 90.0 90.5  PLT 225 219 202   Cardiac Enzymes: No results for input(s): CKTOTAL, CKMB, CKMBINDEX, TROPONINI in the last 168 hours. BNP: Invalid input(s): POCBNP CBG: Recent Labs  Lab 08/31/19 1532 08/31/19 1811 08/31/19 1933 09/01/19 0741 09/01/19 1155  GLUCAP 230* 332* 381* 242* 224*   D-Dimer No results for input(s): DDIMER in the last 72 hours. Hgb A1c Recent Labs    08/31/19 1123  HGBA1C 12.1*   Lipid Profile Recent Labs    09/01/19 0450  CHOL 227*  HDL 34*  LDLCALC UNABLE TO CALCULATE IF TRIGLYCERIDE OVER 400 mg/dL  TRIG 755*  CHOLHDL 6.7  LDLDIRECT 71.5   Thyroid function studies No results for input(s): TSH, T4TOTAL, T3FREE, THYROIDAB in the last 72 hours.  Invalid input(s): FREET3 Anemia work up No results for input(s): VITAMINB12, FOLATE, FERRITIN, TIBC, IRON, RETICCTPCT in the last 72 hours. Urinalysis    Component Value Date/Time   COLORURINE STRAW (A) 07/07/2014 2031   APPEARANCEUR CLEAR (A) 07/07/2014 2031   LABSPEC 1.007 07/07/2014 2031   PHURINE 5.0 07/07/2014 2031   GLUCOSEU >500 (A) 07/07/2014 2031   HGBUR NEGATIVE 07/07/2014 2031   BILIRUBINUR NEGATIVE 07/07/2014 2031   KETONESUR NEGATIVE 07/07/2014 2031   PROTEINUR NEGATIVE 07/07/2014 2031   NITRITE NEGATIVE 07/07/2014 2031   LEUKOCYTESUR NEGATIVE 07/07/2014 2031   Sepsis Labs Invalid input(s): PROCALCITONIN,  WBC,  LACTICIDVEN Microbiology Recent Results (from the past 240 hour(s))  SARS Coronavirus 2 by RT PCR (hospital order, performed in [place] hospital lab) Nasopharyngeal Nasopharyngeal Swab     Status: None   Collection Time:  08/31/19 11:23 AM   Specimen: Nasopharyngeal Swab  Result Value Ref Range Status   SARS Coronavirus 2 NEGATIVE NEGATIVE Final    Comment: (NOTE) SARS-CoV-2 target nucleic acids are NOT DETECTED.  The SARS-CoV-2 RNA is generally detectable in upper and lower respiratory specimens during the acute phase of infection. The lowest concentration of SARS-CoV-2 viral copies this assay can detect is 250 copies / mL. A negative result does not preclude SARS-CoV-2 infection and should not be used as the sole basis for treatment or other patient management decisions.  A negative result may occur with improper specimen collection / handling, submission of specimen other than nasopharyngeal swab, presence of viral mutation(s) within the areas targeted by this assay, and inadequate number of viral copies (<250 copies / mL). A negative result must be combined with clinical observations, patient history, and epidemiological information.  Fact Sheet for Patients:   StrictlyIdeas.no  Fact Sheet for Healthcare Providers: BankingDealers.co.za  This test is not yet approved or  cleared by the [place] FDA and has been authorized for detection and/or diagnosis of SARS-CoV-2 by FDA under an Emergency Use Authorization (EUA).  This EUA will remain in effect (meaning this test can be used) for the duration of the COVID-19 declaration under Section 564(b)(1) of the Act, 21 U.S.C. section 360bbb-3(b)(1), unless the authorization is terminated or revoked sooner.  Performed at [facility], [address] [place], [place] [address]      Time coordinating discharge: Over 30 minutes  SIGNED:   Wyvonnia Dusky, MD  [facility] 09/01/2019, 3:31 PM Pager  If 7PM-7AM, please contact night-coverage [email or web address]

## 2019-09-01 NOTE — Progress Notes (Signed)
Nutrition Brief Note  RD received consult for Heart Healthy/Carbohydrate controlled diet education.   Pt already discharged at time of RD visit. Per RN, pt did not wish to wait for diet education. RD will mail Heart Healthy/Carbohydrate controlled diet handouts to pt's address on file.   Koleen Distance MS, RD, LDN Please refer to [facility] for RD and/or RD on-call/weekend/after hours pager

## 2019-09-01 NOTE — Progress Notes (Signed)
RNCM met with patient and family at bedside. Patient reports to feeling well today and is hopeful he is going home. RNCM provided patient with Brilinta card, no other needs identified at this time.

## 2019-09-01 NOTE — Progress Notes (Signed)
[facility] Cardiology    SUBJECTIVE: Patient states to be doing much better today chest pain and angina has dramatically improved denies any shortness of breath patient still smokes has not been very compliant with his diabetes blood pressure cholesterol or sleep apnea therapy.  Patient has extensive coronary disease and may need further evaluation and treatment depending on his symptoms.   Vitals:   08/31/19 1917 09/01/19 0430 09/01/19 0740 09/01/19 0801  BP: 120/86 115/79 118/80   Pulse: 73 (!) 57 (!) 56 75  Resp: 19 20 19    Temp: 98 F (36.7 C) 98 F (36.7 C) 98 F (36.7 C)   TempSrc: Oral Oral Oral   SpO2: 100% 97% 100%   Weight:  93.8 kg    Height:         Intake/Output Summary (Last 24 hours) at 09/01/2019 1040 Last data filed at 09/01/2019 0700 Gross per 24 hour  Intake 1100 ml  Output 800 ml  Net 300 ml      PHYSICAL EXAM  General: Well developed, well nourished, in no acute distress HEENT:  Normocephalic and atramatic Neck:  No JVD.  Lungs: Clear bilaterally to auscultation and percussion. Heart: HRRR . Normal S1 and S2 without gallops or murmurs.  Abdomen: Bowel sounds are positive, abdomen soft and non-tender  Msk:  Back normal, normal gait. Normal strength and tone for age. Extremities: No clubbing, cyanosis or edema.   Neuro: Alert and oriented X 3. Psych:  Good affect, responds appropriately   LABS: Basic Metabolic Panel: Recent Labs    08/31/19 0949 09/01/19 0450  NA 130* 136  K 4.6 4.4  CL 99 106  CO2 18* 23  GLUCOSE 451* 250*  BUN 22* 23*  CREATININE 1.54* 1.41*  CALCIUM 9.1 8.3*   Liver Function Tests: No results for input(s): AST, ALT, ALKPHOS, BILITOT, PROT, ALBUMIN in the last 72 hours. No results for input(s): LIPASE, AMYLASE in the last 72 hours. CBC: Recent Labs    08/31/19 0949 09/01/19 0450  WBC 9.3 9.1  HGB 14.3 12.6*  HCT 40.7 35.2*  MCV 90.0 90.5  PLT 219 202   Cardiac Enzymes: No results for input(s): CKTOTAL, CKMB,  CKMBINDEX, TROPONINI in the last 72 hours. BNP: Invalid input(s): POCBNP D-Dimer: No results for input(s): DDIMER in the last 72 hours. Hemoglobin A1C: Recent Labs    08/31/19 1123  HGBA1C 12.1*   Fasting Lipid Panel: Recent Labs    09/01/19 0450  CHOL 227*  HDL 34*  LDLCALC UNABLE TO CALCULATE IF TRIGLYCERIDE OVER 400 mg/dL  TRIG 755*  CHOLHDL 6.7  LDLDIRECT 71.5   Thyroid Function Tests: No results for input(s): TSH, T4TOTAL, T3FREE, THYROIDAB in the last 72 hours.  Invalid input(s): FREET3 Anemia Panel: No results for input(s): VITAMINB12, FOLATE, FERRITIN, TIBC, IRON, RETICCTPCT in the last 72 hours.  DG Chest 2 View  Result Date: 08/30/2019 CLINICAL DATA:  Chest pain, dyspnea EXAM: CHEST - 2 VIEW COMPARISON:  03/15/2010, 06/25/2008 FINDINGS: The heart size and mediastinal contours are within normal limits. Both lungs are clear. The visualized skeletal structures are unremarkable. IMPRESSION: No active cardiopulmonary disease. Electronically Signed   By: Fidela Salisbury MD   On: 08/30/2019 16:01   CARDIAC CATHETERIZATION  Result Date: 08/31/2019  Prox RCA-1 lesion is 99% stenosed.  Prox RCA-2 lesion is 75% stenosed.  Prox RCA to Mid RCA lesion is 50% stenosed.  Dist RCA lesion is 70% stenosed.  Prox Cx lesion is 50% stenosed.  Dist Cx lesion is  50% stenosed.  Mid Cx lesion is 50% stenosed.  Mid LM lesion is 25% stenosed.  Dist LAD-1 lesion is 75% stenosed.  Dist LAD-2 lesion is 75% stenosed.  Dist LAD-3 lesion is 70% stenosed.  Prox LAD to Mid LAD lesion is 50% stenosed with 50% stenosed side branch in 1st Diag.  Dist LAD-4 lesion is 50% stenosed.  A drug-eluting stent was successfully placed using a STENT RESOLUTE ONYX 2.5X15.  Post intervention, there is a 0% residual stenosis.  Plan Diagnostic cardiac cath Preserved left ventricular function 50% Left main mild irregularities LAD large serial 50% diffuse 50%, patient had serial 75% lesions mid to distal.   Diffuse 50% throughout.  Moderate to severe calcification proximal to mid possible mid stent widely patent. Circumflex is large mixed dominant serial 50% lesions with moderate to severe calcification in the proximal to mid circumflex there is a mid circumflex stent that is widely patent RCA was large mixed dominant 99% proximal lesion there is also a proximal to mid lesion that is about 75% hazy heavy calcification at a band mid RCA stent with patent but diffuse 50% lesion there is also a more distal 50 lesion at the bifurcation of the PL and PDA After reviewing the films we decided to proceed with intervention of the proximal RCA Successful PCI and stent proximal RCA with DES 2.5 x 15 mm resolute Onyx dilated to 21 atm lesion was reduced from 99 down to 0% More distal lesions were not attempted because of severe calcification and been making it very difficult to access A minx was deployed  ECHOCARDIOGRAM COMPLETE  Result Date: 08/31/2019    ECHOCARDIOGRAM REPORT   Patient Name:   [name] Date of Exam: 08/31/2019 Medical Rec #:  [id number]     Height:       66.0 in Accession #:    [id number]    Weight:       220.0 lb Date of Birth:  01/19/1961     BSA:          2.082 m Patient Age:    58 years      BP:           144/97 mmHg Patient Gender: M             HR:           80 bpm. Exam Location:  [facility] Procedure: 2D Echo, Color Doppler, Cardiac Doppler and Intracardiac            Opacification Agent Indications:     I25.10 CAD Native Vessel  History:         Patient has no prior history of Echocardiogram examinations.                  ESRD; Risk Factors:Current Smoker, Sleep Apnea, Hypertension,                  Dyslipidemia and Diabetes.  Sonographer:     Charmayne Sheer RDCS (AE) Referring Phys:  [id number] Collier Bullock Diagnosing Phys: Yolonda Kida MD  Sonographer Comments: Technically difficult study due to poor echo windows. Image acquisition challenging due to patient body habitus and Image acquisition  challenging due to respiratory motion. IMPRESSIONS  1. Left ventricular ejection fraction, by estimation, is 50 to 55%. The left ventricle has low normal function. The left ventricle has no regional wall motion abnormalities. Left ventricular diastolic parameters are consistent with Grade I diastolic dysfunction (impaired relaxation).  2. Right ventricular  systolic function is normal. The right ventricular size is normal.  3. The mitral valve is normal in structure. Trivial mitral valve regurgitation.  4. The aortic valve is grossly normal. Aortic valve regurgitation is not visualized. FINDINGS  Left Ventricle: Left ventricular ejection fraction, by estimation, is 50 to 55%. The left ventricle has low normal function. The left ventricle has no regional wall motion abnormalities. Definity contrast agent was given IV to delineate the left ventricular endocardial borders. The left ventricular internal cavity size was normal in size. There is borderline left ventricular hypertrophy. Left ventricular diastolic parameters are consistent with Grade I diastolic dysfunction (impaired relaxation). Right Ventricle: The right ventricular size is normal. No increase in right ventricular wall thickness. Right ventricular systolic function is normal. Left Atrium: Left atrial size was normal in size. Right Atrium: Right atrial size was normal in size. Pericardium: There is no evidence of pericardial effusion. Mitral Valve: The mitral valve is normal in structure. Trivial mitral valve regurgitation. MV peak gradient, 3.4 mmHg. The mean mitral valve gradient is 2.0 mmHg. Tricuspid Valve: The tricuspid valve is normal in structure. Tricuspid valve regurgitation is trivial. Aortic Valve: The aortic valve is grossly normal. Aortic valve regurgitation is not visualized. Aortic valve mean gradient measures 2.0 mmHg. Aortic valve peak gradient measures 3.7 mmHg. Aortic valve area, by VTI measures 3.00 cm. Pulmonic Valve: The pulmonic  valve was normal in structure. Pulmonic valve regurgitation is not visualized. Aorta: The aortic root is normal in size and structure. IAS/Shunts: No atrial level shunt detected by color flow Doppler.  LEFT VENTRICLE PLAX 2D LVIDd:         4.09 cm  Diastology LVIDs:         2.92 cm  LV e' lateral:   6.31 cm/s LV PW:         1.23 cm  LV E/e' lateral: 8.0 LV IVS:        0.97 cm  LV e' medial:    5.11 cm/s LVOT diam:     2.30 cm  LV E/e' medial:  9.9 LV SV:         51 LV SV Index:   24 LVOT Area:     4.15 cm  LEFT ATRIUM         Index LA diam:    2.60 cm 1.25 cm/m  AORTIC VALVE                   PULMONIC VALVE AV Area (Vmax):    3.17 cm    PV Vmax:       0.92 m/s AV Area (Vmean):   3.05 cm    PV Vmean:      58.900 cm/s AV Area (VTI):     3.00 cm    PV VTI:        0.126 m AV Vmax:           95.70 cm/s  PV Peak grad:  3.4 mmHg AV Vmean:          71.100 cm/s PV Mean grad:  2.0 mmHg AV VTI:            0.169 m AV Peak Grad:      3.7 mmHg AV Mean Grad:      2.0 mmHg LVOT Vmax:         73.10 cm/s LVOT Vmean:        52.200 cm/s LVOT VTI:          0.122 m LVOT/AV  VTI ratio: 0.72  AORTA Ao Root diam: 4.00 cm MITRAL VALVE MV Area (PHT): 6.02 cm    SHUNTS MV Peak grad:  3.4 mmHg    Systemic VTI:  0.12 m MV Mean grad:  2.0 mmHg    Systemic Diam: 2.30 cm MV Vmax:       0.92 m/s MV Vmean:      64.1 cm/s MV Decel Time: 126 msec MV E velocity: 50.60 cm/s MV A velocity: 76.70 cm/s MV E/A ratio:  0.66 Ettie Krontz D Ambers Iyengar MD Electronically signed by Yolonda Kida MD Signature Date/Time: 08/31/2019/1:30:04 PM    Final      Echo preserved left ventricular function EF around 50%  TELEMETRY: Normal sinus rhythm nonspecific ST-T wave changes  ASSESSMENT AND PLAN:  Principal Problem:   Unstable angina (HCC) Active Problems:   Hyperglycemia due to type 2 diabetes mellitus ([place])   Coronary artery disease   Hypertension   Sleep apnea   Nicotine dependence Status post PCI and stent to RCA with DES Obesity Obstructive  sleep apnea Smoking Chronic renal insufficiency  Plan Recommend increase activity ambulate in the hall Continue blood pressure management and control Maintain aggressive diabetes management Maintain aspirin 81 mg a day Brilinta 90 mg twice a day May need to switch to Plavix from Brilinta if expense becomes an issue Will consider placing the patient on Repatha since he is unable to tolerate statin therapy Continue beta-blocker ACE inhibitor Will probably start a triglyceride medication gemfibrozil or fenofibrate Recommend sleep study CPAP as necessary for obstructive sleep apnea therapy Advised patient to refrain from tobacco abuse Have the patient follow-up with nephrology for renal insufficiency Have the patient follow-up with cardiology 1 to 2 weeks   Yolonda Kida, MD,  09/01/2019 10:40 AM

## 2019-09-01 NOTE — Progress Notes (Signed)
**Note Jeff-Identified via Obfuscation** [name] to be D/C'd Home per MD order.  Discussed prescriptions and follow up appointments with the patient. Prescriptions given to patient, medication list explained in detail. Pt verbalized understanding.  Allergies as of 09/01/2019      Reactions   Shellfish Allergy Anaphylaxis   Penicillins Swelling      Medication List    STOP taking these medications   meloxicam 15 MG tablet Commonly known as: Mobic     TAKE these medications   aspirin EC 81 MG tablet Take 81 mg by mouth daily.   atorvastatin 80 MG tablet Commonly known as: LIPITOR Take 80 mg by mouth daily.   glipiZIDE 10 MG tablet Commonly known as: GLUCOTROL Take 2 tablets (20 mg total) by mouth 2 (two) times daily before a meal. What changed: how much to take   Invokana 100 MG Tabs tablet Generic drug: canagliflozin Take by mouth daily before breakfast.   lisinopril-hydrochlorothiazide 20-12.5 MG tablet Commonly known as: ZESTORETIC Take 1 tablet by mouth daily.   metFORMIN 1000 MG tablet Commonly known as: GLUCOPHAGE Take 1,000 mg by mouth 2 (two) times daily with a meal.   metoprolol tartrate 25 MG tablet Commonly known as: LOPRESSOR Take 25 mg by mouth 2 (two) times daily.   pioglitazone 45 MG tablet Commonly known as: ACTOS Take 45 mg by mouth daily.   ranitidine 150 MG tablet Commonly known as: ZANTAC Take 150 mg by mouth 2 (two) times daily.   sitaGLIPtin 100 MG tablet Commonly known as: JANUVIA Take 100 mg by mouth daily.   ticagrelor 90 MG Tabs tablet Commonly known as: BRILINTA Take 1 tablet (90 mg total) by mouth 2 (two) times daily.       Vitals:   09/01/19 0801 09/01/19 1155  BP:  (!) 151/77  Pulse: 75 64  Resp:  19  Temp:  98.5 F (36.9 C)  SpO2:  99%    Tele box removed and returned. Skin clean, dry and intact without evidence of skin break down, no evidence of skin tears noted. IV catheter discontinued intact. Site without signs and symptoms of complications.  Dressing and pressure applied. Pt denies pain at this time. No complaints noted.  An After Visit Summary was printed and given to the patient. Patient escorted via [place], and D/C home via private auto.  [name]

## 2019-09-01 NOTE — Progress Notes (Addendum)
Inpatient Diabetes Program Recommendations  AACE/ADA: New Consensus Statement on Inpatient Glycemic Control (2015)  Target Ranges:  Prepandial:   less than 140 mg/dL      Peak postprandial:   less than 180 mg/dL (1-2 hours)      Critically ill patients:  140 - 180 mg/dL   Results for [name], [name] (MRN [id number]) as of 09/01/2019 06:13  Ref. Range 08/31/2019 13:03 08/31/2019 14:03 08/31/2019 15:32 08/31/2019 18:11 08/31/2019 19:33  Glucose-Capillary Latest Ref Range: 70 - 99 mg/dL 357 (H)  8 units NOVOLOG  326 (H) 230 (H) 332 (H) 381 (H)  20 units NOVOLOG    Results for [name], [name] (MRN [id number]) as of 09/01/2019 06:13  Ref. Range 08/31/2019 11:23  Hemoglobin A1C Latest Ref Range: 4.8 - 5.6 % 12.1 (H)  (300 mg/dl)   Results for [name], [name] (MRN [id number]) as of 09/01/2019 06:13  Ref. Range 09/01/2019 04:50  Glucose Latest Ref Range: 70 - 99 mg/dL 250 (H)   Admit CP  Home DM Meds: Glipizide 10 mg Daily + Invokana 100 mg Daily + Actos 45 mg Daily + Metformin 1000 mg BID + Januvia 100 mg Daily  Current Orders: Novolog Moderate Correction Scale/ SSI (0-15 units) TID AC + HS    PCP: Cyndi Bender, PA     MD- Lab glucose 250 on AM BMET today.  Please consider:  Start Levemir 15 units Daily (0.15 units/kg)   Addendum 12pm--Met with pt to discuss current A1c of 12.1%.  Explained what an A1c is and what it measures.  Reminded patient that his goal A1c is 7% or less per ADA standards to prevent both acute and long-term complications.  Explained to patient the extreme importance of good glucose control at home especially with his cardiac issues.  Encouraged patient to check his CBGs at least bid at home (fasting and another check within the day) and to record all CBGs in a logbook for his PCP to review.  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  Gave up regular soda about 2 weeks ago but  still drinking an occasional regular soda on the road and drinks sweet tea at night.  Told me he is on the "C-Food" diet--he sees food and eats it.  We had an extensive conversation about the importance of good CBG control in relation to heart disease and other vascular complications.  Pt is willing to try to make better nutritional changes at home in an effort to better manage CBGs.  We could also have him increase his Glipizide to 20 mg BID (currently only taking Glipizide 20 mg Daily).  I have alerted the Attending MD to this info via [place].    I have ordered an RD consult for follow up DM diet education.  Discussed DM diet information with patient.  Encouraged patient to avoid beverages with sugar (regular soda, sweet tea, lemonade, fruit juice) and to consume mostly water.  Discussed what foods contain carbohydrates and how carbohydrates affect the body's blood sugar levels.  Encouraged patient to be careful with his portion sizes (especially grains, starchy vegetables, and fruits).       --Will follow patient during hospitalization--  Wyn Quaker RN, MSN, CDE Diabetes Coordinator Inpatient Glycemic Control Team Team Pager: [phone] (8a-5p)

## 2019-09-09 ENCOUNTER — Ambulatory Visit: Admit: 2019-09-09 | Payer: Self-pay | Admitting: Internal Medicine

## 2019-09-09 SURGERY — LEFT HEART CATH AND CORONARY ANGIOGRAPHY
Anesthesia: Moderate Sedation | Laterality: Left

## 2019-09-27 ENCOUNTER — Telehealth: Payer: Self-pay

## 2019-09-27 DIAGNOSIS — I25110 Atherosclerotic heart disease of native coronary artery with unstable angina pectoris: Secondary | ICD-10-CM

## 2019-09-27 NOTE — Telephone Encounter (Addendum)
Patient sts that he will head back from out of town in order for the procedure to be performed on 09/29/19 with Dr. Fletcher Anon. Patient will have labwork (bmet, cbc) [facility] on 09[facility]19 with a COVID test at the [facility] the same day.  Adv the patient to arrive at the drive up site at 8am to ensure his COVID test will be resulted for the next day.  Patient given verbal pre-procedure instructions. Patient made aware of the procedure, date, time, location. Adv the patient of the current visitors policy of 1 support person for outpatient procedures.  Patient verbalized understanding. Patient has no questions or concerns at this time.    [address], [place] [address] Dept: [phone] Loc: [place]  09/27/2019  You are scheduled for a Cardiac Catheterization on Wednesday, August 11 with Dr. Kathlyn Sacramento.  1. Please arrive at the [facility] (Main Entrance A) at [facility]: [address] [place], [place] [address] at 10:30 AM (This time is two hours before your procedure to ensure your preparation). Free valet parking service is available.   Special note: Every effort is made to have your procedure done on time. Please understand that emergencies sometimes delay scheduled procedures.  2. Diet: Do not eat solid foods after midnight.  The patient may have clear liquids until 5am upon the day of the procedure.  3. Labs: You will need to have blood drawn on Tuesday, August 10 at [facility], Go to 1st desk on your right to register.  Address: [address] [place], [place] [address]  Open: 8am - 5pm  Phone: [phone]. You do not need to be fasting.  4. Medication instructions in preparation for your procedure:   Contrast Allergy: No   Current Outpatient Medications (Endocrine & Metabolic):  .  canagliflozin (INVOKANA) 100  MG TABS tablet, Take by mouth daily before breakfast. (Patient not taking: Reported on 08/31/2019) .  glipiZIDE (GLUCOTROL) 10 MG tablet, Take 2 tablets (20 mg total) by mouth 2 (two) times daily before a meal. .  metFORMIN (GLUCOPHAGE) 1000 MG tablet, Take 1,000 mg by mouth 2 (two) times daily with a meal. .  pioglitazone (ACTOS) 45 MG tablet, Take 45 mg by mouth daily. .  sitaGLIPtin (JANUVIA) 100 MG tablet, Take 100 mg by mouth daily.  Current Outpatient Medications (Cardiovascular):  .  atorvastatin (LIPITOR) 80 MG tablet, Take 80 mg by mouth daily. Marland Kitchen  lisinopril-hydrochlorothiazide (PRINZIDE,ZESTORETIC) 20-12.5 MG tablet, Take 1 tablet by mouth daily. .  metoprolol tartrate (LOPRESSOR) 25 MG tablet, Take 25 mg by mouth 2 (two) times daily.   Current Outpatient Medications (Analgesics):  .  aspirin EC 81 MG tablet, Take 81 mg by mouth daily.  Current Outpatient Medications (Hematological):  .  ticagrelor (BRILINTA) 90 MG TABS tablet, Take 1 tablet (90 mg total) by mouth 2 (two) times daily.  Current Outpatient Medications (Other):  .  ranitidine (ZANTAC) 150 MG tablet, Take 150 mg by mouth 2 (two) times daily. (Patient not taking: Reported on 08/31/2019)    Do not take Diabetes Med Glucophage (Metformin) on the day of the procedure and HOLD 48 HOURS AFTER THE PROCEDURE.  On the morning of your procedure, take your Aspirin  and any morning medicines NOT listed above.  You may use sips of water.  5. Plan for one night stay--bring personal belongings. 6. Bring a  current list of your medications and current insurance cards. 7. You MUST have a responsible person to drive you home. 8. Someone MUST be with you the first 24 hours after you arrive home or your discharge will be delayed. 9. Please wear clothes that are easy to get on and off and wear slip-on shoes.  Thank you for allowing [place] to care for you!   -- [facility]

## 2019-09-27 NOTE — Telephone Encounter (Signed)
Called the patient. He has not received a call from Dr. Clayborn Bigness as yet. Adv the patient of Dr. Tyrell Antonio message below.  Patient is not available to schedule the cardiac cath w/pci for Wed 09/29/19. He is out of town and will return on Sun 10/03/19.  Adv the patient I will give Dr. Clayborn Bigness the opportunity to talk with him and I will fwd the update to Dr. Fletcher Anon.  He is to call the office in the interim if any questions or if he is ready to proceed. Adv the patient that I will call back with other potential dates.

## 2019-09-27 NOTE — Telephone Encounter (Signed)
Patient is calling regarding his appointments tomorrow. Please call to discuss.

## 2019-09-27 NOTE — Telephone Encounter (Signed)
Message received from Dr. Fletcher Anon. Called the patient as requested. The patient sts that he is unaware of the need for a second opinion, he has not requested one  and has no idea what I am talking about. Patient sts that he is a patient of Dr. Etta Quill and is scheduled for a f/u with him on 10/06/19. I asked the patient if Dr. Clayborn Bigness discussed with him the need for a PCI with Dr. Fletcher Anon and the patient states no.  Adv the patient that I will fwd the message to Dr. Fletcher Anon to update and clarify.

## 2019-09-27 NOTE — Telephone Encounter (Signed)
-----   Message from Wellington Hampshire, MD sent at 09/23/2019  5:34 PM EDT ----- This is a patient of Dr. Clayborn Bigness who asked me for a second opinion.  The patient needs to have RCA PCI at [facility] by me.  I can do on Wednesday, August 11.  Can you please get in touch with the patient and arrange.  Thanks.

## 2019-09-27 NOTE — Telephone Encounter (Signed)
I discussed this with Dr. Clayborn Bigness again today.  He is going to reach out to the patient and explain to him.

## 2019-09-28 ENCOUNTER — Telehealth: Payer: Self-pay | Admitting: *Deleted

## 2019-09-28 ENCOUNTER — Other Ambulatory Visit: Payer: Self-pay

## 2019-09-28 ENCOUNTER — Other Ambulatory Visit
Admission: RE | Admit: 2019-09-28 | Discharge: 2019-09-28 | Disposition: A | Discharge status: Home / Self Care | Payer: 59 | Source: Home / Self Care | Attending: Cardiovascular Disease | Admitting: Cardiovascular Disease

## 2019-09-28 ENCOUNTER — Other Ambulatory Visit
Admission: RE | Admit: 2019-09-28 | Discharge: 2019-09-28 | Disposition: A | Discharge status: Home / Self Care | Payer: 59 | Source: Ambulatory Visit | Attending: Cardiovascular Disease | Admitting: Cardiovascular Disease

## 2019-09-28 DIAGNOSIS — Z01812 Encounter for preprocedural laboratory examination: Secondary | ICD-10-CM | POA: Diagnosis present

## 2019-09-28 DIAGNOSIS — I25110 Atherosclerotic heart disease of native coronary artery with unstable angina pectoris: Secondary | ICD-10-CM

## 2019-09-28 DIAGNOSIS — Z20822 Contact with and (suspected) exposure to covid-19: Secondary | ICD-10-CM | POA: Insufficient documentation

## 2019-09-28 LAB — CBC WITH DIFFERENTIAL/PLATELET
Abs Immature Granulocytes: 0.15 10*3/uL — ABNORMAL HIGH (ref 0.00–0.07)
Basophils Absolute: 0.1 10*3/uL (ref 0.0–0.1)
Basophils Relative: 1 %
Eosinophils Absolute: 0.3 10*3/uL (ref 0.0–0.5)
Eosinophils Relative: 3 %
HCT: 37.5 % — ABNORMAL LOW (ref 39.0–52.0)
Hemoglobin: 12.9 g/dL — ABNORMAL LOW (ref 13.0–17.0)
Immature Granulocytes: 2 %
Lymphocytes Relative: 33 %
Lymphs Abs: 3.3 10*3/uL (ref 0.7–4.0)
MCH: 31.3 pg (ref 26.0–34.0)
MCHC: 34.4 g/dL (ref 30.0–36.0)
MCV: 91 fL (ref 80.0–100.0)
Monocytes Absolute: 0.8 10*3/uL (ref 0.1–1.0)
Monocytes Relative: 8 %
Neutro Abs: 5.6 10*3/uL (ref 1.7–7.7)
Neutrophils Relative %: 53 %
Platelets: 228 10*3/uL (ref 150–400)
RBC: 4.12 MIL/uL — ABNORMAL LOW (ref 4.22–5.81)
RDW: 13.1 % (ref 11.5–15.5)
WBC: 10.1 10*3/uL (ref 4.0–10.5)
nRBC: 0 % (ref 0.0–0.2)

## 2019-09-28 LAB — BASIC METABOLIC PANEL
Anion gap: 13 (ref 5–15)
BUN: 25 mg/dL — ABNORMAL HIGH (ref 6–20)
CO2: 22 mmol/L (ref 22–32)
Calcium: 9.7 mg/dL (ref 8.9–10.3)
Chloride: 97 mmol/L — ABNORMAL LOW (ref 98–111)
Creatinine, Ser: 1.62 mg/dL — ABNORMAL HIGH (ref 0.61–1.24)
GFR calc Af Amer: 53 mL/min — ABNORMAL LOW (ref >60)
GFR calc non Af Amer: 45 mL/min — ABNORMAL LOW (ref >60)
Glucose, Bld: 275 mg/dL — ABNORMAL HIGH (ref 70–99)
Potassium: 4.1 mmol/L (ref 3.5–5.1)
Sodium: 132 mmol/L — ABNORMAL LOW (ref 135–145)

## 2019-09-28 LAB — SARS CORONAVIRUS 2 (TAT 6-24 HRS): SARS Coronavirus 2: NEGATIVE

## 2019-09-28 NOTE — Telephone Encounter (Addendum)
Pt contacted pre-PCI  scheduled at [facility] for: Wednesday September 29, 2019 12:30 PM Verified arrival time and place: [facility]) at: 7:30 AM-pre-procedure hydration Discussed with patient   No solid food after midnight prior to cath, clear liquids until 5 AM day of procedure.  Pt states he does not have contrast allergy.  Hold: Diabetes medications AM of procedure including: Glipizide-AM of procedure  Actos-AM of procedure Metformin-day of procedure and 48 hours post procedure HCTZ-AM of procedure  Except hold medications AM meds can be  taken pre-cath with sips of water including: ASA 81 mg Plavix 75 mg - confirmed with patient he is currently taking this.  Confirmed patient has responsible adult to drive home post procedure and observe 24 hours after arriving home: yes  You are allowed ONE visitor in the waiting room during your procedure. Both you and your visitor must wear a mask once you enter the hospital.      COVID-19 Pre-Screening Questions:  . In the past 14 days have you had a new cough associated with shortness of breath, unexplained body aches, fever (100.4 or greater) or sudden loss of taste or sense of smell? no . In the past 14 days have you been around anyone with known Covid 19? no . Have you been vaccinated for COVID-19? Yes, see immunization history  Reviewed procedure/mask/visitor instructions, COVID-19 questions with patient.

## 2019-09-28 NOTE — Progress Notes (Signed)
Called the patient to come in early for IVF, Cr today 1.6.  Left voicemail for patient to arrive @ 730 for IVF

## 2019-09-29 ENCOUNTER — Encounter (HOSPITAL_COMMUNITY): Payer: Self-pay | Admitting: Cardiovascular Disease

## 2019-09-29 ENCOUNTER — Encounter (HOSPITAL_COMMUNITY)
Admission: RE | Disposition: A | Discharge status: Home / Self Care | Payer: Self-pay | Source: Home / Self Care | Attending: Cardiovascular Disease

## 2019-09-29 ENCOUNTER — Ambulatory Visit (HOSPITAL_COMMUNITY)
Admission: RE | Admit: 2019-09-29 | Discharge: 2019-09-30 | Disposition: A | Discharge status: Home / Self Care | Payer: 59 | Attending: Cardiovascular Disease | Admitting: Cardiovascular Disease

## 2019-09-29 DIAGNOSIS — Z7984 Long term (current) use of oral hypoglycemic drugs: Secondary | ICD-10-CM | POA: Insufficient documentation

## 2019-09-29 DIAGNOSIS — I10 Essential (primary) hypertension: Secondary | ICD-10-CM | POA: Diagnosis not present

## 2019-09-29 DIAGNOSIS — Z880 Allergy status to penicillin: Secondary | ICD-10-CM | POA: Insufficient documentation

## 2019-09-29 DIAGNOSIS — Z7982 Long term (current) use of aspirin: Secondary | ICD-10-CM | POA: Diagnosis not present

## 2019-09-29 DIAGNOSIS — Z6835 Body mass index (BMI) 35.0-35.9, adult: Secondary | ICD-10-CM | POA: Diagnosis not present

## 2019-09-29 DIAGNOSIS — R001 Bradycardia, unspecified: Secondary | ICD-10-CM | POA: Diagnosis not present

## 2019-09-29 DIAGNOSIS — I25110 Atherosclerotic heart disease of native coronary artery with unstable angina pectoris: Secondary | ICD-10-CM | POA: Insufficient documentation

## 2019-09-29 DIAGNOSIS — I209 Angina pectoris, unspecified: Secondary | ICD-10-CM | POA: Diagnosis present

## 2019-09-29 DIAGNOSIS — E785 Hyperlipidemia, unspecified: Secondary | ICD-10-CM | POA: Insufficient documentation

## 2019-09-29 DIAGNOSIS — I252 Old myocardial infarction: Secondary | ICD-10-CM | POA: Diagnosis not present

## 2019-09-29 DIAGNOSIS — G4733 Obstructive sleep apnea (adult) (pediatric): Secondary | ICD-10-CM | POA: Insufficient documentation

## 2019-09-29 DIAGNOSIS — Z79899 Other long term (current) drug therapy: Secondary | ICD-10-CM | POA: Insufficient documentation

## 2019-09-29 DIAGNOSIS — Z7902 Long term (current) use of antithrombotics/antiplatelets: Secondary | ICD-10-CM | POA: Diagnosis not present

## 2019-09-29 DIAGNOSIS — I25119 Atherosclerotic heart disease of native coronary artery with unspecified angina pectoris: Secondary | ICD-10-CM

## 2019-09-29 DIAGNOSIS — Z955 Presence of coronary angioplasty implant and graft: Secondary | ICD-10-CM

## 2019-09-29 DIAGNOSIS — K219 Gastro-esophageal reflux disease without esophagitis: Secondary | ICD-10-CM | POA: Insufficient documentation

## 2019-09-29 DIAGNOSIS — E1165 Type 2 diabetes mellitus with hyperglycemia: Secondary | ICD-10-CM | POA: Insufficient documentation

## 2019-09-29 DIAGNOSIS — E669 Obesity, unspecified: Secondary | ICD-10-CM | POA: Diagnosis not present

## 2019-09-29 DIAGNOSIS — F17200 Nicotine dependence, unspecified, uncomplicated: Secondary | ICD-10-CM | POA: Diagnosis not present

## 2019-09-29 HISTORY — PX: CORONARY STENT INTERVENTION: CATH118234

## 2019-09-29 LAB — GLUCOSE, CAPILLARY
Glucose-Capillary: 178 mg/dL — ABNORMAL HIGH (ref 70–99)
Glucose-Capillary: 180 mg/dL — ABNORMAL HIGH (ref 70–99)
Glucose-Capillary: 271 mg/dL — ABNORMAL HIGH (ref 70–99)

## 2019-09-29 LAB — CREATININE, SERUM
Creatinine, Ser: 1.26 mg/dL — ABNORMAL HIGH (ref 0.61–1.24)
GFR calc Af Amer: >60 mL/min (ref >60)
GFR calc non Af Amer: >60 mL/min (ref >60)

## 2019-09-29 LAB — CBC
HCT: 37.1 % — ABNORMAL LOW (ref 39.0–52.0)
Hemoglobin: 12.3 g/dL — ABNORMAL LOW (ref 13.0–17.0)
MCH: 31 pg (ref 26.0–34.0)
MCHC: 33.2 g/dL (ref 30.0–36.0)
MCV: 93.5 fL (ref 80.0–100.0)
Platelets: 214 10*3/uL (ref 150–400)
RBC: 3.97 MIL/uL — ABNORMAL LOW (ref 4.22–5.81)
RDW: 13 % (ref 11.5–15.5)
WBC: 10.4 10*3/uL (ref 4.0–10.5)
nRBC: 0 % (ref 0.0–0.2)

## 2019-09-29 LAB — POCT ACTIVATED CLOTTING TIME
Activated Clotting Time: 274 seconds
Activated Clotting Time: 279 seconds
Activated Clotting Time: 246 seconds

## 2019-09-29 SURGERY — CORONARY STENT INTERVENTION
Anesthesia: LOCAL

## 2019-09-29 MED ORDER — VERAPAMIL HCL 2.5 MG/ML IV SOLN
INTRAVENOUS | Status: AC
  Filled 2019-09-29: qty 2

## 2019-09-29 MED ORDER — ENOXAPARIN SODIUM 40 MG/0.4ML ~~LOC~~ SOLN
40.0000 mg | SUBCUTANEOUS | Status: DC
  Filled 2019-09-29: qty 0.4

## 2019-09-29 MED ORDER — HEPARIN (PORCINE) IN NACL 1000-0.9 UT/500ML-% IV SOLN
INTRAVENOUS | Status: DC | PRN
  Administered 2019-09-29 (×2): 500 mL

## 2019-09-29 MED ORDER — ASPIRIN 81 MG PO CHEW: 81.0000 mg | CHEWABLE_TABLET | ORAL | Status: DC

## 2019-09-29 MED ORDER — CLOPIDOGREL BISULFATE 75 MG PO TABS: 75.0000 mg | ORAL_TABLET | ORAL | Status: DC

## 2019-09-29 MED ORDER — SODIUM CHLORIDE 0.9 % IV SOLN: 250.0000 mL | INTRAVENOUS | Status: DC | PRN

## 2019-09-29 MED ORDER — GLIPIZIDE 10 MG PO TABS
10.0000 mg | ORAL_TABLET | Freq: Two times a day (BID) | ORAL | Status: DC
  Administered 2019-09-29 – 2019-09-30 (×2): 10 mg via ORAL
  Filled 2019-09-29 (×2): qty 1

## 2019-09-29 MED ORDER — HYDROCHLOROTHIAZIDE 25 MG PO TABS
25.0000 mg | ORAL_TABLET | Freq: Every day | ORAL | Status: DC
  Administered 2019-09-30: 25 mg via ORAL
  Filled 2019-09-29: qty 1

## 2019-09-29 MED ORDER — SODIUM CHLORIDE 0.9% FLUSH: 3.0000 mL | INTRAVENOUS | Status: DC | PRN

## 2019-09-29 MED ORDER — NITROGLYCERIN 0.4 MG SL SUBL: 0.4000 mg | SUBLINGUAL_TABLET | SUBLINGUAL | Status: DC | PRN

## 2019-09-29 MED ORDER — PIOGLITAZONE HCL 45 MG PO TABS
45.0000 mg | ORAL_TABLET | Freq: Every day | ORAL | Status: DC
  Administered 2019-09-30: 45 mg via ORAL
  Filled 2019-09-29: qty 1

## 2019-09-29 MED ORDER — VITAMIN B-12 1000 MCG PO TABS
1000.0000 ug | ORAL_TABLET | Freq: Every day | ORAL | Status: DC
  Administered 2019-09-30: 1000 ug via ORAL
  Filled 2019-09-29: qty 1

## 2019-09-29 MED ORDER — HEPARIN SODIUM (PORCINE) 1000 UNIT/ML IJ SOLN
INTRAMUSCULAR | Status: DC | PRN
  Administered 2019-09-29: 10000 [IU] via INTRAVENOUS
  Administered 2019-09-29: 2000 [IU] via INTRAVENOUS

## 2019-09-29 MED ORDER — HEPARIN SODIUM (PORCINE) 1000 UNIT/ML IJ SOLN
INTRAMUSCULAR | Status: AC
  Filled 2019-09-29: qty 1

## 2019-09-29 MED ORDER — ISOSORBIDE MONONITRATE ER 60 MG PO TB24
60.0000 mg | ORAL_TABLET | Freq: Every day | ORAL | Status: DC
  Administered 2019-09-30: 60 mg via ORAL
  Filled 2019-09-29: qty 1

## 2019-09-29 MED ORDER — VERAPAMIL HCL 2.5 MG/ML IV SOLN
INTRAVENOUS | Status: DC | PRN
  Administered 2019-09-29: 10 mL via INTRA_ARTERIAL

## 2019-09-29 MED ORDER — NITROGLYCERIN 1 MG/10 ML FOR IR/CATH LAB
INTRA_ARTERIAL | Status: AC
  Filled 2019-09-29: qty 10

## 2019-09-29 MED ORDER — MIDAZOLAM HCL 2 MG/2ML IJ SOLN
INTRAMUSCULAR | Status: DC | PRN
  Administered 2019-09-29 (×3): 1 mg via INTRAVENOUS

## 2019-09-29 MED ORDER — ATORVASTATIN CALCIUM 80 MG PO TABS
80.0000 mg | ORAL_TABLET | Freq: Every day | ORAL | Status: DC
  Administered 2019-09-30: 80 mg via ORAL
  Filled 2019-09-29: qty 1

## 2019-09-29 MED ORDER — NITROGLYCERIN 1 MG/10 ML FOR IR/CATH LAB
INTRA_ARTERIAL | Status: DC | PRN
  Administered 2019-09-29: 200 ug via INTRACORONARY

## 2019-09-29 MED ORDER — SODIUM CHLORIDE 0.9 % WEIGHT BASED INFUSION
1.0000 mL/kg/h | INTRAVENOUS | Status: AC
  Administered 2019-09-29: 1 mL/kg/h via INTRAVENOUS

## 2019-09-29 MED ORDER — MIDAZOLAM HCL 2 MG/2ML IJ SOLN
INTRAMUSCULAR | Status: AC
  Filled 2019-09-29: qty 2

## 2019-09-29 MED ORDER — SODIUM CHLORIDE 0.9 % WEIGHT BASED INFUSION
1.0000 mL/kg/h | INTRAVENOUS | Status: DC
  Administered 2019-09-29 (×2): 1 mL/kg/h via INTRAVENOUS

## 2019-09-29 MED ORDER — MORPHINE SULFATE (PF) 2 MG/ML IV SOLN
2.0000 mg | INTRAVENOUS | Status: DC | PRN
  Administered 2019-09-29: 2 mg via INTRAVENOUS
  Filled 2019-09-29: qty 1

## 2019-09-29 MED ORDER — CINNAMON 500 MG PO CAPS: 1000.0000 mg | ORAL_CAPSULE | Freq: Every day | ORAL | Status: DC

## 2019-09-29 MED ORDER — ASPIRIN EC 81 MG PO TBEC
81.0000 mg | DELAYED_RELEASE_TABLET | Freq: Every day | ORAL | Status: DC
  Administered 2019-09-30: 81 mg via ORAL
  Filled 2019-09-29: qty 1

## 2019-09-29 MED ORDER — CLOPIDOGREL BISULFATE 75 MG PO TABS
75.0000 mg | ORAL_TABLET | Freq: Every day | ORAL | Status: DC
  Administered 2019-09-30: 75 mg via ORAL
  Filled 2019-09-29: qty 1

## 2019-09-29 MED ORDER — FENTANYL CITRATE (PF) 100 MCG/2ML IJ SOLN
INTRAMUSCULAR | Status: AC
  Filled 2019-09-29: qty 2

## 2019-09-29 MED ORDER — SODIUM CHLORIDE 0.9 % WEIGHT BASED INFUSION
3.0000 mL/kg/h | INTRAVENOUS | Status: DC
  Administered 2019-09-29: 3 mL/kg/h via INTRAVENOUS

## 2019-09-29 MED ORDER — SODIUM CHLORIDE 0.9% FLUSH: 3.0000 mL | Freq: Two times a day (BID) | INTRAVENOUS | Status: DC

## 2019-09-29 MED ORDER — ONDANSETRON HCL 4 MG/2ML IJ SOLN: 4.0000 mg | Freq: Four times a day (QID) | INTRAMUSCULAR | Status: DC | PRN

## 2019-09-29 MED ORDER — FENTANYL CITRATE (PF) 100 MCG/2ML IJ SOLN
INTRAMUSCULAR | Status: DC | PRN
  Administered 2019-09-29 (×2): 25 ug via INTRAVENOUS
  Administered 2019-09-29: 50 ug via INTRAVENOUS

## 2019-09-29 MED ORDER — IOHEXOL 350 MG/ML SOLN
INTRAVENOUS | Status: DC | PRN
  Administered 2019-09-29: 125 mL via INTRA_ARTERIAL

## 2019-09-29 MED ORDER — ACETAMINOPHEN 325 MG PO TABS: 650.0000 mg | ORAL_TABLET | ORAL | Status: DC | PRN

## 2019-09-29 MED ORDER — FAMOTIDINE 20 MG PO TABS
20.0000 mg | ORAL_TABLET | Freq: Every day | ORAL | Status: DC
  Administered 2019-09-30: 20 mg via ORAL
  Filled 2019-09-29: qty 1

## 2019-09-29 MED ORDER — HEPARIN (PORCINE) IN NACL 1000-0.9 UT/500ML-% IV SOLN
INTRAVENOUS | Status: AC
  Filled 2019-09-29: qty 1000

## 2019-09-29 MED ORDER — CARVEDILOL 3.125 MG PO TABS
3.1250 mg | ORAL_TABLET | Freq: Two times a day (BID) | ORAL | Status: DC
  Administered 2019-09-29 – 2019-09-30 (×2): 3.125 mg via ORAL
  Filled 2019-09-29 (×2): qty 1

## 2019-09-29 MED ORDER — LIDOCAINE HCL (PF) 1 % IJ SOLN
INTRAMUSCULAR | Status: DC | PRN
  Administered 2019-09-29: 2 mL via INTRADERMAL

## 2019-09-29 MED ORDER — HYDRALAZINE HCL 20 MG/ML IJ SOLN: 10.0000 mg | INTRAMUSCULAR | Status: AC | PRN

## 2019-09-29 MED ORDER — LIDOCAINE HCL (PF) 1 % IJ SOLN
INTRAMUSCULAR | Status: AC
  Filled 2019-09-29: qty 30

## 2019-09-29 SURGICAL SUPPLY — 28 items
BALLN EMERGE MR 1.5X15 (BALLOONS) ×2
BALLN SAPPHIRE 2.5X20 (BALLOONS) ×2
BALLN SAPPHIRE ~~LOC~~ 2.75X18 (BALLOONS) ×2 IMPLANT
BALLN SAPPHIRE ~~LOC~~ 3.25X18 (BALLOONS) ×2 IMPLANT
BALLN ~~LOC~~ EMERGE MR 2.5X20 (BALLOONS) ×2
BALLOON EMERGE MR 1.5X15 (BALLOONS) ×1 IMPLANT
BALLOON SAPPHIRE 2.5X20 (BALLOONS) ×1 IMPLANT
BALLOON ~~LOC~~ EMERGE MR 2.5X20 (BALLOONS) ×1 IMPLANT
CATH LAUNCHER 6FR AL.75 (CATHETERS) ×2 IMPLANT
CATH SHOCKWAVE 2.5X12 (CATHETERS) ×1 IMPLANT
CATH TELEPORT (CATHETERS) ×2 IMPLANT
CATH TELESCOPE 6F GEC (CATHETERS) ×2 IMPLANT
CATH VISTA GUIDE 6FR JR4 (CATHETERS) ×2 IMPLANT
CATHETER SHOCKWAVE 2.5X12 (CATHETERS) ×2
ELECT DEFIB PAD ADLT CADENCE (PAD) ×2 IMPLANT
GLIDESHEATH SLEND A-KIT 6F 22G (SHEATH) IMPLANT
GLIDESHEATH SLEND SS 6F .021 (SHEATH) ×2 IMPLANT
GUIDEWIRE INQWIRE 1.5J.035X260 (WIRE) ×1 IMPLANT
INQWIRE 1.5J .035X260CM (WIRE) ×2
KIT HEART LEFT (KITS) ×2 IMPLANT
PACK CARDIAC CATHETERIZATION (CUSTOM PROCEDURE TRAY) ×2 IMPLANT
SHEATH PROBE COVER 6X72 (BAG) ×2 IMPLANT
STENT RESOLUTE ONYX 3.0X26 (Permanent Stent) ×2 IMPLANT
TRANSDUCER W/STOPCOCK (MISCELLANEOUS) ×2 IMPLANT
TUBING CIL FLEX 10 FLL-RA (TUBING) ×2 IMPLANT
WIRE ASAHI GRAND SLAM 180CM (WIRE) ×2 IMPLANT
WIRE GUIDE ASAHI EXTENSION 165 (WIRE) ×2 IMPLANT
WIRE RUNTHROUGH .014X300CM (WIRE) ×2 IMPLANT

## 2019-09-29 NOTE — H&P (Signed)
Jobe Gibbon, MD - 09/13/2019 9:00 AM EDT Formatting of this note is different from the original. Established Patient Visit   Chief Complaint: Chief Complaint  Patient presents with  . Follow-up  from stent, has had some chest pain, arm and jaw pain on saturday  . Shortness of Breath  Date of Service: 09/13/2019 Date of Birth: 1960-10-02 PCP: Heath Gold, PA  History of Present Illness: Mr. Poole is a 58 y.o.male patient who recently presented with unstable anginal accelerating symptoms seen in emergency room referred to the office but was having active pain patient was then admitted subsequent underwent emergent cardiac cath sign of multivessel coronary disease but symptomatic vessels the proximal RCA which was treated with PCI and stent with DES August 31, 2019. Patient since had significant improvement in symptoms still has some angina with activity he has been controlled on Lipitor Plavix aspirin metoprolol lisinopril but still has some symptoms especially with moderate activity. He also complains of bilateral leg weakness soreness with activity and walking still has dyspnea on exertion chest tightness heaviness he had some yesterday while walking and had to discontinue his walking routine now here for follow-up evaluation with his son  Past Medical and Surgical History  Past Medical History Past Medical History:  Diagnosis Date  . Coronary artery disease  . Diabetes mellitus type 2, uncomplicated (CMS-HCC)  . Gastroesophageal reflux disease without esophagitis 07/16/2017  . Hyperlipidemia  . Hypertension  . NSTEMI (non-ST elevated myocardial infarction) (CMS-HCC)  . OSA on CPAP 07/16/2017  . Tobacco abuse 07/16/2017   Past Surgical History He has a past surgical history that includes [facility]; Colonoscopy (06/05/2009); Appendectomy (2012); Exploratory laparotomy (2012); Colonoscopy (09/16/2017); and Cardiac catheterization.   Medications and  Allergies  Current Medications  Current Outpatient Medications  Medication Sig Dispense Refill  . aspirin 81 MG EC tablet Take 81 mg by mouth once daily.  Marland Kitchen atorvastatin (LIPITOR) 80 MG tablet  . clopidogreL (PLAVIX) 75 mg tablet Take 1 tablet (75 mg total) by mouth once daily 90 tablet 3  . dulaglutide (TRULICITY SUBQ) Inject subcutaneously  . glipiZIDE (GLUCOTROL) 10 MG tablet Take 10 mg by mouth 2 (two) times daily.  . hydroCHLOROthiazide (HYDRODIURIL) 25 MG tablet  . lisinopril-hydrochlorothiazide (PRINZIDE,ZESTORETIC) 20-12.5 mg tablet Take 1 tablet by mouth once daily.  . meloxicam (MOBIC) 15 MG tablet Take 15 mg by mouth once daily.  . metFORMIN (GLUCOPHAGE) 1000 MG tablet Take 1,000 mg by mouth 2 (two) times daily with meals.  . metoprolol tartrate (LOPRESSOR) 25 MG tablet Take 25 mg by mouth 2 (two) times daily.  . nitroGLYcerin (NITROSTAT) 0.4 MG SL tablet PLACE 1 TABLET UNDER TONGUE EVERY 5 MINS, UP TO 3 DOSES AS NEEDED FOR CHEST PAIN  . pioglitazone (ACTOS) 45 MG tablet Take 45 mg by mouth once daily  . isosorbide mononitrate (IMDUR) 30 MG ER tablet Take 1 tablet (30 mg total) by mouth once daily 30 tablet 11   No current facility-administered medications for this visit.   Allergies: Penicillins and Shellfish containing products  Social and Family History  Social History reports that he has been smoking. He has never used smokeless tobacco. He reports that he does not drink alcohol and does not use drugs.  Family History Family History  Problem Relation Age of Onset  . Heart disease Father  . Myocardial Infarction (Heart attack) Father  . High blood pressure (Hypertension) Father  . Diabetes Father   Review of Systems  Review of Systems: The patient denies chest pain, shortness of breath, orthopnea, paroxysmal nocturnal dyspnea, pedal edema, palpitations, heart racing, presyncope, syncope. Review of 12 Systems is negative except as described above.  Physical  Examination   Vitals:BP 120/64  Pulse 61  Ht 167.6 cm (5\' 6" )  Wt 95.3 kg (210 lb)  SpO2 100%  BMI 33.89 kg/m  Ht:167.6 cm (5\' 6" ) Wt:95.3 kg (210 lb) JYN:WGNF surface area is 2.11 meters squared. Body mass index is 33.89 kg/m.  HEENT: Pupils equally reactive to light and accomodation  Neck: Supple without thyromegaly, carotid pulses 2+ Lungs: clear to auscultation bilaterally; no wheezes, rales, rhonchi Heart: Regular rate and rhythm. No gallops, murmurs or rub Abdomen: soft nontender, nondistended, with normal bowel sounds Extremities: no cyanosis, clubbing, or edema Peripheral Pulses: 2+ in all extremities, 2+ femoral pulses bilaterally  Assessment   59 y.o. male with  1. Chest pain at rest  2. SOB (shortness of breath)  3. Coronary artery disease, unspecified vessel or lesion type, unspecified whether angina present, unspecified whether native or transplanted heart  4. Essential hypertension  5. Unstable angina (CMS-HCC)  6. Gastroesophageal reflux disease without esophagitis  7. OSA on CPAP  8. Tobacco abuse  9. Type 2 diabetes mellitus without complication, unspecified whether long term insulin use (CMS-HCC)  10. Class 2 severe obesity due to excess calories with serious comorbidity and body mass index (BMI) of 35.0 to 35.9 in adult (CMS-HCC)  11. Hyperlipidemia, unspecified hyperlipidemia type  12. Status post angioplasty with stent   Plan  1 status post PCI and stent to proximal RCA after a bout of unstable angina patient currently on Plavix aspirin continue current therapy 2 multivessel coronary disease most significant area of the macula vessel was treated with DES and stent 08/31/19 3 obstructive sleep apnea by history recommend sleep study CPAP weight loss 4 GERD recommend continue OTC omeprazole as needed for reflux type symptoms 5 diabetes type 2 uncomplicated maintained on Metformin Actos glipizide dulaglutide 6 hyperlipidemia maintained on Lipitor for lipid  management at 80 high dose if the patient unable to tolerate will consider switching to Repatha 7 history of smoking long-term chronic advised patient refrain from tobacco abuse 8 hypertension reasonably controlled currently on lisinopril HCTZ and metoprolol 9 angina with multivessel coronary disease only 1 area treated would recommend functional study for reassessment identification of ischemic zones in anticipation of potential intervention targeted 10 claudication possibly with weakness and fatigue of legs would recommend referral to vascular for further assessment consider ABIs 11 screening for AAA recommend ultrasound abdominal cavity 12 recommend carotid screening multiple risk factors known coronary disease 13 with anginal symptoms we will start Imdur 30 mg a day to help control symptoms 14 dyspnea shortness of breath echocardiogram will be helpful for further assessment angina obstructive sleep apnea COPD deconditioning 15 obesity recommend significant weight loss exercise portion control 16 have the patient follow-up in 1 month  Return in about 1 month (around 10/14/2019).  DWAYNE Prince Rome, MD  This dictation was prepared with dragon dictation. Any transcription errors that result from this process are unintentional.   Electronically signed by Jobe Gibbon, MD at 09/14/2019 7:41 AM EDT    The patient has known history of extensive coronary artery disease with multiple stenting in the past.  He has been followed by Dr. Clayborn Bigness who discussed the case with me.  In brief, recent RCA PCI.  The vessel was tortuous and calcified with previous layers of stents.  There  was significant disease in the proximal and mid segment.  He was able to treat only the proximal segment and was not able to advance any balloons in the tortuous mid segment.  The patient reports minimal improvement in angina since then.  He is having substernal chest tightness and shortness of breath walking a  short distance.  This is in spite of being on metoprolol and long-acting nitroglycerin.  Due to this, we discussed proceeding with attempting RCA PCI again to see if we can treat the proximal to mid segment with plans to use intravascular lithotripsy if able to advance equipment.  I discussed this extensively with the patient and his daughter and answered their question.  I explained to them that with it will be a higher risk procedure than a standard PCI.  By physical exam, heart is regular with no murmurs.  Lungs are clear to auscultation with no significant leg edema.  Right radial pulses normal. I Viewed the patient's medications and he is on dual antiplatelet therapy.  Indication for PCI: Class III angina in spite of maximal antianginal therapy. No recent stress test as he has known CAD.

## 2019-09-30 ENCOUNTER — Encounter (HOSPITAL_COMMUNITY): Payer: Self-pay | Admitting: Cardiovascular Disease

## 2019-09-30 DIAGNOSIS — I25110 Atherosclerotic heart disease of native coronary artery with unstable angina pectoris: Secondary | ICD-10-CM | POA: Diagnosis not present

## 2019-09-30 DIAGNOSIS — E1165 Type 2 diabetes mellitus with hyperglycemia: Secondary | ICD-10-CM

## 2019-09-30 DIAGNOSIS — Z955 Presence of coronary angioplasty implant and graft: Secondary | ICD-10-CM

## 2019-09-30 DIAGNOSIS — I252 Old myocardial infarction: Secondary | ICD-10-CM | POA: Diagnosis not present

## 2019-09-30 DIAGNOSIS — I209 Angina pectoris, unspecified: Secondary | ICD-10-CM | POA: Diagnosis not present

## 2019-09-30 DIAGNOSIS — E785 Hyperlipidemia, unspecified: Secondary | ICD-10-CM | POA: Diagnosis not present

## 2019-09-30 LAB — BASIC METABOLIC PANEL
Anion gap: 11 (ref 5–15)
BUN: 19 mg/dL (ref 6–20)
CO2: 24 mmol/L (ref 22–32)
Calcium: 9.2 mg/dL (ref 8.9–10.3)
Chloride: 102 mmol/L (ref 98–111)
Creatinine, Ser: 1.49 mg/dL — ABNORMAL HIGH (ref 0.61–1.24)
GFR calc Af Amer: 58 mL/min — ABNORMAL LOW (ref >60)
GFR calc non Af Amer: 50 mL/min — ABNORMAL LOW (ref >60)
Glucose, Bld: 291 mg/dL — ABNORMAL HIGH (ref 70–99)
Potassium: 4.5 mmol/L (ref 3.5–5.1)
Sodium: 137 mmol/L (ref 135–145)

## 2019-09-30 LAB — CBC
HCT: 36.5 % — ABNORMAL LOW (ref 39.0–52.0)
Hemoglobin: 12.1 g/dL — ABNORMAL LOW (ref 13.0–17.0)
MCH: 30.9 pg (ref 26.0–34.0)
MCHC: 33.2 g/dL (ref 30.0–36.0)
MCV: 93.4 fL (ref 80.0–100.0)
Platelets: 216 10*3/uL (ref 150–400)
RBC: 3.91 MIL/uL — ABNORMAL LOW (ref 4.22–5.81)
RDW: 13.2 % (ref 11.5–15.5)
WBC: 8.7 10*3/uL (ref 4.0–10.5)
nRBC: 0 % (ref 0.0–0.2)

## 2019-09-30 LAB — GLUCOSE, CAPILLARY: Glucose-Capillary: 269 mg/dL — ABNORMAL HIGH (ref 70–99)

## 2019-09-30 MED ORDER — ISOSORBIDE MONONITRATE ER 60 MG PO TB24
60.0000 mg | ORAL_TABLET | Freq: Every day | ORAL | 3 refills | Status: DC
Start: 2019-09-30 — End: 2019-10-08

## 2019-09-30 MED ORDER — INSULIN ASPART 100 UNIT/ML ~~LOC~~ SOLN: 0.0000 [IU] | Freq: Three times a day (TID) | SUBCUTANEOUS | Status: DC

## 2019-09-30 MED ORDER — INSULIN ASPART 100 UNIT/ML ~~LOC~~ SOLN: 0.0000 [IU] | Freq: Every day | SUBCUTANEOUS | Status: DC

## 2019-09-30 MED ORDER — INSULIN GLARGINE 100 UNIT/ML ~~LOC~~ SOLN
10.0000 [IU] | Freq: Every day | SUBCUTANEOUS | Status: DC
  Filled 2019-09-30: qty 0.1

## 2019-09-30 MED ORDER — METFORMIN HCL 1000 MG PO TABS: 1000.0000 mg | ORAL_TABLET | Freq: Two times a day (BID) | ORAL | Status: DC

## 2019-09-30 MED ORDER — CARVEDILOL 3.125 MG PO TABS: 3.1250 mg | ORAL_TABLET | Freq: Two times a day (BID) | ORAL | 3 refills | Status: DC

## 2019-09-30 NOTE — Plan of Care (Signed)

## 2019-09-30 NOTE — Progress Notes (Signed)
Progress Note  Patient Name: [name] Date of Encounter: 09/30/2019  [facility] Cardiologist: Yolonda Kida, MD, Dr. Fletcher Anon  Subjective   Pt has ambulated in halls without issues, feels well, ready to discharge.  Inpatient Medications    Scheduled Meds: . aspirin EC  81 mg Oral Daily  . atorvastatin  80 mg Oral Daily  . carvedilol  3.125 mg Oral BID WC  . clopidogrel  75 mg Oral Daily  . enoxaparin (LOVENOX) injection  40 mg Subcutaneous Q24H  . famotidine  20 mg Oral Daily  . glipiZIDE  10 mg Oral BID AC  . hydrochlorothiazide  25 mg Oral Daily  . insulin aspart  0-15 Units Subcutaneous TID WC  . insulin aspart  0-5 Units Subcutaneous QHS  . insulin glargine  10 Units Subcutaneous Daily  . isosorbide mononitrate  60 mg Oral Daily  . pioglitazone  45 mg Oral Q breakfast  . sodium chloride flush  3 mL Intravenous Q12H  . vitamin B-12  1,000 mcg Oral Daily   Continuous Infusions: . sodium chloride     PRN Meds: sodium chloride, acetaminophen, morphine injection, nitroGLYCERIN, ondansetron (ZOFRAN) IV, sodium chloride flush   Vital Signs    Vitals:   09/30/19 0019 09/30/19 0450 09/30/19 0850 09/30/19 0853  BP: 123/83 (!) 123/93 (!) 154/90   Pulse: 69 66 70 72  Resp:  15 18   Temp: 98 F (36.7 C) 97.7 F (36.5 C) 97.9 F (36.6 C)   TempSrc: Oral Oral Oral   SpO2: 94% 91% 97%   Weight:  96.3 kg    Height:        Intake/Output Summary (Last 24 hours) at 09/30/2019 0907 Last data filed at 09/30/2019 0719 Gross per 24 hour  Intake 915.5 ml  Output 1400 ml  Net -484.5 ml   Last 3 Weights 09/30/2019 09/29/2019 09/29/2019  Weight (lbs) 212 lb 6.4 oz 212 lb 206 lb 12.7 oz  Weight (kg) 96.344 kg 96.163 kg 93.8 kg      Telemetry    Sinus rhythm with HR in the 70-80s, PVCs - Personally Reviewed  ECG    Sinus bradycardia, first degree heart block, let axis deviation, septal Q waves - Personally Reviewed  Physical Exam   GEN: No acute distress.     Neck: No JVD Cardiac: RRR, no murmurs, rubs, or gallops.  Respiratory: Clear to auscultation bilaterally. GI: Soft, nontender, non-distended  MS: No edema; No deformity. Neuro:  Nonfocal  Psych: Normal affect  Right radial C/D/I  Labs    High Sensitivity Troponin:   Recent Labs  Lab 08/31/19 0949 08/31/19 1123 08/31/19 1821  TROPONINIHS 6 7 21*      Chemistry Recent Labs  Lab 09/28/19 0743 09/29/19 1527 09/30/19 0346  NA 132*  --  137  K 4.1  --  4.5  CL 97*  --  102  CO2 22  --  24  GLUCOSE 275*  --  291*  BUN 25*  --  19  CREATININE 1.62* 1.26* 1.49*  CALCIUM 9.7  --  9.2  GFRNONAA 45* >60 50*  GFRAA 53* >60 58*  ANIONGAP 13  --  11     Hematology Recent Labs  Lab 09/28/19 0743 09/29/19 1527 09/30/19 0346  WBC 10.1 10.4 8.7  RBC 4.12* 3.97* 3.91*  HGB 12.9* 12.3* 12.1*  HCT 37.5* 37.1* 36.5*  MCV 91.0 93.5 93.4  MCH 31.3 31.0 30.9  MCHC 34.4 33.2 33.2  RDW  13.1 13.0 13.2  PLT 228 214 216    BNPNo results for input(s): BNP, PROBNP in the last 168 hours.   DDimer No results for input(s): DDIMER in the last 168 hours.   Radiology    CARDIAC CATHETERIZATION  Result Date: 09/29/2019  Prox RCA-2 lesion is 85% stenosed.  Prox RCA to Mid RCA lesion is 50% stenosed.  Dist RCA lesion is 50% stenosed.  Balloon angioplasty was performed.  Prox RCA-1 lesion is 20% stenosed.  A drug-eluting stent was successfully placed using a STENT RESOLUTE ONYX 3.0X26.  Post intervention, there is a 10% residual stenosis.  Post intervention, there is a 0% residual stenosis.  Post intervention, there is a 10% residual stenosis.  RPDA lesion is 60% stenosed.  Successful complex angioplasty, intravascular lithotripsy and drug-eluting stent placement to the proximal right coronary artery.  Extremely difficult procedure due to heavy calcifications, tortuosity and presence of previous stents. Recommendations: Dual antiplatelet therapy indefinitely. I switch metoprolol to  carvedilol due to bradycardia. Recommend aggressive control of risk factors and cardiac rehab. The patient is to follow-up with Dr. Clayborn Bigness as an outpatient after discharge.    Cardiac Studies   Coronary stent intervention 09/29/19:  Prox RCA-2 lesion is 85% stenosed.  Prox RCA to Mid RCA lesion is 50% stenosed.  Dist RCA lesion is 50% stenosed.  Balloon angioplasty was performed.  Prox RCA-1 lesion is 20% stenosed.  A drug-eluting stent was successfully placed using a STENT RESOLUTE ONYX 3.0X26.  Post intervention, there is a 10% residual stenosis.  Post intervention, there is a 0% residual stenosis.  Post intervention, there is a 10% residual stenosis.  RPDA lesion is 60% stenosed.   Successful complex angioplasty, intravascular lithotripsy and drug-eluting stent placement to the proximal right coronary artery.  Extremely difficult procedure due to heavy calcifications, tortuosity and presence of previous stents.  Recommendations: Dual antiplatelet therapy indefinitely. I switch metoprolol to carvedilol due to bradycardia. Recommend aggressive control of risk factors and cardiac rehab.    Left heart cath 08/31/19:  Prox RCA-1 lesion is 99% stenosed.  Prox RCA-2 lesion is 75% stenosed.  Prox RCA to Mid RCA lesion is 50% stenosed.  Dist RCA lesion is 70% stenosed.  Prox Cx lesion is 50% stenosed.  Dist Cx lesion is 50% stenosed.  Mid Cx lesion is 50% stenosed.  Mid LM lesion is 25% stenosed.  Dist LAD-1 lesion is 75% stenosed.  Dist LAD-2 lesion is 75% stenosed.  Dist LAD-3 lesion is 70% stenosed.  Prox LAD to Mid LAD lesion is 50% stenosed with 50% stenosed side branch in 1st Diag.  Dist LAD-4 lesion is 50% stenosed.  A drug-eluting stent was successfully placed using a STENT RESOLUTE ONYX 2.5X15.  Post intervention, there is a 0% residual stenosis.   Plan Diagnostic cardiac cath Preserved left ventricular function 50% Left main mild  irregularities LAD large serial 50% diffuse 50%, patient had serial 75% lesions mid to distal.  Diffuse 50% throughout.  Moderate to severe calcification proximal to mid possible mid stent widely patent. Circumflex is large mixed dominant serial 50% lesions with moderate to severe calcification in the proximal to mid circumflex there is a mid circumflex stent that is widely patent RCA was large mixed dominant 99% proximal lesion there is also a proximal to mid lesion that is about 75% hazy heavy calcification at a band mid RCA stent with patent but diffuse 50% lesion there is also a more distal 50 lesion at the bifurcation of the  PL and PDA After reviewing the films we decided to proceed with intervention of the proximal RCA Successful PCI and stent proximal RCA with DES 2.5 x 15 mm resolute Onyx dilated to 21 atm lesion was reduced from 99 down to 0% More distal lesions were not attempted because of severe calcification and been making it very difficult to access A minx was deployed   Patient Profile     59 y.o. male with a history of CAD, uncontrolled DM2, GERD, HLD, HTN, OSA and tobacco use who presented for scheduled   Assessment & Plan    CAD  - LHC 08/31/19 with nonobstructive disease in the mid LAD and LCx, but 99% stenosis in the proximal RCA treated with DES, previously placed stent with 50% in-stent restenosis - he continued to have angina prompting repeat hart cath which showed proximal RCA with 85% stenosis between previously placed proximal and distal stents, successfully treated with DES - pt tolerated the procedure well and continued on ASA and plavix - imdur restarted at half dose 60 mg - due to bradycardia during procedure, he was switched from lopressor to coreg - HCTZ restarted - he had chest pain following intervention yesterday relieved with morphine x 1 - pt refused nitro due to headache   DM2 - uncontrolled - A1c 12.1% - on glipized and metformin at home - placed on  SSI - given disease and A1c, will start 10 U lantus qAM - he will need to follow up with cards/PCP for titration   Hypertension - medications as above   Hyperlipidemia with LDL goal < 70 09/01/2019: Cholesterol 227; HDL 34; LDL Cholesterol UNABLE TO CALCULATE IF TRIGLYCERIDE OVER 400 mg/dL; Triglycerides 755; VLDL UNABLE TO CALCULATE IF TRIGLYCERIDE OVER 400 mg/dL - 80 mg lipitor - vascepa is a possibility, but will need to check cost - will need to reduce carbohydrate intake    For questions or updates, please contact [place] Please consult [email or web address] for contact info under        Signed, Ledora Bottcher, PA  09/30/2019, 9:07 AM

## 2019-09-30 NOTE — Discharge Summary (Signed)
Discharge Summary    Patient ID: [name] MRN: [id number]; DOB: 1960/03/09  Admit date: 09/29/2019 Discharge date: 09/30/2019  Primary Care Provider: Cyndi Bender, PA-C  Primary Cardiologist: Yolonda Kida, MD, Dr. Fletcher Anon Primary Electrophysiologist:  None   Discharge Diagnoses    Active Problems:   Poorly controlled diabetes mellitus ([place])   Angina, class III ([place])   Status post coronary artery stent placement    Diagnostic Studies/Procedures    Coronary stent intervention 09/29/19:  Prox RCA-2 lesion is 85% stenosed.  Prox RCA to Mid RCA lesion is 50% stenosed.  Dist RCA lesion is 50% stenosed.  Balloon angioplasty was performed.  Prox RCA-1 lesion is 20% stenosed.  A drug-eluting stent was successfully placed using a STENT RESOLUTE ONYX 3.0X26.  Post intervention, there is a 10% residual stenosis.  Post intervention, there is a 0% residual stenosis.  Post intervention, there is a 10% residual stenosis.  RPDA lesion is 60% stenosed.  Successful complex angioplasty, intravascular lithotripsy and drug-eluting stent placement to the proximal right coronary artery. Extremely difficult procedure due to heavy calcifications, tortuosity and presence of previous stents.  Recommendations: Dual antiplatelet therapy indefinitely. I switch metoprolol to carvedilol due to bradycardia. Recommend aggressive control of risk factors and cardiac rehab.  _____________  Left heart cath 08/31/19:  Prox RCA-1 lesion is 99% stenosed.  Prox RCA-2 lesion is 75% stenosed.  Prox RCA to Mid RCA lesion is 50% stenosed.  Dist RCA lesion is 70% stenosed.  Prox Cx lesion is 50% stenosed.  Dist Cx lesion is 50% stenosed.  Mid Cx lesion is 50% stenosed.  Mid LM lesion is 25% stenosed.  Dist LAD-1 lesion is 75% stenosed.  Dist LAD-2 lesion is 75% stenosed.  Dist LAD-3 lesion is 70% stenosed.  Prox LAD to Mid LAD lesion is 50% stenosed with 50% stenosed side  branch in 1st Diag.  Dist LAD-4 lesion is 50% stenosed.  A drug-eluting stent was successfully placed using a STENT RESOLUTE ONYX 2.5X15.  Post intervention, there is a 0% residual stenosis.  Plan Diagnostic cardiac cath Preserved left ventricular function 50% Left main mild irregularities LAD large serial 50% diffuse 50%, patient had serial 75% lesions mid to distal. Diffuse 50% throughout. Moderate to severe calcification proximal to mid possible mid stent widely patent. Circumflex is large mixed dominant serial 50% lesions with moderate to severe calcification in the proximal to mid circumflex there is a mid circumflex stent that is widely patent RCA was large mixed dominant 99% proximal lesion there is also a proximal to mid lesion that is about 75% hazy heavy calcification at a band mid RCA stent with patent but diffuse 50% lesion there is also a more distal 50 lesion at the bifurcation of the PL and PDA After reviewing the films we decided to proceed with intervention of the proximal RCA Successful PCI and stent proximal RCA with DES 2.5 x 15 mm resolute Onyx dilated to 21 atm lesion was reduced from 99 down to 0% More distal lesions were not attempted because of severe calcification and been making it very difficult to access A minx was deployed _____________   History of Present Illness     [name] is a 59 y.o. male with a history of CAD, uncontrolled DM2, GERD, HLD, HTN, OSA and tobacco use who presented for scheduled heart cath.   [name] is a 59 y.o.male patient who recently presented with unstable anginal accelerating symptoms seen in emergency room referred to the  office but was having active pain patient was then admitted subsequent underwent emergent cardiac cath sign of multivessel coronary disease but symptomatic vessels the proximal RCA which was treated with PCI and stent with DES August 31, 2019. Patient since had significant improvement in symptoms still has some  angina with activity he has been controlled on Lipitor Plavix aspirin metoprolol lisinopril but still has some symptoms especially with moderate activity. He also complains of bilateral leg weakness soreness with activity and walking still has dyspnea on exertion chest tightness heaviness he had some yesterday while walking and had to discontinue his walking routine now here for follow-up evaluation with his son.  Hospital Course     Consultants: none  CAD LHC 08/31/19 with nonobstructive disease in the mid LAD and LCx, but 99% stenosis in the proximal RCA treated with DES, previously placed stent with 50% in-stent restenosis. He continued to have angina prompting repeat heart cath which showed proximal RCA with 85% stenosis between previously placed proximal and distal stents, successfully treated with DES. Pt tolerated the procedure well and continued on ASA and plavix. Imdur restarted at half dose 60 mg. Due to bradycardia during procedure, he was switched from lopressor to coreg. HCTZ restarted. He had chest pain following intervention yesterday relieved with morphine x 1 - pt refused nitro due to headache. Per Dr. Fletcher Anon, significant stretching of the artery during procedure, this CP was expected. EKG was stable. He did well overnight and is ready for discharge today. He has ambulated in the hall more than he normally does without CP. Right radial cath site C/D/I.    DM2 - uncontrolled A1c 12.1%. He is working with his PCP on this. Continue home oral medications. He is not an insulin candidate.   Hypertension Medications as above. Titrate as outpatient.    Hyperlipidemia with LDL goal < 70 09/01/2019: Cholesterol 227; HDL 34; LDL Cholesterol UNABLE TO CALCULATE IF TRIGLYCERIDE OVER 400 mg/dL; Triglycerides 755; VLDL UNABLE TO CALCULATE IF TRIGLYCERIDE OVER 400 mg/dL On 80 mg lipitor. Vascepa may be a possibility, but depends on cost. Will need to reduce carb intake.    Pt seen and examined  with Dr. Martin[place] and deemed stable for discharge. I have made follow up with his primary cardiologist at [facility].    Did the patient have an acute coronary syndrome (MI, NSTEMI, STEMI, etc) this admission?:  No                               Did the patient have a percutaneous coronary intervention (stent / angioplasty)?:  Yes.     Cath/PCI Registry Performance & Quality Measures: 1. Aspirin prescribed? - Yes 2. ADP Receptor Inhibitor (Plavix/Clopidogrel, Brilinta/Ticagrelor or Effient/Prasugrel) prescribed (includes medically managed patients)? - Yes 3. High Intensity Statin (Lipitor 40-45m or Crestor 20-426m prescribed? - Yes 4. For EF <40%, was ACEI/ARB prescribed? - Not Applicable (EF >/= 4054%5. For EF <40%, Aldosterone Antagonist (Spironolactone or Eplerenone) prescribed? - Not Applicable (EF >/= 4065%6. Cardiac Rehab Phase II ordered? - Yes   _____________  Discharge Vitals Blood pressure (!) 154/90, pulse 72, temperature 97.9 F (36.6 C), temperature source Oral, resp. rate 18, height 5' 6"  (1.676 m), weight 96.3 kg, SpO2 97 %.  Filed Weights   09/29/19 0700 09/29/19 0731 09/30/19 0450  Weight: 93.8 kg 96.2 kg 96.3 kg    Labs & Radiologic Studies    CBC Recent Labs  09/28/19 0743 09/28/19 0743 09/29/19 1527 09/30/19 0346  WBC 10.1   < > 10.4 8.7  NEUTROABS 5.6  --   --   --   HGB 12.9*   < > 12.3* 12.1*  HCT 37.5*   < > 37.1* 36.5*  MCV 91.0   < > 93.5 93.4  PLT 228   < > 214 216   < > = values in this interval not displayed.   Basic Metabolic Panel Recent Labs    09/28/19 0743 09/28/19 0743 09/29/19 1527 09/30/19 0346  NA 132*  --   --  137  K 4.1  --   --  4.5  CL 97*  --   --  102  CO2 22  --   --  24  GLUCOSE 275*  --   --  291*  BUN 25*  --   --  19  CREATININE 1.62*   < > 1.26* 1.49*  CALCIUM 9.7  --   --  9.2   < > = values in this interval not displayed.   Liver Function Tests No results for input(s): AST, ALT, ALKPHOS, BILITOT,  PROT, ALBUMIN in the last 72 hours. No results for input(s): LIPASE, AMYLASE in the last 72 hours. High Sensitivity Troponin:   Recent Labs  Lab 08/31/19 1123 08/31/19 1821  TROPONINIHS 7 21*    BNP Invalid input(s): POCBNP D-Dimer No results for input(s): DDIMER in the last 72 hours. Hemoglobin A1C No results for input(s): HGBA1C in the last 72 hours. Fasting Lipid Panel No results for input(s): CHOL, HDL, LDLCALC, TRIG, CHOLHDL, LDLDIRECT in the last 72 hours. Thyroid Function Tests No results for input(s): TSH, T4TOTAL, T3FREE, THYROIDAB in the last 72 hours.  Invalid input(s): FREET3 _____________  CARDIAC CATHETERIZATION  Result Date: 09/29/2019  Prox RCA-2 lesion is 85% stenosed.  Prox RCA to Mid RCA lesion is 50% stenosed.  Dist RCA lesion is 50% stenosed.  Balloon angioplasty was performed.  Prox RCA-1 lesion is 20% stenosed.  A drug-eluting stent was successfully placed using a STENT RESOLUTE ONYX 3.0X26.  Post intervention, there is a 10% residual stenosis.  Post intervention, there is a 0% residual stenosis.  Post intervention, there is a 10% residual stenosis.  RPDA lesion is 60% stenosed.  Successful complex angioplasty, intravascular lithotripsy and drug-eluting stent placement to the proximal right coronary artery.  Extremely difficult procedure due to heavy calcifications, tortuosity and presence of previous stents. Recommendations: Dual antiplatelet therapy indefinitely. I switch metoprolol to carvedilol due to bradycardia. Recommend aggressive control of risk factors and cardiac rehab. The patient is to follow-up with Dr. Clayborn Bigness as an outpatient after discharge.   CARDIAC CATHETERIZATION  Result Date: 08/31/2019  Prox RCA-1 lesion is 99% stenosed.  Prox RCA-2 lesion is 75% stenosed.  Prox RCA to Mid RCA lesion is 50% stenosed.  Dist RCA lesion is 70% stenosed.  Prox Cx lesion is 50% stenosed.  Dist Cx lesion is 50% stenosed.  Mid Cx lesion is 50%  stenosed.  Mid LM lesion is 25% stenosed.  Dist LAD-1 lesion is 75% stenosed.  Dist LAD-2 lesion is 75% stenosed.  Dist LAD-3 lesion is 70% stenosed.  Prox LAD to Mid LAD lesion is 50% stenosed with 50% stenosed side branch in 1st Diag.  Dist LAD-4 lesion is 50% stenosed.  A drug-eluting stent was successfully placed using a STENT RESOLUTE ONYX 2.5X15.  Post intervention, there is a 0% residual stenosis.  Plan Diagnostic cardiac cath Preserved left ventricular  function 50% Left main mild irregularities LAD large serial 50% diffuse 50%, patient had serial 75% lesions mid to distal.  Diffuse 50% throughout.  Moderate to severe calcification proximal to mid possible mid stent widely patent. Circumflex is large mixed dominant serial 50% lesions with moderate to severe calcification in the proximal to mid circumflex there is a mid circumflex stent that is widely patent RCA was large mixed dominant 99% proximal lesion there is also a proximal to mid lesion that is about 75% hazy heavy calcification at a band mid RCA stent with patent but diffuse 50% lesion there is also a more distal 50 lesion at the bifurcation of the PL and PDA After reviewing the films we decided to proceed with intervention of the proximal RCA Successful PCI and stent proximal RCA with DES 2.5 x 15 mm resolute Onyx dilated to 21 atm lesion was reduced from 99 down to 0% More distal lesions were not attempted because of severe calcification and been making it very difficult to access A minx was deployed  ECHOCARDIOGRAM COMPLETE  Result Date: 08/31/2019    ECHOCARDIOGRAM REPORT   Patient Name:   [name] Date of Exam: 08/31/2019 Medical Rec #:  [id number]     Height:       66.0 in Accession #:    [id number]    Weight:       220.0 lb Date of Birth:  [birth year]     BSA:          2.082 m Patient Age:    58 years      BP:           144/97 mmHg Patient Gender: M             HR:           80 bpm. Exam Location:  [facility] Procedure: 2D Echo, Color  Doppler, Cardiac Doppler and Intracardiac            Opacification Agent Indications:     I25.10 CAD Native Vessel  History:         Patient has no prior history of Echocardiogram examinations.                  ESRD; Risk Factors:Current Smoker, Sleep Apnea, Hypertension,                  Dyslipidemia and Diabetes.  Sonographer:     Charmayne Sheer RDCS (AE) Referring Phys:  [id number] Collier Bullock Diagnosing Phys: Yolonda Kida MD  Sonographer Comments: Technically difficult study due to poor echo windows. Image acquisition challenging due to patient body habitus and Image acquisition challenging due to respiratory motion. IMPRESSIONS  1. Left ventricular ejection fraction, by estimation, is 50 to 55%. The left ventricle has low Jeff function. The left ventricle has no regional wall motion abnormalities. Left ventricular diastolic parameters are consistent with Grade I diastolic dysfunction (impaired relaxation).  2. Right ventricular systolic function is Jeff. The right ventricular size is Jeff.  3. The mitral valve is Jeff in structure. Trivial mitral valve regurgitation.  4. The aortic valve is grossly Jeff. Aortic valve regurgitation is not visualized. FINDINGS  Left Ventricle: Left ventricular ejection fraction, by estimation, is 50 to 55%. The left ventricle has low Jeff function. The left ventricle has no regional wall motion abnormalities. Definity contrast agent was given IV to delineate the left ventricular endocardial borders. The left ventricular internal cavity size was Jeff in size. There is borderline  left ventricular hypertrophy. Left ventricular diastolic parameters are consistent with Grade I diastolic dysfunction (impaired relaxation). Right Ventricle: The right ventricular size is Jeff. No increase in right ventricular wall thickness. Right ventricular systolic function is Jeff. Left Atrium: Left atrial size was Jeff in size. Right Atrium: Right atrial size was Jeff in size.  Pericardium: There is no evidence of pericardial effusion. Mitral Valve: The mitral valve is Jeff in structure. Trivial mitral valve regurgitation. MV peak gradient, 3.4 mmHg. The mean mitral valve gradient is 2.0 mmHg. Tricuspid Valve: The tricuspid valve is Jeff in structure. Tricuspid valve regurgitation is trivial. Aortic Valve: The aortic valve is grossly Jeff. Aortic valve regurgitation is not visualized. Aortic valve mean gradient measures 2.0 mmHg. Aortic valve peak gradient measures 3.7 mmHg. Aortic valve area, by VTI measures 3.00 cm. Pulmonic Valve: The pulmonic valve was Jeff in structure. Pulmonic valve regurgitation is not visualized. Aorta: The aortic root is Jeff in size and structure. IAS/Shunts: No atrial level shunt detected by color flow Doppler.  LEFT VENTRICLE PLAX 2D LVIDd:         4.09 cm  Diastology LVIDs:         2.92 cm  LV e' lateral:   6.31 cm/s LV PW:         1.23 cm  LV E/e' lateral: 8.0 LV IVS:        0.97 cm  LV e' medial:    5.11 cm/s LVOT diam:     2.30 cm  LV E/e' medial:  9.9 LV SV:         51 LV SV Index:   24 LVOT Area:     4.15 cm  LEFT ATRIUM         Index LA diam:    2.60 cm 1.25 cm/m  AORTIC VALVE                   PULMONIC VALVE AV Area (Vmax):    3.17 cm    PV Vmax:       0.92 m/s AV Area (Vmean):   3.05 cm    PV Vmean:      58.900 cm/s AV Area (VTI):     3.00 cm    PV VTI:        0.126 m AV Vmax:           95.70 cm/s  PV Peak grad:  3.4 mmHg AV Vmean:          71.100 cm/s PV Mean grad:  2.0 mmHg AV VTI:            0.169 m AV Peak Grad:      3.7 mmHg AV Mean Grad:      2.0 mmHg LVOT Vmax:         73.10 cm/s LVOT Vmean:        52.200 cm/s LVOT VTI:          0.122 m LVOT/AV VTI ratio: 0.72  AORTA Ao Root diam: 4.00 cm MITRAL VALVE MV Area (PHT): 6.02 cm    SHUNTS MV Peak grad:  3.4 mmHg    Systemic VTI:  0.12 m MV Mean grad:  2.0 mmHg    Systemic Diam: 2.30 cm MV Vmax:       0.92 m/s MV Vmean:      64.1 cm/s MV Decel Time: 126 msec MV E velocity: 50.60 cm/s  MV A velocity: 76.70 cm/s MV E/A ratio:  0.66 Yolonda Kida MD Electronically  signed by Yolonda Kida MD Signature Date/Time: 08/31/2019/1:30:04 PM    Final    Disposition   Pt is being discharged home today in good condition.  Follow-up Plans & Appointments     Follow-up Information    Yolonda Kida, MD Follow up on 10/06/2019.   Specialties: Cardiology, Internal Medicine Why: 3:15 pm Contact information: [place] [address] [phone]              Discharge Instructions    Amb Referral to Cardiac Rehabilitation   Complete by: As directed    Diagnosis:  Coronary Stents PTCA     After initial evaluation and assessments completed: Virtual Based Care may be provided alone or in conjunction with Phase 2 Cardiac Rehab based on patient barriers.: Yes   Diet - low sodium heart healthy   Complete by: As directed    Discharge instructions   Complete by: As directed    No driving for 2 days. No lifting over 5 lbs for 1 week. No sexual activity for 1 week. You may return to work in 1 week. Keep procedure site clean & dry. If you notice increased pain, swelling, bleeding or pus, call/return!  You may shower, but no soaking baths/hot tubs/pools for 1 week.   Increase activity slowly   Complete by: As directed       Discharge Medications   Allergies as of 09/30/2019      Reactions   Shellfish Allergy Anaphylaxis   Penicillins Swelling   Childhood   Erythromycin Rash, Other (See Comments)   Unknown      Medication List    STOP taking these medications   ibuprofen 200 MG tablet Commonly known as: ADVIL   metoprolol tartrate 25 MG tablet Commonly known as: LOPRESSOR Replaced by: carvedilol 3.125 MG tablet     TAKE these medications   aspirin EC 81 MG tablet Take 81 mg by mouth daily.   atorvastatin 80 MG tablet Commonly known as: LIPITOR Take 80 mg by mouth daily.   carvedilol 3.125 MG tablet Commonly known as: COREG Take 1  tablet (3.125 mg total) by mouth 2 (two) times daily with a meal. Replaces: metoprolol tartrate 25 MG tablet   Cinnamon 500 MG capsule Take 1,000 mg by mouth daily.   clopidogrel 75 MG tablet Commonly known as: PLAVIX Take 75 mg by mouth daily.   EPINEPHrine 0.3 mg/0.3 mL Soaj injection Commonly known as: EPI-PEN Inject 0.3 mg into the muscle as needed for anaphylaxis.   famotidine 20 MG tablet Commonly known as: PEPCID Take 20 mg by mouth daily.   glipiZIDE 10 MG tablet Commonly known as: GLUCOTROL Take 2 tablets (20 mg total) by mouth 2 (two) times daily before a meal. What changed: how much to take   hydrochlorothiazide 25 MG tablet Commonly known as: HYDRODIURIL Take 1 tablet (25 mg total) by mouth daily.   isosorbide mononitrate 60 MG 24 hr tablet Commonly known as: IMDUR Take 1 tablet (60 mg total) by mouth daily. What changed:   medication strength  how much to take   metFORMIN 1000 MG tablet Commonly known as: GLUCOPHAGE Take 1 tablet (1,000 mg total) by mouth 2 (two) times daily with a meal. Restart on 10/02/19. What changed: additional instructions   nitroGLYCERIN 0.4 MG SL tablet Commonly known as: NITROSTAT Place 0.4 mg under the tongue every 5 (five) minutes as needed for chest pain.   pioglitazone 45 MG tablet Commonly known as: ACTOS Take  45 mg by mouth daily.   vitamin B-12 1000 MCG tablet Commonly known as: CYANOCOBALAMIN Take 1,000 mcg by mouth daily.          Outstanding Labs/Studies   DM control   Duration of Discharge Encounter   Greater than 30 minutes including physician time.  Signed, Tami Lin Rayshell Goecke, PA 09/30/2019, 10:31 AM

## 2019-09-30 NOTE — Progress Notes (Addendum)
CARDIAC REHAB PHASE I   PRE:  Rate/Rhythm: 76 SR with PVCs    BP: sitting 143/82    SaO2:   MODE:  Ambulation: 870 ft   POST:  Rate/Rhythm: 89 SR with PVCs    BP: sitting 154/90     SaO2: 97 RA   Tolerated well. Sts PTA could only walk 100 ft before he had angina. Discussed stent, Plavix, restrictions, diet, exercise, NTG, and CRPII. Voiced understanding. His stent card is missing from the room. Reinforced importance of DM control and exercise. Will refer to [place] for virtual since his job limits availability. Pt is interested in participating in Virtual Cardiac and Pulmonary Rehab. Pt advised that Virtual Cardiac and Pulmonary Rehab is provided at no cost to the patient.  Checklist:  1. Pt has smart device  ie smartphone and/or ipad for downloading an app  Yes 2. Reliable internet/wifi service    Yes 3. Understands how to use their smartphone and navigate within an app.  Yes Pt verbalized understanding and is in agreement. [place], ACSM 09/30/2019 9:24 AM   Returned and discussed smoking cessation with pt. He is thinking of quitting. Discussed benefits and methods of quitting. Pt receptive. [phone] Yves Dill CES, ACSM 10:17 AM 09/30/2019

## 2019-09-30 NOTE — Progress Notes (Signed)
Discharge instructions including but not limited to f/u appmts, meds, site care, when to call MD & tobacco cessation given to pt. All questions answered & information verified via teachback method. All belongings gathered & given to pt. Hoover Brunette, RN

## 2019-09-30 NOTE — Progress Notes (Signed)
Removed TR band from right radial. Site is a level 0. Applied gauze and Tegaderm to site. Educated pt to leave in place for 24 hrs. Pt verbalized understanding. °

## 2019-10-01 ENCOUNTER — Telehealth: Payer: Self-pay | Admitting: Cardiovascular Disease

## 2019-10-01 NOTE — Telephone Encounter (Signed)
Patient calling  States that Dr Fletcher Anon preformed a cath for him recently  Patient is trying to get Short Term disability and would like to know if Dr Fletcher Anon will be able to complete this paperwork Please call to discuss

## 2019-10-01 NOTE — Telephone Encounter (Signed)
Patient would like to remain with Dr Fletcher Anon as his primary cardiologist. Former patient of Dr Clayborn Bigness at [place]. Dr Fletcher Anon did heart cath this past Wednesday.  Needs to get the disability forms filled out as soon as possible as he's been out of work since August 20, 2019. He has not had any income since July 2nd and needs to get paid.  He went by Dr Etta Quill office yesterday and signed release of medical records for Dr Fletcher Anon to have permission to view. Dr Clayborn Bigness also said Dr Fletcher Anon could fill out the forms since he did the procedure.   Advised patient to bring the forms up here (he can do that today) and I will make sure to get them to Dr Tyrell Antonio nurse. Scheduled patient for follow up appointment with Ignacia Bayley, NP on 10/08/19. He is aware to have only one visitor and to arrive 15-20 minutes early to the [place] and then come down to the [facility].  Routing to [place], Therapist, sports for further follow up.

## 2019-10-01 NOTE — Telephone Encounter (Signed)
Patient dropped off paperwork to be completed Placed in nurse box

## 2019-10-04 NOTE — Telephone Encounter (Addendum)
Paperwork received and placed on Dr. Tyrell Antonio desk to be completed.

## 2019-10-05 NOTE — Telephone Encounter (Signed)
Patient made aware that his disability paperwork has been completed and signed by Dr. Fletcher Anon and is ready to be picked up.  Patient sts that he will come by the office to pick it up today. Adv him that it will be left at the front desk.  Patient verbalized understanding and voiced appreciation for the assistance.

## 2019-10-08 ENCOUNTER — Other Ambulatory Visit: Payer: Self-pay

## 2019-10-08 ENCOUNTER — Ambulatory Visit (INDEPENDENT_AMBULATORY_CARE_PROVIDER_SITE_OTHER): Payer: 59 | Admitting: Nurse Practitioner

## 2019-10-08 ENCOUNTER — Encounter: Payer: Self-pay | Admitting: Nurse Practitioner

## 2019-10-08 VITALS — BP 122/86 | HR 83 | Ht 66.0 in | Wt 212.8 lb

## 2019-10-08 DIAGNOSIS — I10 Essential (primary) hypertension: Secondary | ICD-10-CM | POA: Diagnosis not present

## 2019-10-08 DIAGNOSIS — E1159 Type 2 diabetes mellitus with other circulatory complications: Secondary | ICD-10-CM

## 2019-10-08 DIAGNOSIS — E785 Hyperlipidemia, unspecified: Secondary | ICD-10-CM

## 2019-10-08 DIAGNOSIS — I25118 Atherosclerotic heart disease of native coronary artery with other forms of angina pectoris: Secondary | ICD-10-CM

## 2019-10-08 MED ORDER — ISOSORBIDE MONONITRATE ER 60 MG PO TB24: 90.0000 mg | ORAL_TABLET | Freq: Every day | ORAL | 5 refills | Status: DC

## 2019-10-08 NOTE — Patient Instructions (Addendum)
Medication Instructions:  Your physician has recommended you make the following change in your medication:   INCREASE Isosorbide (Imdur) to 90 mg (1 and 1/2 tablet) daily. An Rx has been sent to your pharmacy.   *If you need a refill on your cardiac medications before your next appointment, please call your pharmacy*   Lab Work: None ordered If you have labs (blood work) drawn today and your tests are completely normal, you will receive your results only by: Marland Kitchen MyChart Message (if you have MyChart) OR . A paper copy in the mail If you have any lab test that is abnormal or we need to change your treatment, we will call you to review the results.   Testing/Procedures: None ordered   Follow-Up: At [facility], you and your health needs are our priority.  As part of our continuing mission to provide you with exceptional heart care, we have created designated Provider Care Teams.  These Care Teams include your primary Cardiologist (physician) and Advanced Practice Providers (APPs -  Physician Assistants and Nurse Practitioners) who all work together to provide you with the care you need, when you need it.  We recommend signing up for the patient portal called "MyChart".  Sign up information is provided on this After Visit Summary.  MyChart is used to connect with patients for Virtual Visits (Telemedicine).  Patients are able to view lab/test results, encounter notes, upcoming appointments, etc.  Non-urgent messages can be sent to your provider as well.   To learn more about what you can do with MyChart, go to [email or web address].    Your next appointment:   Next week Tues 10/12/19 @ 1:20 pm with Dr. Fletcher Anon  The format for your next appointment:   In Person  Provider:    You may see Kathlyn Sacramento, MD or one of the following Advanced Practice Providers on your designated Care Team:    Murray Hodgkins, NP  Christell Faith, PA-C  Marrianne Mood, PA-C    Other  Instructions N/A

## 2019-10-08 NOTE — Progress Notes (Signed)
Office Visit    Patient Name: [name] Date of Encounter: 10/08/2019  Primary Care Provider:  Cyndi Bender, PA-C Primary Cardiologist:  Kathlyn Sacramento, MD  Chief Complaint    59 year old male with a history of CAD status post RCA stenting, hypertension, hyperlipidemia, obesity, sleep apnea, tobacco abuse, and GERD, who presents for follow-up after recent PCI drug-eluting stent placement to the RCA with ongoing angina.  Past Medical History    Past Medical History:  Diagnosis Date  . Coronary artery disease   . Diabetes mellitus without complication ([place])   . GERD (gastroesophageal reflux disease)   . Hyperlipidemia   . Hypertension   . MI, acute, non ST segment elevation ([place])   . NSTEMI (non-ST elevated myocardial infarction) ([place])   . Sleep apnea   . Tobacco abuse    Past Surgical History:  Procedure Laterality Date  . APPENDECTOMY    . CARDIAC CATHETERIZATION    . COLONOSCOPY    . COLONOSCOPY WITH PROPOFOL N/A 09/16/2017   Procedure: COLONOSCOPY WITH PROPOFOL;  Surgeon: Toledo, Benay Pike, MD;  Location: [facility] ENDOSCOPY;  Service: Gastroenterology;  Laterality: N/A;  (+) DM - oral  . CORONARY STENT INTERVENTION N/A 08/31/2019   Procedure: CORONARY STENT INTERVENTION;  Surgeon: Yolonda Kida, MD;  Location: [place] [facility];  Service: Cardiovascular;  Laterality: N/A;  . CORONARY STENT INTERVENTION  09/29/2019  . CORONARY STENT INTERVENTION N/A 09/29/2019   Procedure: CORONARY STENT INTERVENTION;  Surgeon: Wellington Hampshire, MD;  Location: [place] [facility];  Service: Cardiovascular;  Laterality: N/A;  . EXPLORATORY LAPAROTOMY    . EYE SURGERY    . HERNIA REPAIR    . LEFT HEART CATH AND CORONARY ANGIOGRAPHY N/A 08/31/2019   Procedure: LEFT HEART CATH AND CORONARY ANGIOGRAPHY possible PCI and stent;  Surgeon: Yolonda Kida, MD;  Location: [place] [facility];  Service: Cardiovascular;  Laterality: N/A;    Allergies  Allergies  Allergen Reactions    . Shellfish Allergy Anaphylaxis  . Penicillins Swelling    Childhood  . Erythromycin Rash and Other (See Comments)    Unknown    History of Present Illness    59 year old male with the above past medical history including CAD, diabetes, GERD, hypertension, hyperlipidemia, sleep apnea, obesity, and tobacco abuse.  He previously suffered non-STEMI underwent stenting of the mid right coronary artery.  In July of this year, in the setting of unstable angina, he underwent repeat diagnostic catheterization revealing moderate diffuse disease throughout his left coronary tree with severe proximal RCA disease.  The most proximal lesion was successfully treated with a drug-eluting stent on July 13 while he had residual moderate to severe stenosis in the proximal to mid vessel which was felt to be heavily calcified.  Previously placed mid RCA stent had 50% stenosis.  Unfortunately, he continued to have exertional angina following PCI and he underwent repeat catheterization on August 11 with successful intravascular lithotripsy and drug-eluting stent placement in the proximal RCA.  Previously placed stents remain patent.  Patient says the following PCI, he felt well for about 1 day but since, has been experiencing exertional substernal chest pressure radiating to his left arm and scapula, along with dyspnea on exertion.  Symptoms typically last for a few minutes and resolve with rest.  Some days are better than others in the sense that he may be able to exert more without any symptoms.  He denies PND, orthopnea, dizziness, syncope, edema, early satiety, or palpitations.  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  Home Medications    Prior to Admission medications   Medication Sig Start Date End Date Taking? Authorizing Provider  aspirin EC 81 MG tablet Take 81 mg by mouth daily.   Yes [provider]  atorvastatin (LIPITOR) 80 MG tablet Take 80 mg by mouth  daily.   Yes [provider]  carvedilol (COREG) 3.125 MG tablet Take 1 tablet (3.125 mg total) by mouth 2 (two) times daily with a meal. 09/30/19  Yes Duke, Tami Lin, PA  Cinnamon 500 MG capsule Take 1,000 mg by mouth daily.   Yes [provider]  clopidogrel (PLAVIX) 75 MG tablet Take 75 mg by mouth daily.   Yes [provider]  EPINEPHrine 0.3 mg/0.3 mL IJ SOAJ injection Inject 0.3 mg into the muscle as needed for anaphylaxis. 06/10/15  Yes [provider]  famotidine (PEPCID) 20 MG tablet Take 20 mg by mouth daily.   Yes [provider]  glipiZIDE (GLUCOTROL) 10 MG tablet Take 2 tablets (20 mg total) by mouth 2 (two) times daily before a meal. Patient taking differently: Take 10 mg by mouth 2 (two) times daily before a meal.  09/01/19 10/08/19 Yes Wyvonnia Dusky, MD  hydrochlorothiazide (HYDRODIURIL) 25 MG tablet Take 1 tablet (25 mg total) by mouth daily. 09/28/19  Yes Wellington Hampshire, MD  isosorbide mononitrate (IMDUR) 60 MG 24 hr tablet Take 1 tablet (60 mg total) by mouth daily. 09/30/19  Yes Duke, Tami Lin, PA  metFORMIN (GLUCOPHAGE) 1000 MG tablet Take 1 tablet (1,000 mg total) by mouth 2 (two) times daily with a meal. Restart on 10/02/19. 09/30/19  Yes Duke, Tami Lin, PA  nitroGLYCERIN (NITROSTAT) 0.4 MG SL tablet Place 0.4 mg under the tongue every 5 (five) minutes as needed for chest pain.  08/19/19  Yes [provider]  pioglitazone (ACTOS) 45 MG tablet Take 45 mg by mouth daily.   Yes [provider]  vitamin B-12 (CYANOCOBALAMIN) 1000 MCG tablet Take 1,000 mcg by mouth daily.   Yes [provider]    Review of Systems    Recurrent exertional chest pressure and dyspnea as outlined above.  He denies palpitations, PND, orthopnea, dizziness, syncope, edema, or early satiety.  All other systems reviewed and are otherwise negative except as noted above.  Physical Exam    VS:  BP 122/86   Pulse 83    Ht 5\' 6"  (1.676 m)   Wt 212 lb 12.8 oz (96.5 kg)   SpO2 96%   BMI 34.35 kg/m  , BMI Body mass index is 34.35 kg/m. GEN: Obese, in no acute distress. HEENT: normal. Neck: Supple, obese, difficult to gauge JVP.  No carotid bruits, or masses. Cardiac: RRR, no murmurs, rubs, or gallops. No clubbing, cyanosis, edema.  Radials/PT 2+ and equal bilaterally.  Right radial catheterization site without bleeding, bruit, or hematoma. Respiratory:  Respirations regular and unlabored, diminished breath sounds bilaterally. GI: Obese, soft, nontender, nondistended, BS + x 4. MS: no deformity or atrophy. Skin: warm and dry, no rash. Neuro:  Strength and sensation are intact. Psych: Normal affect.  Accessory Clinical Findings    ECG personally reviewed by me today -regular sinus rhythm, 83, left axis deviation- no acute changes.  Lab Results  Component Value Date   WBC 8.7 09/30/2019   HGB 12.1 (L) 09/30/2019   HCT 36.5 (L) 09/30/2019   MCV 93.4  09/30/2019   PLT 216 09/30/2019   Lab Results  Component Value Date   CREATININE 1.49 (H) 09/30/2019   BUN 19 09/30/2019   NA 137 09/30/2019   K 4.5 09/30/2019   CL 102 09/30/2019   CO2 24 09/30/2019   Lab Results  Component Value Date   ALT 20 07/07/2014   AST 22 07/07/2014   ALKPHOS 51 07/07/2014   BILITOT 0.4 07/07/2014   Lab Results  Component Value Date   CHOL 227 (H) 09/01/2019   HDL 34 (L) 09/01/2019   LDLCALC UNABLE TO CALCULATE IF TRIGLYCERIDE OVER 400 mg/dL 09/01/2019   LDLDIRECT 71.5 09/01/2019   TRIG 755 (H) 09/01/2019   CHOLHDL 6.7 09/01/2019    Lab Results  Component Value Date   HGBA1C 12.1 (H) 08/31/2019    Assessment & Plan    1.  CAD with stable angina: Status post catheterization in July revealing 2 locations of severe proximal RCA disease.  The most proximal disease was treated in July and due to recurrent symptoms, he underwent successful intravascular lithotripsy and drug-eluting stent placement at [facility] on  August 11.  Unfortunately, he has continued to have intermittent exertional chest discomfort with radiation to the left arm and scapula, along with dyspnea on exertion.  He does not experience symptoms at rest and some days he does not experience symptoms at all.  He is disappointed that he is having recurrent symptoms.  He has been compliant with medications.  I discussed his case with Dr. Fletcher Anon today.  I am going to titrate his isosorbide mononitrate to 90 mg daily and reevaluate next week.  He is not sure that he would be able to afford Ranexa.  He does have moderate diffuse disease throughout his left coronary tree and may ultimately require relook catheterization but would like to optimize medical therapy first (A1c 12.1).  He has already been referred to cardiac rehabilitation and I have encouraged him to enroll.  He remains on aspirin, statin, beta-blocker, Plavix.  He does have sublingual nitrates at home as needed.  He is concerned about not being able to return to work and I have provided him with a note to excuse him for the next month.  Ultimately, he may require disability.  2.  Essential hypertension: Stable.  3.  Hyperlipidemia/hypertriglyceridemia: Total cholesterol 227 with a direct LDL of 71.5 on July 14.  Remains on high potency statin therapy.  Depending on ability to afford, can consider Vascepa in the future.  Lifestyle modifications recommended.  4.  Type 2 diabetes mellitus: Poorly controlled with a hemoglobin A1c of 12.1.  Encouraged him to work with his primary care provider to improve glucose management as in the long run, this may prevent him from being a suitable operative candidate.  5.  Tobacco abuse: Cessation advised.  6.  Morbid obesity: Will benefit from cardiac rehabilitation.  7.  Disposition: Follow-up in 1 week to reevaluate symptoms.  Murray Hodgkins, NP 10/08/2019, 12:42 PM

## 2019-10-12 ENCOUNTER — Ambulatory Visit (INDEPENDENT_AMBULATORY_CARE_PROVIDER_SITE_OTHER): Payer: 59 | Admitting: Cardiovascular Disease

## 2019-10-12 ENCOUNTER — Other Ambulatory Visit: Payer: Self-pay

## 2019-10-12 ENCOUNTER — Encounter: Payer: Self-pay | Admitting: Cardiovascular Disease

## 2019-10-12 ENCOUNTER — Telehealth: Payer: Self-pay

## 2019-10-12 VITALS — BP 114/74 | HR 86 | Ht 66.0 in | Wt 213.0 lb

## 2019-10-12 DIAGNOSIS — I10 Essential (primary) hypertension: Secondary | ICD-10-CM | POA: Diagnosis not present

## 2019-10-12 DIAGNOSIS — E782 Mixed hyperlipidemia: Secondary | ICD-10-CM | POA: Diagnosis not present

## 2019-10-12 DIAGNOSIS — I209 Angina pectoris, unspecified: Secondary | ICD-10-CM | POA: Diagnosis not present

## 2019-10-12 DIAGNOSIS — Z720 Tobacco use: Secondary | ICD-10-CM

## 2019-10-12 DIAGNOSIS — I25118 Atherosclerotic heart disease of native coronary artery with other forms of angina pectoris: Secondary | ICD-10-CM | POA: Diagnosis not present

## 2019-10-12 NOTE — Progress Notes (Signed)
Cardiology Office Note   Date:  10/12/2019   ID:  [name], DOB 1960/04/18, MRN [id number]  PCP:  Cyndi Bender, PA-C  Cardiologist:  Kathlyn Sacramento, MD   Chief Complaint  Patient presents with   Other    1 week follow up. patient c/o some SOB and chest pain. meds reviewed verbally with patient.       History of Present Illness: [name] is a 59 y.o. male who presents for a follow-up visit regarding coronary artery disease and stable angina. He has known history of uncontrolled diabetes, essential hypertension, hyperlipidemia, obesity, sleep apnea, tobacco use and GERD. He has remote history of non-STEMI status post stenting of the mid right coronary artery.  He presented in July with unstable angina.  He underwent cardiac catheterization which showed patent LAD stent with mild restenosis.  There was 80% stenosis in the distal LAD, 80% stenosis in the ramus branch and moderate left circumflex disease.  The RCA had severe proximal and mid disease with significant in-stent restenosis in the midsegment.  The proximal portion was heavily calcified and tortuous.  There was moderate distal RCA stenosis and 60 to 70% ostial right PDA stenosis.  Dr. Clayborn Bigness attempted RCA PCI but was able to place only a proximal RCA stent.  No other balloons could not be passed in the proximal to mid segment which was left to be treated medically but the patient continued to have angina.  Thus, the patient was referred to me to attempt RCA PCI.  This was done at [facility] on August 11.  The procedure was extremely difficult due to calcifications and tortuosity but I was able to do intravascular lithotripsy and drug eluting stent placement to the proximal right coronary artery.  The presence of the most proximal stent made the procedure extremely difficult especially that it was likely undersized at 2.5 mm.  It was postdilated with 3.25 noncompliant balloon.  In spite of successful PCI, he reports improvement  in angina.  He gets substernal chest discomfort and shortness of breath with exertion radiating to his left arm.  This is almost happening on a daily basis and partially responsive to sublingual nitroglycerin.  He takes his medications regularly.  Unfortunately, he continues to smoke a little less than 1 pack/day.  In addition, his diabetes is still uncontrolled.   Past Medical History:  Diagnosis Date   Coronary artery disease    Diabetes mellitus without complication (HCC)    GERD (gastroesophageal reflux disease)    Hyperlipidemia    Hypertension    MI, acute, non ST segment elevation ([facility])    NSTEMI (non-ST elevated myocardial infarction) ([place])    Sleep apnea    Tobacco abuse     Past Surgical History:  Procedure Laterality Date   APPENDECTOMY     CARDIAC CATHETERIZATION     COLONOSCOPY     COLONOSCOPY WITH PROPOFOL N/A 09/16/2017   Procedure: COLONOSCOPY WITH PROPOFOL;  Surgeon: Toledo, Benay Pike, MD;  Location: [facility] ENDOSCOPY;  Service: Gastroenterology;  Laterality: N/A;  (+) DM - oral   CORONARY STENT INTERVENTION N/A 08/31/2019   Procedure: CORONARY STENT INTERVENTION;  Surgeon: Yolonda Kida, MD;  Location: [place] [facility];  Service: Cardiovascular;  Laterality: N/A;   CORONARY STENT INTERVENTION  09/29/2019   CORONARY STENT INTERVENTION N/A 09/29/2019   Procedure: CORONARY STENT INTERVENTION;  Surgeon: Wellington Hampshire, MD;  Location: [place] [facility];  Service: Cardiovascular;  Laterality: N/A;   EXPLORATORY  LAPAROTOMY     EYE SURGERY     HERNIA REPAIR     LEFT HEART CATH AND CORONARY ANGIOGRAPHY N/A 08/31/2019   Procedure: LEFT HEART CATH AND CORONARY ANGIOGRAPHY possible PCI and stent;  Surgeon: Yolonda Kida, MD;  Location: [place] [facility];  Service: Cardiovascular;  Laterality: N/A;     Current Outpatient Medications  Medication Sig Dispense Refill   aspirin EC 81 MG tablet Take 81 mg by mouth daily.     atorvastatin  (LIPITOR) 80 MG tablet Take 80 mg by mouth daily.     carvedilol (COREG) 3.125 MG tablet Take 1 tablet (3.125 mg total) by mouth 2 (two) times daily with a meal. 180 tablet 3   Cinnamon 500 MG capsule Take 1,000 mg by mouth daily.     clopidogrel (PLAVIX) 75 MG tablet Take 75 mg by mouth daily.     EPINEPHrine 0.3 mg/0.3 mL IJ SOAJ injection Inject 0.3 mg into the muscle as needed for anaphylaxis.     famotidine (PEPCID) 20 MG tablet Take 20 mg by mouth daily.     glipiZIDE (GLUCOTROL) 10 MG tablet Take 2 tablets (20 mg total) by mouth 2 (two) times daily before a meal. (Patient taking differently: Take 10 mg by mouth 2 (two) times daily before a meal. ) 120 tablet 0   hydrochlorothiazide (HYDRODIURIL) 25 MG tablet Take 1 tablet (25 mg total) by mouth daily.     isosorbide mononitrate (IMDUR) 60 MG 24 hr tablet Take 1.5 tablets (90 mg total) by mouth daily. 45 tablet 5   metFORMIN (GLUCOPHAGE) 1000 MG tablet Take 1 tablet (1,000 mg total) by mouth 2 (two) times daily with a meal. Restart on 10/02/19.     nitroGLYCERIN (NITROSTAT) 0.4 MG SL tablet Place 0.4 mg under the tongue every 5 (five) minutes as needed for chest pain.      pioglitazone (ACTOS) 45 MG tablet Take 45 mg by mouth daily.     vitamin B-12 (CYANOCOBALAMIN) 1000 MCG tablet Take 1,000 mcg by mouth daily.     No current facility-administered medications for this visit.    Allergies:   Shellfish allergy, Penicillins, and Erythromycin    Social History:  The patient  reports that he has been smoking cigarettes. He has been smoking about 1.00 pack per day. He has never used smokeless tobacco. He reports that he does not drink alcohol and does not use drugs.   Family History:  The patient's family history includes Diabetes in his father; Heart attack in his father; Heart disease in his father; Hypertension in his father.    ROS:  Please see the history of present illness.   Otherwise, review of systems are positive for  none.   All other systems are reviewed and negative.    PHYSICAL EXAM: VS:  BP 114/74 (BP Location: Left Arm, Patient Position: Sitting, Cuff Size: Normal)    Pulse 86    Ht 5\' 6"  (1.676 m)    Wt 213 lb (96.6 kg)    SpO2 93%    BMI 34.38 kg/m  , BMI Body mass index is 34.38 kg/m. GEN: Well nourished, well developed, in no acute distress  HEENT: normal  Neck: no JVD, carotid bruits, or masses Cardiac: RRR; no murmurs, rubs, or gallops,no edema  Respiratory:  clear to auscultation bilaterally, normal work of breathing GI: soft, nontender, nondistended, + BS MS: no deformity or atrophy  Skin: warm and dry, no rash Neuro:  Strength and sensation  are intact Psych: euthymic mood, full affect   EKG:  EKG is ordered today. The ekg ordered today demonstrates normal sinus rhythm with left axis deviation.  No significant ST or T wave changes.   Recent Labs: 09/30/2019: BUN 19; Creatinine, Ser 1.49; Hemoglobin 12.1; Platelets 216; Potassium 4.5; Sodium 137    Lipid Panel    Component Value Date/Time   CHOL 227 (H) 09/01/2019 0450   TRIG 755 (H) 09/01/2019 0450   HDL 34 (L) 09/01/2019 0450   CHOLHDL 6.7 09/01/2019 0450   VLDL UNABLE TO CALCULATE IF TRIGLYCERIDE OVER 400 mg/dL 09/01/2019 0450   LDLCALC UNABLE TO CALCULATE IF TRIGLYCERIDE OVER 400 mg/dL 09/01/2019 0450   LDLDIRECT 71.5 09/01/2019 0450      Wt Readings from Last 3 Encounters:  10/12/19 213 lb (96.6 kg)  10/08/19 212 lb 12.8 oz (96.5 kg)  09/30/19 212 lb 6.4 oz (96.3 kg)      No flowsheet data found.    ASSESSMENT AND PLAN:  1.  Coronary artery disease involving native coronary arteries with stable angina: The patient's symptoms did not improve after recent complex RCA PCI.  His current symptoms are suggestive of class III angina in spite of maximal antianginal medication.  Differential diagnosis include an acute complication with the placed stents recently or possible angina related to small vessel disease as he  has diffuse diabetic vessels. Given his symptoms, I recommend relook coronary angiography and possible PCI.  I discussed the procedure in details as well as risk and benefits.  Patient and family had a lot of questions about management options including CABG.  All of this will be determined after his cardiac catheterization. I also discussed with him my concerns about the lack of risk factor control on him including continued tobacco use and uncontrolled diabetes.  2.  Mixed hyperlipidemia: Most recent lipid profile showed triglyceride above 700.  His LDL was 71.  Continue atorvastatin.  Elevated triglyceride is likely due to uncontrolled diabetes.  Vascepa can be considered.  3.  Essential hypertension: Blood pressures controlled on current medications  4.  Tobacco use: I discussed with him the importance of smoking cessation.  5.  Disability determination: The patient works as a Administrator.  I explained to him that with continued angina, he likely cannot continue working as a Administrator.  The patient is interested in applying for disability but that will be determined based on the results of cardiac catheterization.    Disposition:   FU with me in 1 month  Signed,  Kathlyn Sacramento, MD  10/12/2019 1:30 PM    [place]

## 2019-10-12 NOTE — Patient Instructions (Addendum)
Medication Instructions:  Your physician recommends that you continue on your current medications as directed. Please refer to the Current Medication list given to you today.  *If you need a refill on your cardiac medications before your next appointment, please call your pharmacy*   Lab Work: [facility] [employer] will need a COVID test on Thurs 10/14/19 between 8am-1pm. Please report to the [facility] medical arts drive up test site.  If you have labs (blood work) drawn today and your tests are completely normal, you will receive your results only by: Marland Kitchen MyChart Message (if you have MyChart) OR . A paper copy in the mail If you have any lab test that is abnormal or we need to change your treatment, we will call you to review the results.   Testing/Procedures: Your physician has requested that you have a cardiac catheterization. Cardiac catheterization is used to diagnose and/or treat various heart conditions. Doctors may recommend this procedure for a number of different reasons. The most common reason is to evaluate chest pain. Chest pain can be a symptom of coronary artery disease (CAD), and cardiac catheterization can show whether plaque is narrowing or blocking your heart's arteries. This procedure is also used to evaluate the valves, as well as measure the blood flow and oxygen levels in different parts of your heart. For further information please visit [email or web address]. Please follow instruction sheet, as given.     Follow-Up: At [facility], you and your health needs are our priority.  As part of our continuing mission to provide you with exceptional heart care, we have created designated Provider Care Teams.  These Care Teams include your primary Cardiologist (physician) and Advanced Practice Providers (APPs -  Physician Assistants and Nurse Practitioners) who all work together to provide you with the care you need, when you need it.  We recommend signing up for the patient portal  called "MyChart".  Sign up information is provided on this After Visit Summary.  MyChart is used to connect with patients for Virtual Visits (Telemedicine).  Patients are able to view lab/test results, encounter notes, upcoming appointments, etc.  Non-urgent messages can be sent to your provider as well.   To learn more about what you can do with MyChart, go to [email or web address].    Your next appointment:   4 week(s)  The format for your next appointment:   In Person  Provider:    You may see Kathlyn Sacramento, MD or one of the following Advanced Practice Providers on your designated Care Team:    Murray Hodgkins, NP  Christell Faith, PA-C  Marrianne Mood, PA-C    Other Instructions [facility] Cardiac Cath Instructions   You are scheduled for a Cardiac Cath on: 10/18/19 with Dr. Fletcher Anon  Please arrive at 8:30 am on the day of your procedure  Please expect a call from our [place] to pre-register you  Do not eat/drink anything after midnight  Someone will need to drive you home  It is recommended someone be with you for the first 24 hours after your procedure  Wear clothes that are easy to get on/off and wear slip on shoes if possible   Medications bring a current list of all medications with you   _X__ Do not take these medications before your procedure  DO NOT take Metformin, Glipizide, Actos, Hydrochlorothiazide  the morning of your procedure  Metformin should be resumed  48 hours after the procedure.   Day of your procedure: Arrive at  the [place] entrance.  Free valet service is available.  After entering the [place] please check-in at the registration desk (1st desk on your right) to receive your armband. After receiving your armband someone will escort you to the cardiac cath/special procedures waiting area.  The usual length of stay after your procedure is about 2 to 3 hours.  This can vary.  If you have any questions, please call  our office at [phone], or you may call the cardiac cath lab at [facility] directly at [phone]

## 2019-10-12 NOTE — H&P (View-Only) (Signed)
Cardiology Office Note   Date:  10/12/2019   ID:  [name], DOB February 02, 1961, MRN [id number]  PCP:  Cyndi Bender, PA-C  Cardiologist:  Kathlyn Sacramento, MD   Chief Complaint  Patient presents with  . Other    1 week follow up. patient c/o some SOB and chest pain. meds reviewed verbally with patient.       History of Present Illness: [name] is a 59 y.o. male who presents for a follow-up visit regarding coronary artery disease and stable angina. He has known history of uncontrolled diabetes, essential hypertension, hyperlipidemia, obesity, sleep apnea, tobacco use and GERD. He has remote history of non-STEMI status post stenting of the mid right coronary artery.  He presented in July with unstable angina.  He underwent cardiac catheterization which showed patent LAD stent with mild restenosis.  There was 80% stenosis in the distal LAD, 80% stenosis in the ramus branch and moderate left circumflex disease.  The RCA had severe proximal and mid disease with significant in-stent restenosis in the midsegment.  The proximal portion was heavily calcified and tortuous.  There was moderate distal RCA stenosis and 60 to 70% ostial right PDA stenosis.  Dr. Clayborn Bigness attempted RCA PCI but was able to place only a proximal RCA stent.  No other balloons could not be passed in the proximal to mid segment which was left to be treated medically but the patient continued to have angina.  Thus, the patient was referred to me to attempt RCA PCI.  This was done at [facility] on August 11.  The procedure was extremely difficult due to calcifications and tortuosity but I was able to do intravascular lithotripsy and drug eluting stent placement to the proximal right coronary artery.  The presence of the most proximal stent made the procedure extremely difficult especially that it was likely undersized at 2.5 mm.  It was postdilated with 3.25 noncompliant balloon.  In spite of successful PCI, he reports improvement  in angina.  He gets substernal chest discomfort and shortness of breath with exertion radiating to his left arm.  This is almost happening on a daily basis and partially responsive to sublingual nitroglycerin.  He takes his medications regularly.  Unfortunately, he continues to smoke a little less than 1 pack/day.  In addition, his diabetes is still uncontrolled.   Past Medical History:  Diagnosis Date  . Coronary artery disease   . Diabetes mellitus without complication ([place])   . GERD (gastroesophageal reflux disease)   . Hyperlipidemia   . Hypertension   . MI, acute, non ST segment elevation ([place])   . NSTEMI (non-ST elevated myocardial infarction) ([place])   . Sleep apnea   . Tobacco abuse     Past Surgical History:  Procedure Laterality Date  . APPENDECTOMY    . CARDIAC CATHETERIZATION    . COLONOSCOPY    . COLONOSCOPY WITH PROPOFOL N/A 09/16/2017   Procedure: COLONOSCOPY WITH PROPOFOL;  Surgeon: Toledo, Benay Pike, MD;  Location: [facility] ENDOSCOPY;  Service: Gastroenterology;  Laterality: N/A;  (+) DM - oral  . CORONARY STENT INTERVENTION N/A 08/31/2019   Procedure: CORONARY STENT INTERVENTION;  Surgeon: Yolonda Kida, MD;  Location: [place] CV LAB;  Service: Cardiovascular;  Laterality: N/A;  . CORONARY STENT INTERVENTION  09/29/2019  . CORONARY STENT INTERVENTION N/A 09/29/2019   Procedure: CORONARY STENT INTERVENTION;  Surgeon: Wellington Hampshire, MD;  Location: [place] CV LAB;  Service: Cardiovascular;  Laterality: N/A;  . EXPLORATORY  LAPAROTOMY    . EYE SURGERY    . HERNIA REPAIR    . LEFT HEART CATH AND CORONARY ANGIOGRAPHY N/A 08/31/2019   Procedure: LEFT HEART CATH AND CORONARY ANGIOGRAPHY possible PCI and stent;  Surgeon: Yolonda Kida, MD;  Location: [place] CV LAB;  Service: Cardiovascular;  Laterality: N/A;     Current Outpatient Medications  Medication Sig Dispense Refill  . aspirin EC 81 MG tablet Take 81 mg by mouth daily.    Marland Kitchen atorvastatin  (LIPITOR) 80 MG tablet Take 80 mg by mouth daily.    . carvedilol (COREG) 3.125 MG tablet Take 1 tablet (3.125 mg total) by mouth 2 (two) times daily with a meal. 180 tablet 3  . Cinnamon 500 MG capsule Take 1,000 mg by mouth daily.    . clopidogrel (PLAVIX) 75 MG tablet Take 75 mg by mouth daily.    Marland Kitchen EPINEPHrine 0.3 mg/0.3 mL IJ SOAJ injection Inject 0.3 mg into the muscle as needed for anaphylaxis.    . famotidine (PEPCID) 20 MG tablet Take 20 mg by mouth daily.    Marland Kitchen glipiZIDE (GLUCOTROL) 10 MG tablet Take 2 tablets (20 mg total) by mouth 2 (two) times daily before a meal. (Patient taking differently: Take 10 mg by mouth 2 (two) times daily before a meal. ) 120 tablet 0  . hydrochlorothiazide (HYDRODIURIL) 25 MG tablet Take 1 tablet (25 mg total) by mouth daily.    . isosorbide mononitrate (IMDUR) 60 MG 24 hr tablet Take 1.5 tablets (90 mg total) by mouth daily. 45 tablet 5  . metFORMIN (GLUCOPHAGE) 1000 MG tablet Take 1 tablet (1,000 mg total) by mouth 2 (two) times daily with a meal. Restart on 10/02/19.    Marland Kitchen nitroGLYCERIN (NITROSTAT) 0.4 MG SL tablet Place 0.4 mg under the tongue every 5 (five) minutes as needed for chest pain.     . pioglitazone (ACTOS) 45 MG tablet Take 45 mg by mouth daily.    . vitamin B-12 (CYANOCOBALAMIN) 1000 MCG tablet Take 1,000 mcg by mouth daily.     No current facility-administered medications for this visit.    Allergies:   Shellfish allergy, Penicillins, and Erythromycin    Social History:  The patient  reports that he has been smoking cigarettes. He has been smoking about 1.00 pack per day. He has never used smokeless tobacco. He reports that he does not drink alcohol and does not use drugs.   Family History:  The patient's family history includes Diabetes in his father; Heart attack in his father; Heart disease in his father; Hypertension in his father.    ROS:  Please see the history of present illness.   Otherwise, review of systems are positive for  none.   All other systems are reviewed and negative.    PHYSICAL EXAM: VS:  BP 114/74 (BP Location: Left Arm, Patient Position: Sitting, Cuff Size: Normal)   Pulse 86   Ht 5\' 6"  (1.676 m)   Wt 213 lb (96.6 kg)   SpO2 93%   BMI 34.38 kg/m  , BMI Body mass index is 34.38 kg/m. GEN: Well nourished, well developed, in no acute distress  HEENT: normal  Neck: no JVD, carotid bruits, or masses Cardiac: RRR; no murmurs, rubs, or gallops,no edema  Respiratory:  clear to auscultation bilaterally, normal work of breathing GI: soft, nontender, nondistended, + BS MS: no deformity or atrophy  Skin: warm and dry, no rash Neuro:  Strength and sensation are intact Psych: euthymic mood,  full affect   EKG:  EKG is ordered today. The ekg ordered today demonstrates normal sinus rhythm with left axis deviation.  No significant ST or T wave changes.   Recent Labs: 09/30/2019: BUN 19; Creatinine, Ser 1.49; Hemoglobin 12.1; Platelets 216; Potassium 4.5; Sodium 137    Lipid Panel    Component Value Date/Time   CHOL 227 (H) 09/01/2019 0450   TRIG 755 (H) 09/01/2019 0450   HDL 34 (L) 09/01/2019 0450   CHOLHDL 6.7 09/01/2019 0450   VLDL UNABLE TO CALCULATE IF TRIGLYCERIDE OVER 400 mg/dL 09/01/2019 0450   LDLCALC UNABLE TO CALCULATE IF TRIGLYCERIDE OVER 400 mg/dL 09/01/2019 0450   LDLDIRECT 71.5 09/01/2019 0450      Wt Readings from Last 3 Encounters:  10/12/19 213 lb (96.6 kg)  10/08/19 212 lb 12.8 oz (96.5 kg)  09/30/19 212 lb 6.4 oz (96.3 kg)      No flowsheet data found.    ASSESSMENT AND PLAN:  1.  Coronary artery disease involving native coronary arteries with stable angina: The patient's symptoms did not improve after recent complex RCA PCI.  His current symptoms are suggestive of class III angina in spite of maximal antianginal medication.  Differential diagnosis include an acute complication with the placed stents recently or possible angina related to small vessel disease as he  has diffuse diabetic vessels. Given his symptoms, I recommend relook coronary angiography and possible PCI.  I discussed the procedure in details as well as risk and benefits.  Patient and family had a lot of questions about management options including CABG.  All of this will be determined after his cardiac catheterization. I also discussed with him my concerns about the lack of risk factor control on him including continued tobacco use and uncontrolled diabetes.  2.  Mixed hyperlipidemia: Most recent lipid profile showed triglyceride above 700.  His LDL was 71.  Continue atorvastatin.  Elevated triglyceride is likely due to uncontrolled diabetes.  Vascepa can be considered.  3.  Essential hypertension: Blood pressures controlled on current medications  4.  Tobacco use: I discussed with him the importance of smoking cessation.  5.  Disability determination: The patient works as a Administrator.  I explained to him that with continued angina, he likely cannot continue working as a Administrator.  The patient is interested in applying for disability but that will be determined based on the results of cardiac catheterization.    Disposition:   FU with me in 1 month  Signed,  Kathlyn Sacramento, MD  10/12/2019 1:30 PM    [place]

## 2019-10-12 NOTE — Telephone Encounter (Addendum)
Called the patient to provide an update. lmom. Patient 10/18/19 cardiac is now scheduled for 10:30 am with a 9:30 am [facility].  Patient will need lab (bmet, cbc) on the same day 10/14/19 that he is scheduled for his COVID test. Labs are to be drawn at the medical mall.

## 2019-10-13 NOTE — Telephone Encounter (Signed)
Spoke with the patient and confirmed that he received the message below. Patient verbalized understanding.

## 2019-10-14 ENCOUNTER — Other Ambulatory Visit
Admission: RE | Admit: 2019-10-14 | Discharge: 2019-10-14 | Disposition: A | Discharge status: Home / Self Care | Payer: 59 | Source: Ambulatory Visit | Attending: Cardiovascular Disease | Admitting: Cardiovascular Disease

## 2019-10-14 ENCOUNTER — Other Ambulatory Visit: Payer: Self-pay

## 2019-10-14 ENCOUNTER — Other Ambulatory Visit
Admission: RE | Admit: 2019-10-14 | Discharge: 2019-10-14 | Disposition: A | Discharge status: Home / Self Care | Payer: 59 | Source: Home / Self Care | Attending: Cardiovascular Disease | Admitting: Cardiovascular Disease

## 2019-10-14 DIAGNOSIS — I209 Angina pectoris, unspecified: Secondary | ICD-10-CM

## 2019-10-14 DIAGNOSIS — I25118 Atherosclerotic heart disease of native coronary artery with other forms of angina pectoris: Secondary | ICD-10-CM

## 2019-10-14 DIAGNOSIS — Z01812 Encounter for preprocedural laboratory examination: Secondary | ICD-10-CM | POA: Insufficient documentation

## 2019-10-14 DIAGNOSIS — Z20822 Contact with and (suspected) exposure to covid-19: Secondary | ICD-10-CM | POA: Diagnosis not present

## 2019-10-14 LAB — CBC WITH DIFFERENTIAL/PLATELET
Abs Immature Granulocytes: 0.1 10*3/uL — ABNORMAL HIGH (ref 0.00–0.07)
Basophils Absolute: 0.1 10*3/uL (ref 0.0–0.1)
Basophils Relative: 1 %
Eosinophils Absolute: 0.3 10*3/uL (ref 0.0–0.5)
Eosinophils Relative: 3 %
HCT: 38.4 % — ABNORMAL LOW (ref 39.0–52.0)
Hemoglobin: 13 g/dL (ref 13.0–17.0)
Immature Granulocytes: 1 %
Lymphocytes Relative: 34 %
Lymphs Abs: 3 10*3/uL (ref 0.7–4.0)
MCH: 31.6 pg (ref 26.0–34.0)
MCHC: 33.9 g/dL (ref 30.0–36.0)
MCV: 93.2 fL (ref 80.0–100.0)
Monocytes Absolute: 0.7 10*3/uL (ref 0.1–1.0)
Monocytes Relative: 8 %
Neutro Abs: 4.7 10*3/uL (ref 1.7–7.7)
Neutrophils Relative %: 53 %
Platelets: 257 10*3/uL (ref 150–400)
RBC: 4.12 MIL/uL — ABNORMAL LOW (ref 4.22–5.81)
RDW: 13.3 % (ref 11.5–15.5)
WBC: 8.8 10*3/uL (ref 4.0–10.5)
nRBC: 0 % (ref 0.0–0.2)

## 2019-10-14 LAB — BASIC METABOLIC PANEL
Anion gap: 12 (ref 5–15)
BUN: 22 mg/dL — ABNORMAL HIGH (ref 6–20)
CO2: 24 mmol/L (ref 22–32)
Calcium: 9.7 mg/dL (ref 8.9–10.3)
Chloride: 99 mmol/L (ref 98–111)
Creatinine, Ser: 1.44 mg/dL — ABNORMAL HIGH (ref 0.61–1.24)
GFR calc Af Amer: >60 mL/min (ref >60)
GFR calc non Af Amer: 52 mL/min — ABNORMAL LOW (ref >60)
Glucose, Bld: 160 mg/dL — ABNORMAL HIGH (ref 70–99)
Potassium: 4.2 mmol/L (ref 3.5–5.1)
Sodium: 135 mmol/L (ref 135–145)

## 2019-10-14 LAB — SARS CORONAVIRUS 2 (TAT 6-24 HRS): SARS Coronavirus 2: NEGATIVE

## 2019-10-18 ENCOUNTER — Ambulatory Visit
Admission: RE | Admit: 2019-10-18 | Discharge: 2019-10-18 | Disposition: A | Discharge status: Home / Self Care | Payer: 59 | Attending: Cardiovascular Disease | Admitting: Cardiovascular Disease

## 2019-10-18 ENCOUNTER — Telehealth: Payer: Self-pay

## 2019-10-18 ENCOUNTER — Encounter: Payer: Self-pay | Admitting: Cardiovascular Disease

## 2019-10-18 ENCOUNTER — Encounter
Admission: RE | Disposition: A | Discharge status: Home / Self Care | Payer: 59 | Source: Home / Self Care | Attending: Cardiovascular Disease

## 2019-10-18 ENCOUNTER — Other Ambulatory Visit: Payer: Self-pay

## 2019-10-18 DIAGNOSIS — I10 Essential (primary) hypertension: Secondary | ICD-10-CM | POA: Insufficient documentation

## 2019-10-18 DIAGNOSIS — F17210 Nicotine dependence, cigarettes, uncomplicated: Secondary | ICD-10-CM | POA: Diagnosis not present

## 2019-10-18 DIAGNOSIS — I208 Other forms of angina pectoris: Secondary | ICD-10-CM

## 2019-10-18 DIAGNOSIS — Z6834 Body mass index (BMI) 34.0-34.9, adult: Secondary | ICD-10-CM | POA: Diagnosis not present

## 2019-10-18 DIAGNOSIS — Z955 Presence of coronary angioplasty implant and graft: Secondary | ICD-10-CM

## 2019-10-18 DIAGNOSIS — I209 Angina pectoris, unspecified: Secondary | ICD-10-CM

## 2019-10-18 DIAGNOSIS — Z7984 Long term (current) use of oral hypoglycemic drugs: Secondary | ICD-10-CM | POA: Diagnosis not present

## 2019-10-18 DIAGNOSIS — E782 Mixed hyperlipidemia: Secondary | ICD-10-CM | POA: Diagnosis not present

## 2019-10-18 DIAGNOSIS — Z7902 Long term (current) use of antithrombotics/antiplatelets: Secondary | ICD-10-CM | POA: Diagnosis not present

## 2019-10-18 DIAGNOSIS — Z880 Allergy status to penicillin: Secondary | ICD-10-CM | POA: Diagnosis not present

## 2019-10-18 DIAGNOSIS — E669 Obesity, unspecified: Secondary | ICD-10-CM | POA: Diagnosis not present

## 2019-10-18 DIAGNOSIS — G4730 Sleep apnea, unspecified: Secondary | ICD-10-CM | POA: Insufficient documentation

## 2019-10-18 DIAGNOSIS — Z79899 Other long term (current) drug therapy: Secondary | ICD-10-CM | POA: Insufficient documentation

## 2019-10-18 DIAGNOSIS — I252 Old myocardial infarction: Secondary | ICD-10-CM | POA: Insufficient documentation

## 2019-10-18 DIAGNOSIS — K219 Gastro-esophageal reflux disease without esophagitis: Secondary | ICD-10-CM | POA: Diagnosis not present

## 2019-10-18 DIAGNOSIS — I25118 Atherosclerotic heart disease of native coronary artery with other forms of angina pectoris: Secondary | ICD-10-CM | POA: Insufficient documentation

## 2019-10-18 DIAGNOSIS — I25119 Atherosclerotic heart disease of native coronary artery with unspecified angina pectoris: Secondary | ICD-10-CM | POA: Diagnosis not present

## 2019-10-18 DIAGNOSIS — E119 Type 2 diabetes mellitus without complications: Secondary | ICD-10-CM | POA: Diagnosis not present

## 2019-10-18 DIAGNOSIS — Z881 Allergy status to other antibiotic agents status: Secondary | ICD-10-CM | POA: Insufficient documentation

## 2019-10-18 DIAGNOSIS — E1165 Type 2 diabetes mellitus with hyperglycemia: Secondary | ICD-10-CM

## 2019-10-18 DIAGNOSIS — Z7982 Long term (current) use of aspirin: Secondary | ICD-10-CM | POA: Diagnosis not present

## 2019-10-18 HISTORY — PX: LEFT HEART CATH AND CORONARY ANGIOGRAPHY: CATH118249

## 2019-10-18 LAB — GLUCOSE, CAPILLARY: Glucose-Capillary: 122 mg/dL — ABNORMAL HIGH (ref 70–99)

## 2019-10-18 SURGERY — LEFT HEART CATH AND CORONARY ANGIOGRAPHY
Anesthesia: Moderate Sedation | Laterality: Left

## 2019-10-18 MED ORDER — VERAPAMIL HCL 2.5 MG/ML IV SOLN
INTRAVENOUS | Status: DC | PRN
  Administered 2019-10-18: 2.5 mg via INTRA_ARTERIAL

## 2019-10-18 MED ORDER — SODIUM CHLORIDE 0.9% FLUSH: 3.0000 mL | INTRAVENOUS | Status: DC | PRN

## 2019-10-18 MED ORDER — HEPARIN SODIUM (PORCINE) 1000 UNIT/ML IJ SOLN
INTRAMUSCULAR | Status: AC
  Filled 2019-10-18: qty 1

## 2019-10-18 MED ORDER — SODIUM CHLORIDE 0.9 % IV SOLN: 250.0000 mL | INTRAVENOUS | Status: DC | PRN

## 2019-10-18 MED ORDER — FENTANYL CITRATE (PF) 100 MCG/2ML IJ SOLN
INTRAMUSCULAR | Status: DC | PRN
  Administered 2019-10-18: 25 ug via INTRAVENOUS

## 2019-10-18 MED ORDER — VERAPAMIL HCL 2.5 MG/ML IV SOLN
INTRAVENOUS | Status: AC
  Filled 2019-10-18: qty 2

## 2019-10-18 MED ORDER — SODIUM CHLORIDE 0.9% FLUSH: 3.0000 mL | Freq: Two times a day (BID) | INTRAVENOUS | Status: DC

## 2019-10-18 MED ORDER — HEPARIN (PORCINE) IN NACL 1000-0.9 UT/500ML-% IV SOLN
INTRAVENOUS | Status: AC
  Filled 2019-10-18: qty 1000

## 2019-10-18 MED ORDER — SODIUM CHLORIDE 0.9 % IV SOLN: INTRAVENOUS | Status: DC

## 2019-10-18 MED ORDER — MIDAZOLAM HCL 2 MG/2ML IJ SOLN
INTRAMUSCULAR | Status: DC | PRN
  Administered 2019-10-18: 1 mg via INTRAVENOUS

## 2019-10-18 MED ORDER — HEPARIN (PORCINE) IN NACL 1000-0.9 UT/500ML-% IV SOLN
INTRAVENOUS | Status: DC | PRN
  Administered 2019-10-18: 500 mL

## 2019-10-18 MED ORDER — IOHEXOL 300 MG/ML  SOLN
INTRAMUSCULAR | Status: DC | PRN
  Administered 2019-10-18: 70 mL

## 2019-10-18 MED ORDER — ASPIRIN 81 MG PO CHEW: 81.0000 mg | CHEWABLE_TABLET | ORAL | Status: DC

## 2019-10-18 MED ORDER — MIDAZOLAM HCL 2 MG/2ML IJ SOLN
INTRAMUSCULAR | Status: AC
  Filled 2019-10-18: qty 2

## 2019-10-18 MED ORDER — FENTANYL CITRATE (PF) 100 MCG/2ML IJ SOLN
INTRAMUSCULAR | Status: AC
  Filled 2019-10-18: qty 2

## 2019-10-18 SURGICAL SUPPLY — 7 items
CATH INFINITI 5FR JK (CATHETERS) ×2 IMPLANT
DEVICE RAD TR BAND REGULAR (VASCULAR PRODUCTS) ×2 IMPLANT
GLIDESHEATH SLEND SS 6F .021 (SHEATH) ×2 IMPLANT
GUIDEWIRE INQWIRE 1.5J.035X260 (WIRE) ×1 IMPLANT
INQWIRE 1.5J .035X260CM (WIRE) ×2
KIT MANI 3VAL PERCEP (MISCELLANEOUS) ×2 IMPLANT
PACK CARDIAC CATH (CUSTOM PROCEDURE TRAY) ×2 IMPLANT

## 2019-10-18 NOTE — Interval H&P Note (Signed)
**Note Jeff-Identified via Obfuscation** Cath Lab Visit (complete for each Cath Lab visit)  Clinical Evaluation Leading to the Procedure:   ACS: No.  Non-ACS:    Anginal Classification: CCS III  Anti-ischemic medical therapy: Maximal Therapy (2 or more classes of medications)  Non-Invasive Test Results: No non-invasive testing performed  Prior CABG: No previous CABG      History and Physical Interval Note:  10/18/2019 10:52 AM  [name]  has presented today for surgery, with the diagnosis of LT Cath   Stable angina   CAD.  The various methods of treatment have been discussed with the patient and family. After consideration of risks, benefits and other options for treatment, the patient has consented to  Procedure(s): LEFT HEART CATH AND CORONARY ANGIOGRAPHY (Left) as a surgical intervention.  The patient's history has been reviewed, patient examined, no change in status, stable for surgery.  I have reviewed the patient's chart and labs.  Questions were answered to the patient's satisfaction.     [name]

## 2019-10-18 NOTE — Progress Notes (Signed)
Patient clinically stable post Heart cath per Dr Fletcher Anon. Tolerated well. TR band intact to right radial, no bleeding nor hematoma at site. Fiance at bedside with discharge instructions given and questions answered. Dr Fletcher Anon in to speak with patient has return appointment already in 4 weeks to see Dr Fletcher Anon who is wanting patient to start cardiac rehab. Denies complaints at this time.

## 2019-10-18 NOTE — Telephone Encounter (Signed)
Referrals placed to:  [facility] cardiac rehab. [facility].

## 2019-10-18 NOTE — Telephone Encounter (Signed)
-----   Message from Wellington Hampshire, MD sent at 10/18/2019 12:38 PM EDT ----- Please refer him to cardiac rehab (diagnosis is non-STEMI and coronary stents) Also referred to endocrinology for management of uncontrolled diabetes.

## 2019-10-18 NOTE — Discharge Instructions (Signed)
 Radial Site Care  This sheet gives you information about how to care for yourself after your procedure. Your health care provider may also give you more specific instructions. If you have problems or questions, contact your health care provider. What can I expect after the procedure? After the procedure, it is common to have:  Bruising and tenderness at the catheter insertion area. Follow these instructions at home: Medicines  Take over-the-counter and prescription medicines only as told by your health care provider. Insertion site care  Follow instructions from your health care provider about how to take care of your insertion site. Make sure you: ? Wash your hands with soap and water before you change your bandage (dressing). If soap and water are not available, use hand sanitizer. ? Change your dressing as told by your health care provider. ? Leave stitches (sutures), skin glue, or adhesive strips in place. These skin closures may need to stay in place for 2 weeks or longer. If adhesive strip edges start to loosen and curl up, you may trim the loose edges. Do not remove adhesive strips completely unless your health care provider tells you to do that.  Check your insertion site every day for signs of infection. Check for: ? Redness, swelling, or pain. ? Fluid or blood. ? Pus or a bad smell. ? Warmth.  Do not take baths, swim, or use a hot tub until your health care provider approves.  You may shower 24-48 hours after the procedure, or as directed by your health care provider. ? Remove the dressing and gently wash the site with plain soap and water. ? Pat the area dry with a clean towel. ? Do not rub the site. That could cause bleeding.  Do not apply powder or lotion to the site. Activity   For 24 hours after the procedure, or as directed by your health care provider: ? Do not flex or bend the affected arm. ? Do not push or pull heavy objects with the affected arm. ? Do not  drive yourself home from the hospital or clinic. You may drive 24 hours after the procedure unless your health care provider tells you not to. ? Do not operate machinery or power tools.  Do not lift anything that is heavier than 10 lb (4.5 kg), or the limit that you are told, until your health care provider says that it is safe.  Ask your health care provider when it is okay to: ? Return to work or school. ? Resume usual physical activities or sports. ? Resume sexual activity. General instructions  If the catheter site starts to bleed, raise your arm and put firm pressure on the site. If the bleeding does not stop, get help right away. This is a medical emergency.  If you went home on the same day as your procedure, a responsible adult should be with you for the first 24 hours after you arrive home.  Keep all follow-up visits as told by your health care provider. This is important. Contact a health care provider if:  You have a fever.  You have redness, swelling, or yellow drainage around your insertion site. Get help right away if:  You have unusual pain at the radial site.  The catheter insertion area swells very fast.  The insertion area is bleeding, and the bleeding does not stop when you hold steady pressure on the area.  Your arm or hand becomes pale, cool, tingly, or numb. These symptoms may represent a serious   problem that is an emergency. Do not wait to see if the symptoms will go away. Get medical help right away. Call your local emergency services (911 in the [place] Do not drive yourself to the hospital. Summary  After the procedure, it is common to have bruising and tenderness at the site.  Follow instructions from your health care provider about how to take care of your radial site wound. Check the wound every day for signs of infection.  Do not lift anything that is heavier than 10 lb (4.5 kg), or the limit that you are told, until your health care provider says  that it is safe. This information is not intended to replace advice given to you by your health care provider. Make sure you discuss any questions you have with your health care provider. Document Revised: 03/12/2017 Document Reviewed: 03/12/2017 Elsevier Patient Education  2020 Elsevier Inc.        Moderate Conscious Sedation, Adult, Care After These instructions provide you with information about caring for yourself after your procedure. Your health care provider may also give you more specific instructions. Your treatment has been planned according to current medical practices, but problems sometimes occur. Call your health care provider if you have any problems or questions after your procedure. What can I expect after the procedure? After your procedure, it is common:  To feel sleepy for several hours.  To feel clumsy and have poor balance for several hours.  To have poor judgment for several hours.  To vomit if you eat too soon. Follow these instructions at home: For at least 24 hours after the procedure:   Do not: ? Participate in activities where you could fall or become injured. ? Drive. ? Use heavy machinery. ? Drink alcohol. ? Take sleeping pills or medicines that cause drowsiness. ? Make important decisions or sign legal documents. ? Take care of children on your own.  Rest. Eating and drinking  Follow the diet recommended by your health care provider.  If you vomit: ? Drink water, juice, or soup when you can drink without vomiting. ? Make sure you have little or no nausea before eating solid foods. General instructions  Have a responsible adult stay with you until you are awake and alert.  Take over-the-counter and prescription medicines only as told by your health care provider.  If you smoke, do not smoke without supervision.  Keep all follow-up visits as told by your health care provider. This is important. Contact a health care provider  if:  You keep feeling nauseous or you keep vomiting.  You feel light-headed.  You develop a rash.  You have a fever. Get help right away if:  You have trouble breathing. This information is not intended to replace advice given to you by your health care provider. Make sure you discuss any questions you have with your health care provider. Document Revised: 01/17/2017 Document Reviewed: 05/27/2015 Elsevier Patient Education  2020 Elsevier Inc.  

## 2019-10-19 ENCOUNTER — Other Ambulatory Visit: Payer: Self-pay | Admitting: *Deleted

## 2019-10-19 DIAGNOSIS — Z955 Presence of coronary angioplasty implant and graft: Secondary | ICD-10-CM

## 2019-10-21 ENCOUNTER — Telehealth: Payer: Self-pay | Admitting: Cardiovascular Disease

## 2019-10-21 NOTE — Telephone Encounter (Signed)
Spoke with [name] at [employer]. They are asking for a copy of the patients recent cardiovascular testing (cardiac cath) The patients return to work date and the patients next appt with our office.  Results should be faxed to [phone] and include the claim # [id number]. [name] that the patients return to work date is TBD and his next appt is scheduled for 11/10/19.

## 2019-10-21 NOTE — Telephone Encounter (Signed)
Lmov for patient.  We have an email on file but patient does not have mychart.  Will attempt to obtain release at call back .

## 2019-10-21 NOTE — Telephone Encounter (Signed)
[employer] calling in regards to disability forms States forms claim patient can return to work pending on stress test results Would like to clarify results and see if claim has been extended Please call [phone] claim [phone]

## 2019-10-27 NOTE — Telephone Encounter (Signed)
Spoke with patient . Sent via email.  Patient advised to return form to office via mychart mail fax or drop off.  Patient verbalized understanding and will call back if he needs assistance to complete form.    Will note when release received to assist with request below.

## 2019-10-27 NOTE — Telephone Encounter (Signed)
Patient signed release and dropped off [employer] disability forms  Placed in nurse box

## 2019-10-27 NOTE — Telephone Encounter (Signed)
Placed on Dr. Tyrell Antonio desk. Will await completion.

## 2019-10-27 NOTE — Telephone Encounter (Signed)
Patient will be unable to print at home  Will come by office to pick up and sign

## 2019-10-27 NOTE — Telephone Encounter (Signed)
Patient calling in to discuss short term disability forms  Please advise

## 2019-10-28 NOTE — Telephone Encounter (Signed)
[employer] calling in to discuss results of stress test, next appointment and return to work date  Please return call at [phone]

## 2019-10-28 NOTE — Telephone Encounter (Signed)
Patient calling to check status of form completion .    He also wants to make note that there should be 2 separate forms .   [employer] should be returned via fax   The other form is from a [place] and this should be returned to the patient .  Please call when ready for pick up

## 2019-10-29 NOTE — Telephone Encounter (Addendum)
Returned the call to [employer]. Held the line for >10 mins and was number [phone] in their que. Was not able to hold any longer. Chose the option to leave a message for customer service,i ncluded the claim number, patients next appt with our office 9/22. Return to work date, not known.   Faxed  copies of the patients recent 8/11 pci and 8/30 cardiac cath.

## 2019-11-01 NOTE — Telephone Encounter (Signed)
Please call with status of forms completion.

## 2019-11-02 NOTE — Telephone Encounter (Signed)
Patient calling back to speak with [name] when able  [phone] is the best number

## 2019-11-03 ENCOUNTER — Ambulatory Visit (INDEPENDENT_AMBULATORY_CARE_PROVIDER_SITE_OTHER): Payer: 59 | Admitting: Internal Medicine

## 2019-11-03 ENCOUNTER — Encounter: Payer: Self-pay | Admitting: Internal Medicine

## 2019-11-03 ENCOUNTER — Other Ambulatory Visit: Payer: Self-pay

## 2019-11-03 VITALS — BP 110/72 | HR 82 | Ht 66.0 in | Wt 213.8 lb

## 2019-11-03 DIAGNOSIS — N1831 Chronic kidney disease, stage 3a: Secondary | ICD-10-CM

## 2019-11-03 DIAGNOSIS — E119 Type 2 diabetes mellitus without complications: Secondary | ICD-10-CM | POA: Insufficient documentation

## 2019-11-03 DIAGNOSIS — E782 Mixed hyperlipidemia: Secondary | ICD-10-CM | POA: Diagnosis not present

## 2019-11-03 DIAGNOSIS — E1121 Type 2 diabetes mellitus with diabetic nephropathy: Secondary | ICD-10-CM

## 2019-11-03 DIAGNOSIS — E1165 Type 2 diabetes mellitus with hyperglycemia: Secondary | ICD-10-CM | POA: Diagnosis not present

## 2019-11-03 DIAGNOSIS — E1122 Type 2 diabetes mellitus with diabetic chronic kidney disease: Secondary | ICD-10-CM

## 2019-11-03 DIAGNOSIS — E1159 Type 2 diabetes mellitus with other circulatory complications: Secondary | ICD-10-CM

## 2019-11-03 LAB — POCT GLUCOSE (DEVICE FOR HOME USE): Glucose Fasting, POC: 123 mg/dL — AB (ref 70–99)

## 2019-11-03 MED ORDER — METFORMIN HCL 1000 MG PO TABS
1000.0000 mg | ORAL_TABLET | Freq: Two times a day (BID) | ORAL | 3 refills | Status: DC
Start: 2019-11-03 — End: 2020-04-05

## 2019-11-03 MED ORDER — GLIPIZIDE 10 MG PO TABS: 15.0000 mg | ORAL_TABLET | Freq: Two times a day (BID) | ORAL | 1 refills | Status: DC

## 2019-11-03 MED ORDER — PIOGLITAZONE HCL 45 MG PO TABS
45.0000 mg | ORAL_TABLET | Freq: Every day | ORAL | 1 refills | Status: DC
Start: 2019-11-03 — End: 2020-04-04

## 2019-11-03 NOTE — Telephone Encounter (Signed)
Patient calling to check on status  States the insurance company has not received fax - please refax and call to discuss

## 2019-11-03 NOTE — Telephone Encounter (Signed)
Spoke with the patient. Adv him that the disability form has been completed by Dr. Fletcher Anon and is ready to be picked up. Patient will stop by the office to pick it up. I have re-faxed the previous requested information.  Faxed to both numbers [phone] and [phone].  Patient verbalized understanding and voiced appreciation for the assistance.

## 2019-11-03 NOTE — Patient Instructions (Addendum)
-   Increase Glipizide 10 mg to one and a half tablet before Breakfast and before supper - Continue Metformin 1000 mg twice daily  - Continue Pioglitazone 45 mg daily    Choose healthy, lower carb lower calorie snacks: toss salad, vegetables, cottage cheese, peanut butter, low fat cheese / string cheese, lower sodium deli meat, tuna salad or chicken salad    HOW TO TREAT LOW BLOOD SUGARS (Blood sugar LESS THAN 70 MG/DL)  Please follow the RULE OF 15 for the treatment of hypoglycemia treatment (when your (blood sugars are less than 70 mg/dL)    STEP 1: Take 15 grams of carbohydrates when your blood sugar is low, which includes:   3-4 GLUCOSE TABS  OR  3-4 OZ OF JUICE OR REGULAR SODA OR  ONE TUBE OF GLUCOSE GEL     STEP 2: RECHECK blood sugar in 15 MINUTES STEP 3: If your blood sugar is still low at the 15 minute recheck --> then, go back to STEP 1 and treat AGAIN with another 15 grams of carbohydrates.

## 2019-11-03 NOTE — Telephone Encounter (Signed)
Late entry. Spoke with the patient on 11/02/19.  Adv the patient that the info requested by [employer] -cath reports -upcoming appt with our office -return to work (no date)  Was faxed to them on 10/29/19 with confirmation received.  Adv him that Dr. Fletcher Anon is working on completing the forms that he dropped off. Apologized for the delay. Adv the patient that I will call him once they are completed.  Patient verbalized understanding and voiced appreciation for the assistance.

## 2019-11-03 NOTE — Progress Notes (Signed)
Name: [name]  MRN/ DOB: [id number], Jun 19, 1960   Age/ Sex: 59 y.o., male    PCP: Cyndi Bender, PA-C   Reason for Endocrinology Evaluation: Type 2 Diabetes Mellitus     Date of Initial Endocrinology Visit: 11/03/2019     PATIENT IDENTIFIER: [name] is a 59 y.o. male with a past medical history of T2DM, CAD, OSA and HTN. The patient presented for initial endocrinology clinic visit on 11/03/2019 for consultative assistance with his diabetes management.    HPI: [name] is accompanied by his wife [name]   Diagnosed with DM years ago  Prior Medications tried/Intolerance: Trulicity- nausea, persistent hyperglycemia  Currently checking blood sugars 0x / day  Hypoglycemia episodes : no              Hemoglobin A1c 12.1% in 08/2019 Patient required assistance for hypoglycemia: no Patient has required hospitalization within the last 1 year from hyper or hypoglycemia: no  In terms of diet, the patient eats 3 meals a day, does not snack much. Drinks occasional sweet tea    No personal hx of pancreatitis   HOME DIABETES REGIMEN: Glipizide 10 mg BID ( before breakfast and bedtime) Metformin 1000 mg BID Pioglitazone 45 mg daily     Statin: yes ACE-I/ARB: no Prior Diabetic Education: no    METER DOWNLOAD SUMMARY: Did not bring   DIABETIC COMPLICATIONS: Microvascular complications:   Denies: CKD, retinopathy, neuropathy   Last eye exam: Completed 01/2019  Macrovascular complications:   CAD ( S/P PCI)  Denies: PVD, CVA   PAST HISTORY: Past Medical History:  Past Medical History:  Diagnosis Date  . Coronary artery disease   . Diabetes mellitus without complication ([place])   . GERD (gastroesophageal reflux disease)   . Hyperlipidemia   . Hypertension   . MI, acute, non ST segment elevation ([place])   . NSTEMI (non-ST elevated myocardial infarction) ([place])   . Sleep apnea   . Tobacco abuse    Past Surgical History:  Past Surgical History:  Procedure  Laterality Date  . APPENDECTOMY    . CARDIAC CATHETERIZATION    . COLONOSCOPY    . COLONOSCOPY WITH PROPOFOL N/A 09/16/2017   Procedure: COLONOSCOPY WITH PROPOFOL;  Surgeon: Toledo, Benay Pike, MD;  Location: [facility] END[facility];  Service: Gastroenterology;  Laterality: N/A;  (+) DM - oral  . CORONARY STENT INTERVENTION N/A 08/31/2019   Procedure: CORONARY STENT INTERVENTION;  Surgeon: Yolonda Kida, MD;  Location: [facility];  Service: Cardiovascular;  Laterality: N/A;  . CORONARY STENT INTERVENTION  09/29/2019  . CORONARY STENT INTERVENTION N/A 09/29/2019   Procedure: CORONARY STENT INTERVENTION;  Surgeon: Wellington Hampshire, MD;  Location: [facility];  Service: Cardiovascular;  Laterality: N/A;  . EXPLORATORY LAPAROTOMY    . EYE SURGERY    . HERNIA REPAIR    . LEFT HEART CATH AND CORONARY ANGIOGRAPHY N/A 08/31/2019   Procedure: LEFT HEART CATH AND CORONARY ANGIOGRAPHY possible PCI and stent;  Surgeon: Yolonda Kida, MD;  Location: [facility];  Service: Cardiovascular;  Laterality: N/A;  . LEFT HEART CATH AND CORONARY ANGIOGRAPHY Left 10/18/2019   Procedure: LEFT HEART CATH AND CORONARY ANGIOGRAPHY;  Surgeon: Wellington Hampshire, MD;  Location: [facility];  Service: Cardiovascular;  Laterality: Left;      Social History:  reports that he has been smoking cigarettes. He has been smoking about 1.00 pack per day. He has never used smokeless tobacco. He reports  that he does not drink alcohol and does not use drugs. Family History:  Family History  Problem Relation Age of Onset  . Heart disease Father   . Heart attack Father   . Hypertension Father   . Diabetes Father      HOME MEDICATIONS: Allergies as of 11/03/2019      Reactions   Shellfish Allergy Anaphylaxis   Penicillins Swelling   Childhood   Erythromycin Rash, Other (See Comments)   Unknown      Medication List       Accurate as of November 03, 2019  9:53 AM. If you have any  questions, ask your nurse or doctor.        aspirin EC 81 MG tablet Take 81 mg by mouth daily.   atorvastatin 80 MG tablet Commonly known as: LIPITOR Take 80 mg by mouth daily.   carvedilol 3.125 MG tablet Commonly known as: COREG Take 1 tablet (3.125 mg total) by mouth 2 (two) times daily with a meal.   Cinnamon 500 MG capsule Take 1,000 mg by mouth daily.   clopidogrel 75 MG tablet Commonly known as: PLAVIX Take 75 mg by mouth daily.   EPINEPHrine 0.3 mg/0.3 mL Soaj injection Commonly known as: EPI-PEN Inject 0.3 mg into the muscle as needed for anaphylaxis.   famotidine 20 MG tablet Commonly known as: PEPCID Take 20 mg by mouth daily.   glipiZIDE 10 MG tablet Commonly known as: GLUCOTROL Take 2 tablets (20 mg total) by mouth 2 (two) times daily before a meal. What changed: how much to take   hydrochlorothiazide 25 MG tablet Commonly known as: HYDRODIURIL Take 1 tablet (25 mg total) by mouth daily.   isosorbide mononitrate 60 MG 24 hr tablet Commonly known as: IMDUR Take 1.5 tablets (90 mg total) by mouth daily.   metFORMIN 1000 MG tablet Commonly known as: GLUCOPHAGE Take 1 tablet (1,000 mg total) by mouth 2 (two) times daily with a meal. Restart on 10/02/19. What changed: additional instructions   nitroGLYCERIN 0.4 MG SL tablet Commonly known as: NITROSTAT Place 0.4 mg under the tongue every 5 (five) minutes as needed for chest pain.   pioglitazone 45 MG tablet Commonly known as: ACTOS Take 45 mg by mouth daily.   vitamin B-12 1000 MCG tablet Commonly known as: CYANOCOBALAMIN Take 1,000 mcg by mouth daily.        ALLERGIES: Allergies  Allergen Reactions  . Shellfish Allergy Anaphylaxis  . Penicillins Swelling    Childhood  . Erythromycin Rash and Other (See Comments)    Unknown     REVIEW OF SYSTEMS: A comprehensive ROS was conducted with the patient and is negative except as per HPI    OBJECTIVE:   VITAL SIGNS: BP 110/72 (BP  Location: Left Arm, Patient Position: Sitting, Cuff Size: Small)   Pulse 82   Ht 5\' 6"  (1.676 m)   Wt 213 lb 12.8 oz (97 kg)   SpO2 97%   BMI 34.51 kg/m    PHYSICAL EXAM:  General: Pt appears well and is in NAD  Neck: General: Supple without adenopathy or carotid bruits. Thyroid: Thyroid size normal.  No goiter or nodules appreciated. No thyroid bruit.  Lungs: Clear with good BS bilat with no rales, rhonchi, or wheezes  Heart: RRR with normal S1 and S2 and no gallops; no murmurs; no rub  Abdomen: Normoactive bowel sounds, soft, nontender, without masses or organomegaly palpable  Extremities:  Lower extremities - No pretibial edema. No lesions.  Neuro: MS is good with appropriate affect, pt is alert and Ox3    DM foot exam: 11/03/2019  The skin of the feet is intact without sores or ulcerations. The pedal pulses are 2+ on right and 2+ on left. The sensation is intact to a screening 5.07, 10 gram monofilament bilaterally   DATA REVIEWED:  Lab Results  Component Value Date   HGBA1C 12.1 (H) 08/31/2019   Lab Results  Component Value Date   LDLCALC UNABLE TO CALCULATE IF TRIGLYCERIDE OVER 400 mg/dL 09/01/2019   CREATININE 1.44 (H) 10/14/2019   No results found for: [facility]  Lab Results  Component Value Date   CHOL 227 (H) 09/01/2019   HDL 34 (L) 09/01/2019   LDLCALC UNABLE TO CALCULATE IF TRIGLYCERIDE OVER 400 mg/dL 09/01/2019   LDLDIRECT 71.5 09/01/2019   TRIG 755 (H) 09/01/2019   CHOLHDL 6.7 09/01/2019        ASSESSMENT / PLAN / RECOMMENDATIONS:   1) Type 2 Diabetes Mellitus, Poorly controlled, With CKD III and macrovascular  complications - Most recent A1c of 12.1 %. Goal A1c < 7.0 %.    Plan: GENERAL: I have discussed with the patient the pathophysiology of diabetes. We went over the natural progression of the disease. We talked about both insulin resistance and insulin deficiency. We stressed the importance of lifestyle changes including diet and exercise.  I explained the complications associated with diabetes including retinopathy, nephropathy, neuropathy as well as increased risk of cardiovascular disease. We went over the benefit seen with glycemic control.    I explained to the patient that diabetic patients are at higher than normal risk for amputations.   We discussed the importance of avoiding sugar-sweetened beverages  And avoiding snacks   His In-office BG was 123 mg/dL. I suspect with an A1c 12.1% he either was not taking his medication or had major dietary indiscretions  We discussed add-on therapy with SGLT-2 inhibitors, he is a great candidate  Given cardiac history. We discussed cardiovascular benefits as well as risk of genital infections, will consider this on next visit , by then hopefully will have a better idea about fluctuating renal function.   He has been tried on Trulicity in the past which was costly and caused some nausea. I have discussed with the risk of pancreatitis and especially with a triglyceride level of > 500 mg/dL, I would rather avoid this at this time  In the meantime will increase Glipizide as below, I have emphasized the importance of taking this before breakfast and supper   MEDICATIONS: - Increase Glipizide 10 mg to one and a half tablet before Breakfast and before supper - Continue Metformin 1000 mg twice daily  - Continue Pioglitazone 45 mg daily    EDUCATION / INSTRUCTIONS:  BG monitoring instructions: Patient is instructed to check his blood sugars 2 times a day, fasting and supper.  Call [facility] if: BG persistently < 70  . I reviewed the Rule of 15 for the treatment of hypoglycemia in detail with the patient. Literature supplied.   2) Diabetic complications:   Eye: Does not have known diabetic retinopathy.   Neuro/ Feet: Does not have known diabetic peripheral neuropathy.  Renal: Patient does have known baseline CKD. He is not on an ACEI/ARB at present.Will consider  starting ACE/ARB o next visit    3) Mixed Dyslipidemia: Patient is on atorvastatin 80 mg daily . LDL borderline at 71.5 mg/dL . Tg high at 755 mg/dL .We discussed increased risk  of pancreatitis. We discussed low carb diet. If this remains elevated will consider Vascepa     F/U in 2 months      Signed electronically by: Mack Guise, MD  [facility]  [facility] [address] [place], [place] [address] Phone: [phone] FAX: [phone]   CC: [address] [place] [address] Phone: [phone]  Fax: [phone]    Return to Endocrinology clinic as below: Future Appointments  Date Time Provider [place]  11/10/2019 11:20 AM Theora Gianotti, NP [id number]

## 2019-11-05 ENCOUNTER — Other Ambulatory Visit: Payer: Self-pay

## 2019-11-05 ENCOUNTER — Telehealth: Payer: Self-pay | Admitting: Internal Medicine

## 2019-11-05 ENCOUNTER — Telehealth: Payer: Self-pay | Admitting: Physician Assistant

## 2019-11-05 MED ORDER — ACCU-CHEK GUIDE VI STRP: 1.0000 | ORAL_STRIP | Freq: Two times a day (BID) | 12 refills | Status: DC

## 2019-11-05 MED ORDER — GLUCOSE BLOOD VI STRP
ORAL_STRIP | 3 refills | Status: DC
Start: 2019-11-05 — End: 2020-04-04

## 2019-11-05 NOTE — Addendum Note (Signed)
Addended by: Dorita Sciara on: 11/05/2019 11:53 AM   Modules accepted: Orders

## 2019-11-05 NOTE — Telephone Encounter (Signed)
Patient called stating he needed one touch test strips, not accu-chek.  [id number] - [place], [place]  [address] [place] [address]  Phone:  [phone] Fax:  [phone]

## 2019-11-05 NOTE — Telephone Encounter (Signed)
Pt reports that he was given a meter by Dr. Kelton Pillar at yesterday's visit. Was this an Accu Chek meter or OneTouch meter?

## 2019-11-05 NOTE — Telephone Encounter (Signed)
Rx sent 

## 2019-11-05 NOTE — Telephone Encounter (Signed)
Pt said his insurance will cover the test strips and he is asking if Dr. Kelton Pillar will send them to [facility].

## 2019-11-10 ENCOUNTER — Other Ambulatory Visit: Payer: Self-pay

## 2019-11-10 ENCOUNTER — Encounter: Payer: Self-pay | Admitting: Nurse Practitioner

## 2019-11-10 ENCOUNTER — Ambulatory Visit (INDEPENDENT_AMBULATORY_CARE_PROVIDER_SITE_OTHER): Payer: 59 | Admitting: Nurse Practitioner

## 2019-11-10 VITALS — BP 108/78 | HR 63 | Ht 66.0 in | Wt 212.0 lb

## 2019-11-10 DIAGNOSIS — Z720 Tobacco use: Secondary | ICD-10-CM

## 2019-11-10 DIAGNOSIS — E781 Pure hyperglyceridemia: Secondary | ICD-10-CM

## 2019-11-10 DIAGNOSIS — I10 Essential (primary) hypertension: Secondary | ICD-10-CM

## 2019-11-10 DIAGNOSIS — E1122 Type 2 diabetes mellitus with diabetic chronic kidney disease: Secondary | ICD-10-CM

## 2019-11-10 DIAGNOSIS — E785 Hyperlipidemia, unspecified: Secondary | ICD-10-CM

## 2019-11-10 DIAGNOSIS — I25118 Atherosclerotic heart disease of native coronary artery with other forms of angina pectoris: Secondary | ICD-10-CM | POA: Diagnosis not present

## 2019-11-10 DIAGNOSIS — N183 Chronic kidney disease, stage 3 unspecified: Secondary | ICD-10-CM

## 2019-11-10 MED ORDER — RANOLAZINE ER 500 MG PO TB12: 500.0000 mg | ORAL_TABLET | Freq: Two times a day (BID) | ORAL | 5 refills | Status: DC

## 2019-11-10 NOTE — Progress Notes (Signed)
Office Visit    Patient Name: [name] Date of Encounter: 11/10/2019  Primary Care Provider:  Cyndi Bender, PA-C Primary Cardiologist:  Kathlyn Sacramento, MD  Chief Complaint    59 year old male with history of CAD status post LAD and RCA stenting, hypertension, hyperlipidemia, obesity, sleep apnea, tobacco abuse, and GERD, presents for follow-up for ongoing chest pain status post RCA stenting.  Past Medical History    Past Medical History:  Diagnosis Date  . CKD (chronic kidney disease), stage III   . Coronary artery disease    a.  H/o NSTEMI s/p LAD/RCA stenting; b. 08/2019 PCI: RCA 99p (2.5x15 Resolute Onyx DES), 75p/m, mild ISR; c. 09/2019 PCI: 85p - heavily calcified (intravascular lithotripsy & 3.0x26 Resolute Onyx DES); d. 10/18/19 Relook cath: LM 25, LAD 67m ISR, 75d, 70/50d/apical, RI 80, LCX 50p/m/d, RCA 30p ISR, 6m ISR, 50d, RPDA 60-->Med Rx.  . Diabetes mellitus without complication ([place])   . Diastolic dysfunction    a. 08/2019 Echo: EF 50-55%, no rwma, Gr1 DD, nl RV size/fxn. Triv MR.  Marland Kitchen GERD (gastroesophageal reflux disease)   . Hyperlipidemia   . Hypertension   . NSTEMI (non-ST elevated myocardial infarction) ([place]) 08/2019  . Sleep apnea   . Tobacco abuse    Past Surgical History:  Procedure Laterality Date  . APPENDECTOMY    . CARDIAC CATHETERIZATION    . COLONOSCOPY    . COLONOSCOPY WITH PROPOFOL N/A 09/16/2017   Procedure: COLONOSCOPY WITH PROPOFOL;  Surgeon: Toledo, Benay Pike, MD;  Location: [facility];  Service: Gastroenterology;  Laterality: N/A;  (+) DM - oral  . CORONARY STENT INTERVENTION N/A 08/31/2019   Procedure: CORONARY STENT INTERVENTION;  Surgeon: Yolonda Kida, MD;  Location: [facility];  Service: Cardiovascular;  Laterality: N/A;  . CORONARY STENT INTERVENTION  09/29/2019  . CORONARY STENT INTERVENTION N/A 09/29/2019   Procedure: CORONARY STENT INTERVENTION;  Surgeon: Wellington Hampshire, MD;  Location: [facility];   Service: Cardiovascular;  Laterality: N/A;  . EXPLORATORY LAPAROTOMY    . EYE SURGERY    . HERNIA REPAIR    . LEFT HEART CATH AND CORONARY ANGIOGRAPHY N/A 08/31/2019   Procedure: LEFT HEART CATH AND CORONARY ANGIOGRAPHY possible PCI and stent;  Surgeon: Yolonda Kida, MD;  Location: [facility];  Service: Cardiovascular;  Laterality: N/A;  . LEFT HEART CATH AND CORONARY ANGIOGRAPHY Left 10/18/2019   Procedure: LEFT HEART CATH AND CORONARY ANGIOGRAPHY;  Surgeon: Wellington Hampshire, MD;  Location: [facility];  Service: Cardiovascular;  Laterality: Left;    Allergies  Allergies  Allergen Reactions  . Shellfish Allergy Anaphylaxis  . Penicillins Swelling    Childhood  . Erythromycin Rash and Other (See Comments)    Unknown    History of Present Illness    59 year old male with above complex past medical history including coronary artery disease, diabetes, GERD, hypertension, hyperlipidemia, sleep apnea, obesity, tobacco abuse.  He previously suffered a non-STEMI and underwent stenting of the LAD and mid right coronary artery.  In July 2021, in the setting of unstable angina, he underwent repeat diagnostic catheterization revealing moderate diffuse disease throughout his left coronary tree with severe proximal RCA disease.  The most proximal lesion was successfully treated with a drug-eluting stent on July 13, while he had residual moderate to severe heavily calcified stenosis in the proximal to mid vessel.  Previously placed mid RCA stent had 50% restenosis.  Unfortunately, he continued have exertional  chest discomfort following PCI and underwent repeat catheterization on August 11 with successful intravascular lithotripsy and drug-eluting stent placement in the proximal RCA.  This was a very difficult procedure in the setting of heavy calcification and previous stent placement previously placed stents remain patent.  Following PCI, he felt well for about a day but  subsequently developed recurrent exertional substernal chest discomfort.  On August 20, he was seen in clinic and isosorbide therapy was titrated to 90 mg daily and we discussed possibly pursuing repeat catheterization.  At follow-up with Dr. Fletcher Anon, he reported no improvement in angina and he underwent relook diagnostic catheterization on August 30 revealing patent LAD and RCA stents with moderate diffuse distal LAD disease, and 80% stenosis in a small ramus intermedius, moderate diffuse circumflex disease, and moderate distal RCA/RPDA disease.  Continue medical therapy was recommended and was felt that with any progression of disease in the future, he would require evaluation for CABG.  Since his most recent catheterization, he reports being relatively stable.  He has no symptoms at rest.  He does experience mild chest discomfort associated with dyspnea if walking longer distances, such as walking from the medical mall to our office today.  Symptoms resolve fairly quickly with rest and he has not required sublingual nitrates.  He says that he has increased his isosorbide to 120 mg daily after recently being provided a prescription, presumably after his catheterization, though I do not see that as noted in epic.  He has tolerated this higher dose.  He denies palpitations, PND, orthopnea, dizziness, syncope, edema, or early satiety.  Home Medications    Prior to Admission medications   Medication Sig Start Date End Date Taking? Authorizing Provider  aspirin EC 81 MG tablet Take 81 mg by mouth daily.    [provider]  atorvastatin (LIPITOR) 80 MG tablet Take 80 mg by mouth daily.    [provider]  carvedilol (COREG) 3.125 MG tablet Take 1 tablet (3.125 mg total) by mouth 2 (two) times daily with a meal. 09/30/19   [facility], Tami Lin, PA  Cinnamon 500 MG capsule Take 1,000 mg by mouth daily.    [provider]  clopidogrel (PLAVIX) 75 MG tablet Take 75 mg by mouth daily.     [provider]  EPINEPHrine 0.3 mg/0.3 mL IJ SOAJ injection Inject 0.3 mg into the muscle as needed for anaphylaxis. 06/10/15   [provider]  famotidine (PEPCID) 20 MG tablet Take 20 mg by mouth daily.    [provider]  glipiZIDE (GLUCOTROL) 10 MG tablet Take 1.5 tablets (15 mg total) by mouth 2 (two) times daily before a meal. 11/03/19   Shamleffer, Melanie Crazier, MD  glucose blood test strip Use OneTouch Verio test strips as instructed to check blood sugar twice daily. DX:E11.65 11/05/19   Shamleffer, Melanie Crazier, MD  hydrochlorothiazide (HYDRODIURIL) 25 MG tablet Take 1 tablet (25 mg total) by mouth daily. 09/28/19   Wellington Hampshire, MD  isosorbide mononitrate (IMDUR) 60 MG 24 hr tablet Take 1.5 tablets (90 mg total) by mouth daily. 10/08/19   Theora Gianotti, NP  metFORMIN (GLUCOPHAGE) 1000 MG tablet Take 1 tablet (1,000 mg total) by mouth 2 (two) times daily with a meal. Restart on 10/02/19. 11/03/19   Shamleffer, Melanie Crazier, MD  metoprolol tartrate (LOPRESSOR) 25 MG tablet Take 25 mg by mouth 2 (two) times daily. 11/04/19   [provider]  nitroGLYCERIN (NITROSTAT) 0.4 MG SL tablet Place 0.4 mg under  the tongue every 5 (five) minutes as needed for chest pain.  08/19/19   [provider]  pioglitazone (ACTOS) 45 MG tablet Take 1 tablet (45 mg total) by mouth daily. 11/03/19   Shamleffer, Melanie Crazier, MD  vitamin B-12 (CYANOCOBALAMIN) 1000 MCG tablet Take 1,000 mcg by mouth daily.    [provider]    Review of Systems    Ongoing exertional and substernal chest discomfort with prolonged walking or activities.  He denies palpitations, PND, orthopnea, dizziness, syncope, edema, or early satiety.  All other systems reviewed and are otherwise negative except as noted above.  Physical Exam    VS:  BP 108/78 (BP Location: Left Arm, Patient Position: Sitting, Cuff Size: Normal)   Pulse 63   Ht 5\' 6"  (1.676 m)   Wt 212  lb (96.2 kg)   SpO2 98%   BMI 34.22 kg/m  , BMI Body mass index is 34.22 kg/m. GEN: Obese, in no acute distress. HEENT: normal. Neck: Supple, no JVD, carotid bruits, or masses. Cardiac: RRR, no murmurs, rubs, or gallops. No clubbing, cyanosis, edema.  Radials/PT 2+ and equal bilaterally.  Right radial catheterization site without bleeding, bruit, or hematoma. Respiratory:  Respirations regular and unlabored, clear to auscultation bilaterally. GI: Soft, nontender, nondistended, BS + x 4. MS: no deformity or atrophy. Skin: warm and dry, no rash. Neuro:  Strength and sensation are intact. Psych: Normal affect.  Accessory Clinical Findings    ECG personally reviewed by me today -regular sinus rhythm, 63, left axis deviation, borderline first-degree AV block - no acute changes.  Lab Results  Component Value Date   WBC 8.8 10/14/2019   HGB 13.0 10/14/2019   HCT 38.4 (L) 10/14/2019   MCV 93.2 10/14/2019   PLT 257 10/14/2019   Lab Results  Component Value Date   CREATININE 1.44 (H) 10/14/2019   BUN 22 (H) 10/14/2019   NA 135 10/14/2019   K 4.2 10/14/2019   CL 99 10/14/2019   CO2 24 10/14/2019   Lab Results  Component Value Date   ALT 20 07/07/2014   AST 22 07/07/2014   ALKPHOS 51 07/07/2014   BILITOT 0.4 07/07/2014   Lab Results  Component Value Date   CHOL 227 (H) 09/01/2019   HDL 34 (L) 09/01/2019   LDLCALC UNABLE TO CALCULATE IF TRIGLYCERIDE OVER 400 mg/dL 09/01/2019   LDLDIRECT 71.5 09/01/2019   TRIG 755 (H) 09/01/2019   CHOLHDL 6.7 09/01/2019    Lab Results  Component Value Date   HGBA1C 12.1 (H) 08/31/2019    Assessment & Plan    1.  Coronary artery disease with stable angina: Status post PCI and drug-eluting stent placement to the RCA x2 this summer.  He continued to have chest discomfort at August 20 follow-up and subsequently underwent repeat catheterization August 30 showing stable right coronary disease with known diffuse distal LAD, circumflex, and  ramus disease.  Continued medical therapy was recommended.  He is now on isosorbide mononitrate 120 mg daily in addition to aspirin, statin, Plavix, and beta-blocker.  He has continued to have stable angina when walking about 30 yards or doing any sort of housework such as vacuuming.  Symptoms last just a few minutes and resolve with rest.  He has been contacted by cardiac rehabilitation and is looking to begin next week.  He has also been seen by endocrinology with changes made.  He believes his sugars have already started to improve.  He is willing to try Ranexa 500 mg  twice daily (QTc nl) and this has been added today.  Hopefully with rehab and medical therapy, symptoms will improve.  2.  Essential hypertension: Stable.  3.  Hyperlipidemia/hypertriglyceridemia: Total cholesterol 227 with direct LDL of 71.5 on July 14.  He remains on high potency statin therapy.  Again discussed lifestyle modification.  Could consider Vascepa in the future though cost is likely to be prohibitive.  4.  Type 2 diabetes mellitus: A1c of 12.1 in July.  He is now followed by endocrinology with possible plans to add SGLT2 inhibitor in the future.  5.  Tobacco abuse: He continues to smoke but does appear to be more motivated to quit at this point.  He says he has a plan.  6.  Morbid obesity: He will start cardiac rehab next week.  7.  CKD III: stable in late August.    8.  Disposition: Follow-up in clinic in approximately 6 weeks.   Murray Hodgkins, NP 11/10/2019, 1:08 PM

## 2019-11-10 NOTE — Patient Instructions (Signed)
Medication Instructions:  Your physician has recommended you make the following change in your medication:   START ranolazine (RANEXA) 500 MG 12 hr tablet: Take 1 tablet (500 mg total) by mouth 2 (two) times daily.  *If you need a refill on your cardiac medications before your next appointment, please call your pharmacy*   Lab Work: None Ordered If you have labs (blood work) drawn today and your tests are completely normal, you will receive your results only by: Marland Kitchen MyChart Message (if you have MyChart) OR . A paper copy in the mail If you have any lab test that is abnormal or we need to change your treatment, we will call you to review the results.   Testing/Procedures: None Ordered   Follow-Up: At [facility], you and your health needs are our priority.  As part of our continuing mission to provide you with exceptional heart care, we have created designated Provider Care Teams.  These Care Teams include your primary Cardiologist (physician) and Advanced Practice Providers (APPs -  Physician Assistants and Nurse Practitioners) who all work together to provide you with the care you need, when you need it.  We recommend signing up for the patient portal called "MyChart".  Sign up information is provided on this After Visit Summary.  MyChart is used to connect with patients for Virtual Visits (Telemedicine).  Patients are able to view lab/test results, encounter notes, upcoming appointments, etc.  Non-urgent messages can be sent to your provider as well.   To learn more about what you can do with MyChart, go to [email or web address].    Your next appointment:   6 week(s)  The format for your next appointment:   In Person  Provider:   You may see Kathlyn Sacramento, MD or one of the following Advanced Practice Providers on your designated Care Team:    Murray Hodgkins, NP     Other Instructions

## 2019-11-11 ENCOUNTER — Other Ambulatory Visit: Payer: Self-pay

## 2019-11-11 ENCOUNTER — Encounter: Payer: 59 | Attending: Cardiovascular Disease

## 2019-11-11 DIAGNOSIS — E1122 Type 2 diabetes mellitus with diabetic chronic kidney disease: Secondary | ICD-10-CM | POA: Insufficient documentation

## 2019-11-11 DIAGNOSIS — K219 Gastro-esophageal reflux disease without esophagitis: Secondary | ICD-10-CM | POA: Insufficient documentation

## 2019-11-11 DIAGNOSIS — F17210 Nicotine dependence, cigarettes, uncomplicated: Secondary | ICD-10-CM | POA: Insufficient documentation

## 2019-11-11 DIAGNOSIS — N183 Chronic kidney disease, stage 3 unspecified: Secondary | ICD-10-CM | POA: Insufficient documentation

## 2019-11-11 DIAGNOSIS — Z79899 Other long term (current) drug therapy: Secondary | ICD-10-CM | POA: Insufficient documentation

## 2019-11-11 DIAGNOSIS — Z7982 Long term (current) use of aspirin: Secondary | ICD-10-CM | POA: Insufficient documentation

## 2019-11-11 DIAGNOSIS — Z7902 Long term (current) use of antithrombotics/antiplatelets: Secondary | ICD-10-CM | POA: Insufficient documentation

## 2019-11-11 DIAGNOSIS — Z7984 Long term (current) use of oral hypoglycemic drugs: Secondary | ICD-10-CM | POA: Insufficient documentation

## 2019-11-11 DIAGNOSIS — I129 Hypertensive chronic kidney disease with stage 1 through stage 4 chronic kidney disease, or unspecified chronic kidney disease: Secondary | ICD-10-CM | POA: Insufficient documentation

## 2019-11-11 DIAGNOSIS — I2510 Atherosclerotic heart disease of native coronary artery without angina pectoris: Secondary | ICD-10-CM | POA: Insufficient documentation

## 2019-11-11 DIAGNOSIS — I252 Old myocardial infarction: Secondary | ICD-10-CM | POA: Insufficient documentation

## 2019-11-11 DIAGNOSIS — Z955 Presence of coronary angioplasty implant and graft: Secondary | ICD-10-CM

## 2019-11-11 NOTE — Progress Notes (Signed)
Virtual Visit completed. Patient informed on EP and RD appointment and 6 Minute walk test. Patient also informed of patient health questionnaires on My Chart. Patient Verbalizes understanding. Visit diagnosis can be found in [facility] 10/18/2019.

## 2019-11-15 ENCOUNTER — Other Ambulatory Visit: Payer: Self-pay

## 2019-11-15 VITALS — Ht 66.5 in | Wt 211.8 lb

## 2019-11-15 DIAGNOSIS — Z955 Presence of coronary angioplasty implant and graft: Secondary | ICD-10-CM | POA: Diagnosis not present

## 2019-11-15 DIAGNOSIS — Z79899 Other long term (current) drug therapy: Secondary | ICD-10-CM | POA: Diagnosis not present

## 2019-11-15 DIAGNOSIS — I252 Old myocardial infarction: Secondary | ICD-10-CM | POA: Diagnosis not present

## 2019-11-15 DIAGNOSIS — F17210 Nicotine dependence, cigarettes, uncomplicated: Secondary | ICD-10-CM | POA: Diagnosis not present

## 2019-11-15 DIAGNOSIS — I2510 Atherosclerotic heart disease of native coronary artery without angina pectoris: Secondary | ICD-10-CM | POA: Diagnosis not present

## 2019-11-15 DIAGNOSIS — I129 Hypertensive chronic kidney disease with stage 1 through stage 4 chronic kidney disease, or unspecified chronic kidney disease: Secondary | ICD-10-CM | POA: Diagnosis not present

## 2019-11-15 DIAGNOSIS — E1122 Type 2 diabetes mellitus with diabetic chronic kidney disease: Secondary | ICD-10-CM | POA: Diagnosis not present

## 2019-11-15 DIAGNOSIS — K219 Gastro-esophageal reflux disease without esophagitis: Secondary | ICD-10-CM | POA: Diagnosis not present

## 2019-11-15 DIAGNOSIS — N183 Chronic kidney disease, stage 3 unspecified: Secondary | ICD-10-CM | POA: Diagnosis not present

## 2019-11-15 DIAGNOSIS — Z7984 Long term (current) use of oral hypoglycemic drugs: Secondary | ICD-10-CM | POA: Diagnosis not present

## 2019-11-15 DIAGNOSIS — Z7902 Long term (current) use of antithrombotics/antiplatelets: Secondary | ICD-10-CM | POA: Diagnosis not present

## 2019-11-15 DIAGNOSIS — Z7982 Long term (current) use of aspirin: Secondary | ICD-10-CM | POA: Diagnosis not present

## 2019-11-15 NOTE — Telephone Encounter (Signed)
A return to work date has not been determined. Message fwd to Dr. Fletcher Anon to give his recommendation.

## 2019-11-15 NOTE — Patient Instructions (Signed)
Patient Instructions  Patient Details  Name: [name] MRN: [id number] Date of Birth: April 11, 1960 Referring Provider:  Wellington Hampshire, MD  Below are your personal goals for exercise, nutrition, and risk factors. Our goal is to help you stay on track towards obtaining and maintaining these goals. We will be discussing your progress on these goals with you throughout the program.  Initial Exercise Prescription:  Initial Exercise Prescription - 11/15/19 1600      Date of Initial Exercise RX and Referring Provider   Date 11/15/19    Referring Provider Arida      Treadmill   MPH 1    Grade 0    Minutes 15    METs 2      Recumbant Bike   Level 1    RPM 60    Minutes 15    METs 2      NuStep   Level 1    SPM 80    Minutes 15    METs 2      REL-XR   Level 1    Speed 50    Minutes 15    METs 2      T5 Nustep   Level 1    SPM 80    Minutes 15    METs 2      Prescription Details   Frequency (times per week) 3    Duration Progress to 30 minutes of continuous aerobic without signs/symptoms of physical distress      Intensity   THRR 40-80% of Max Heartrate 104-140    Ratings of Perceived Exertion 11-13    Perceived Dyspnea 0-4      Progression   Progression Continue to progress workloads to maintain intensity without signs/symptoms of physical distress.      Resistance Training   Training Prescription Yes    Weight 3 lb    Reps 10-15           Exercise Goals: Frequency: Be able to perform aerobic exercise two to three times per week in program working toward 2-5 days per week of home exercise.  Intensity: Work with a perceived exertion of 11 (fairly light) - 15 (hard) while following your exercise prescription.  We will make changes to your prescription with you as you progress through the program.   Duration: Be able to do 30 to 45 minutes of continuous aerobic exercise in addition to a 5 minute warm-up and a 5 minute cool-down routine.   Nutrition  Goals: Your personal nutrition goals will be established when you do your nutrition analysis with the dietician.  The following are general nutrition guidelines to follow: Cholesterol < 200mg /day Sodium < 1500mg /day Fiber: Men over 50 yrs - 30 grams per day  Personal Goals:  Personal Goals and Risk Factors at Admission - 11/15/19 1623      Core Components/Risk Factors/Patient Goals on Admission    Weight Management Yes;Weight Loss    Intervention Weight Management: Develop a combined nutrition and exercise program designed to reach desired caloric intake, while maintaining appropriate intake of nutrient and fiber, sodium and fats, and appropriate energy expenditure required for the weight goal.;Weight Management: Provide education and appropriate resources to help participant work on and attain dietary goals.;Weight Management/Obesity: Establish reasonable short term and long term weight goals.    Admit Weight 211 lb 12.8 oz (96.1 kg)    Goal Weight: Short Term 205 lb (93 kg)    Goal Weight: Long Term 200 lb (90.7  kg)    Expected Outcomes Short Term: Continue to assess and modify interventions until short term weight is achieved;Long Term: Adherence to nutrition and physical activity/exercise program aimed toward attainment of established weight goal;Weight Maintenance: Understanding of the daily nutrition guidelines, which includes 25-35% calories from fat, 7% or less cal from saturated fats, less than 200mg  cholesterol, less than 1.5gm of sodium, & 5 or more servings of fruits and vegetables daily;Weight Loss: Understanding of general recommendations for a balanced deficit meal plan, which promotes 1-2 lb weight loss per week and includes a negative energy balance of [phone] kcal/d;Understanding of distribution of calorie intake throughout the day with the consumption of 4-5 meals/snacks;Understanding recommendations for meals to include 15-35% energy as protein, 25-35% energy from fat, 35-60%  energy from carbohydrates, less than 200mg  of dietary cholesterol, 20-35 gm of total fiber daily    Tobacco Cessation Yes    Number of packs per day Half    Intervention Assist the participant in steps to quit. Provide individualized education and counseling about committing to Tobacco Cessation, relapse prevention, and pharmacological support that can be provided by physician.;Advice worker, assist with locating and accessing local/national Quit Smoking programs, and support quit date choice.    Expected Outcomes Short Term: Will demonstrate readiness to quit, by selecting a quit date.;Short Term: Will quit all tobacco product use, adhering to prevention of relapse plan.;Long Term: Complete abstinence from all tobacco products for at least 12 months from quit date.    Diabetes Yes    Intervention Provide education about signs/symptoms and action to take for hypo/hyperglycemia.;Provide education about proper nutrition, including hydration, and aerobic/resistive exercise prescription along with prescribed medications to achieve blood glucose in normal ranges: Fasting glucose 65-99 mg/dL    Expected Outcomes Short Term: Participant verbalizes understanding of the signs/symptoms and immediate care of hyper/hypoglycemia, proper foot care and importance of medication, aerobic/resistive exercise and nutrition plan for blood glucose control.;Long Term: Attainment of HbA1C < 7%.    Hypertension Yes    Intervention Provide education on lifestyle modifcations including regular physical activity/exercise, weight management, moderate sodium restriction and increased consumption of fresh fruit, vegetables, and low fat dairy, alcohol moderation, and smoking cessation.;Monitor prescription use compliance.    Expected Outcomes Short Term: Continued assessment and intervention until BP is < 140/64mm HG in hypertensive participants. < 130/8mm HG in hypertensive participants with diabetes, heart failure or  chronic kidney disease.;Long Term: Maintenance of blood pressure at goal levels.    Lipids Yes    Intervention Provide education and support for participant on nutrition & aerobic/resistive exercise along with prescribed medications to achieve LDL 70mg , HDL >40mg .    Expected Outcomes Short Term: Participant states understanding of desired cholesterol values and is compliant with medications prescribed. Participant is following exercise prescription and nutrition guidelines.;Long Term: Cholesterol controlled with medications as prescribed, with individualized exercise RX and with personalized nutrition plan. Value goals: LDL < 70mg , HDL > 40 mg.           Tobacco Use Initial Evaluation: Social History   Tobacco Use  Smoking Status Current Every Day Smoker  . Packs/day: 0.50  . Years: 50.00  . Pack years: 25.00  . Types: Cigarettes  Smokeless Tobacco Never Used  Tobacco Comment   Patient states he is ready to quit    Exercise Goals and Review:  Exercise Goals    Row Name 11/15/19 1619             Exercise Goals  Increase Physical Activity Yes       Intervention Provide advice, education, support and counseling about physical activity/exercise needs.;Develop an individualized exercise prescription for aerobic and resistive training based on initial evaluation findings, risk stratification, comorbidities and participant's personal goals.       Expected Outcomes Short Term: Attend rehab on a regular basis to increase amount of physical activity.;Long Term: Add in home exercise to make exercise part of routine and to increase amount of physical activity.;Long Term: Exercising regularly at least 3-5 days a week.       Increase Strength and Stamina Yes       Intervention Provide advice, education, support and counseling about physical activity/exercise needs.;Develop an individualized exercise prescription for aerobic and resistive training based on initial evaluation findings, risk  stratification, comorbidities and participant's personal goals.       Expected Outcomes Short Term: Increase workloads from initial exercise prescription for resistance, speed, and METs.;Short Term: Perform resistance training exercises routinely during rehab and add in resistance training at home;Long Term: Improve cardiorespiratory fitness, muscular endurance and strength as measured by increased METs and functional capacity (6MWT)       Able to understand and use rate of perceived exertion (RPE) scale Yes       Intervention Provide education and explanation on how to use RPE scale       Expected Outcomes Short Term: Able to use RPE daily in rehab to express subjective intensity level;Long Term:  Able to use RPE to guide intensity level when exercising independently       Able to understand and use Dyspnea scale Yes       Intervention Provide education and explanation on how to use Dyspnea scale       Expected Outcomes Short Term: Able to use Dyspnea scale daily in rehab to express subjective sense of shortness of breath during exertion;Long Term: Able to use Dyspnea scale to guide intensity level when exercising independently       Knowledge and understanding of Target Heart Rate Range (THRR) Yes       Intervention Provide education and explanation of THRR including how the numbers were predicted and where they are located for reference       Expected Outcomes Short Term: Able to state/look up THRR;Short Term: Able to use daily as guideline for intensity in rehab;Long Term: Able to use THRR to govern intensity when exercising independently       Able to check pulse independently Yes       Intervention Provide education and demonstration on how to check pulse in carotid and radial arteries.;Review the importance of being able to check your own pulse for safety during independent exercise       Expected Outcomes Short Term: Able to explain why pulse checking is important during independent  exercise;Long Term: Able to check pulse independently and accurately       Understanding of Exercise Prescription Yes       Intervention Provide education, explanation, and written materials on patient's individual exercise prescription       Expected Outcomes Short Term: Able to explain program exercise prescription;Long Term: Able to explain home exercise prescription to exercise independently              Copy of goals given to participant.

## 2019-11-15 NOTE — Progress Notes (Signed)
Cardiac Individual Treatment Plan  Patient Details  Name: [name] MRN: [id number] Date of Birth: Dec 25, 1960 Referring Provider:     Cardiac Rehab from 11/15/2019 in [facility] Cardiac and Pulmonary Rehab  Referring Provider Arida      Initial Encounter Date:    Cardiac Rehab from 11/15/2019 in [facility] Cardiac and Pulmonary Rehab  Date 11/15/19      Visit Diagnosis: Status post coronary artery stent placement  Patient's Home Medications on Admission:  Current Outpatient Medications:  .  aspirin EC 81 MG tablet, Take 81 mg by mouth daily., Disp: , Rfl:  .  atorvastatin (LIPITOR) 80 MG tablet, Take 80 mg by mouth daily., Disp: , Rfl:  .  carvedilol (COREG) 3.125 MG tablet, Take 1 tablet (3.125 mg total) by mouth 2 (two) times daily with a meal., Disp: 180 tablet, Rfl: 3 .  Cinnamon 500 MG capsule, Take 1,000 mg by mouth daily., Disp: , Rfl:  .  clopidogrel (PLAVIX) 75 MG tablet, Take 75 mg by mouth daily., Disp: , Rfl:  .  EPINEPHrine 0.3 mg/0.3 mL IJ SOAJ injection, Inject 0.3 mg into the muscle as needed for anaphylaxis., Disp: , Rfl:  .  famotidine (PEPCID) 20 MG tablet, Take 20 mg by mouth daily., Disp: , Rfl:  .  glipiZIDE (GLUCOTROL) 10 MG tablet, Take 1.5 tablets (15 mg total) by mouth 2 (two) times daily before a meal., Disp: 270 tablet, Rfl: 1 .  glucose blood test strip, Use OneTouch Verio test strips as instructed to check blood sugar twice daily. DX:E11.65, Disp: 100 each, Rfl: 3 .  hydrochlorothiazide (HYDRODIURIL) 25 MG tablet, Take 1 tablet (25 mg total) by mouth daily., Disp: , Rfl:  .  isosorbide mononitrate (IMDUR) 60 MG 24 hr tablet, Take 1.5 tablets (90 mg total) by mouth daily., Disp: 45 tablet, Rfl: 5 .  metFORMIN (GLUCOPHAGE) 1000 MG tablet, Take 1 tablet (1,000 mg total) by mouth 2 (two) times daily with a meal. Restart on 10/02/19., Disp: 180 tablet, Rfl: 3 .  nitroGLYCERIN (NITROSTAT) 0.4 MG SL tablet, Place 0.4 mg under the tongue every 5 (five) minutes as needed  for chest pain. , Disp: , Rfl:  .  pioglitazone (ACTOS) 45 MG tablet, Take 1 tablet (45 mg total) by mouth daily., Disp: 90 tablet, Rfl: 1 .  ranolazine (RANEXA) 500 MG 12 hr tablet, Take 1 tablet (500 mg total) by mouth 2 (two) times daily., Disp: 60 tablet, Rfl: 5 .  vitamin B-12 (CYANOCOBALAMIN) 1000 MCG tablet, Take 1,000 mcg by mouth daily., Disp: , Rfl:   Past Medical History: Past Medical History:  Diagnosis Date  . CKD (chronic kidney disease), stage III   . Coronary artery disease    a.  H/o NSTEMI s/p LAD/RCA stenting; b. 08/2019 PCI: RCA 99p (2.5x15 Resolute Onyx DES), 75p/m, mild ISR; c. 09/2019 PCI: 85p - heavily calcified (intravascular lithotripsy & 3.0x26 Resolute Onyx DES); d. 10/18/19 Relook cath: LM 25, LAD 23mISR, 75d, 70/50d/apical, RI 80, LCX 50p/m/d, RCA 30p ISR, 239mSR, 50d, RPDA 60-->Med Rx.  . Diabetes mellitus without complication ([facility]  . Diastolic dysfunction    a. 08/2019 Echo: EF 50-55%, no rwma, Gr1 DD, nl RV size/fxn. Triv MR.  . Marland KitchenERD (gastroesophageal reflux disease)   . Hyperlipidemia   . Hypertension   . [place]STEMI (non-ST elevated myocardial infarction) ([facility] [place]  . Sleep apnea   . Tobacco abuse     Tobacco Use: Social History   Tobacco Use  Smoking  Status Current Every Day Smoker  . Packs/day: 0.50  . Years: 50.00  . Pack years: 25.00  . Types: Cigarettes  Smokeless Tobacco Never Used  Tobacco Comment   Patient states he is ready to quit    Labs: Recent Review Flowsheet Data    Labs for ITP Cardiac and Pulmonary Rehab Latest Ref Rng & Units 08/31/2019 09/01/2019   Cholestrol 0 - 200 mg/dL - 227(H)   LDLCALC 0 - 99 mg/dL - UNABLE TO CALCULATE IF TRIGLYCERIDE OVER 400 mg/dL   LDLDIRECT 0 - 99 mg/dL - 71.5   HDL >40 mg/dL - 34(L)   Trlycerides <150 mg/dL - 755(H)   Hemoglobin A1c 4.8 - 5.6 % 12.1(H) -       Exercise Target Goals: Exercise Program Goal: Individual exercise prescription set using results from initial 6 min walk test and  THRR while considering  patient's activity barriers and safety.   Exercise Prescription Goal: Initial exercise prescription builds to 30-45 minutes a day of aerobic activity, 2-3 days per week.  Home exercise guidelines will be given to patient during program as part of exercise prescription that the participant will acknowledge.   Education: Aerobic Exercise & Resistance Training: - Gives group verbal and written instruction on the various components of exercise. Focuses on aerobic and resistive training programs and the benefits of this training and how to safely progress through these programs..   Education: Exercise & Equipment Safety: - Individual verbal instruction and demonstration of equipment use and safety with use of the equipment.   Cardiac Rehab from 11/15/2019 in [facility] Cardiac and Pulmonary Rehab  Date 11/15/19  Educator AS  Instruction Review Code 1- Verbalizes Understanding      Education: Exercise Physiology & General Exercise Guidelines: - Group verbal and written instruction with models to review the exercise physiology of the cardiovascular system and associated critical values. Provides general exercise guidelines with specific guidelines to those with heart or lung disease.    Education: Flexibility, Balance, Mind/Body Relaxation: Provides group verbal/written instruction on the benefits of flexibility and balance training, including mind/body exercise modes such as yoga, pilates and tai chi.  Demonstration and skill practice provided.   Activity Barriers & Risk Stratification:   6 Minute Walk:  6 Minute Walk    Row Name 11/15/19 1610         6 Minute Walk   Phase Initial     Distance 975 feet     Walk Time 6 minutes     # of Rest Breaks 0     MPH 1.85     METS 2.1     RPE 11     Perceived Dyspnea  1     VO2 Peak 7.4     Symptoms Yes (comment)     Comments hip pain 3/10     Resting HR 67 bpm     Resting BP 100/68     Resting Oxygen Saturation  97  %     Exercise Oxygen Saturation  during 6 min walk 98 %     Max Ex. HR 76 bpm     Max Ex. BP 112/56     2 Minute Post BP 92/56            Oxygen Initial Assessment:   Oxygen Re-Evaluation:   Oxygen Discharge (Final Oxygen Re-Evaluation):   Initial Exercise Prescription:  Initial Exercise Prescription - 11/15/19 1600      Date of Initial Exercise RX and Referring Provider  Date 11/15/19    Referring Provider Arida      Treadmill   MPH 1    Grade 0    Minutes 15    METs 2      Recumbant Bike   Level 1    RPM 60    Minutes 15    METs 2      NuStep   Level 1    SPM 80    Minutes 15    METs 2      REL-XR   Level 1    Speed 50    Minutes 15    METs 2      T5 Nustep   Level 1    SPM 80    Minutes 15    METs 2      Prescription Details   Frequency (times per week) 3    Duration Progress to 30 minutes of continuous aerobic without signs/symptoms of physical distress      Intensity   THRR 40-80% of Max Heartrate 104-140    Ratings of Perceived Exertion 11-13    Perceived Dyspnea 0-4      Progression   Progression Continue to progress workloads to maintain intensity without signs/symptoms of physical distress.      Resistance Training   Training Prescription Yes    Weight 3 lb    Reps 10-15           Perform Capillary Blood Glucose checks as needed.  Exercise Prescription Changes:   Exercise Comments:   Exercise Goals and Review:  Exercise Goals    Row Name 11/15/19 1619             Exercise Goals   Increase Physical Activity Yes       Intervention Provide advice, education, support and counseling about physical activity/exercise needs.;Develop an individualized exercise prescription for aerobic and resistive training based on initial evaluation findings, risk stratification, comorbidities and participant's personal goals.       Expected Outcomes Short Term: Attend rehab on a regular basis to increase amount of physical  activity.;Long Term: Add in home exercise to make exercise part of routine and to increase amount of physical activity.;Long Term: Exercising regularly at least 3-5 days a week.       Increase Strength and Stamina Yes       Intervention Provide advice, education, support and counseling about physical activity/exercise needs.;Develop an individualized exercise prescription for aerobic and resistive training based on initial evaluation findings, risk stratification, comorbidities and participant's personal goals.       Expected Outcomes Short Term: Increase workloads from initial exercise prescription for resistance, speed, and METs.;Short Term: Perform resistance training exercises routinely during rehab and add in resistance training at home;Long Term: Improve cardiorespiratory fitness, muscular endurance and strength as measured by increased METs and functional capacity (6MWT)       Able to understand and use rate of perceived exertion (RPE) scale Yes       Intervention Provide education and explanation on how to use RPE scale       Expected Outcomes Short Term: Able to use RPE daily in rehab to express subjective intensity level;Long Term:  Able to use RPE to guide intensity level when exercising independently       Able to understand and use Dyspnea scale Yes       Intervention Provide education and explanation on how to use Dyspnea scale       Expected Outcomes Short Term: Able  to use Dyspnea scale daily in rehab to express subjective sense of shortness of breath during exertion;Long Term: Able to use Dyspnea scale to guide intensity level when exercising independently       Knowledge and understanding of Target Heart Rate Range (THRR) Yes       Intervention Provide education and explanation of THRR including how the numbers were predicted and where they are located for reference       Expected Outcomes Short Term: Able to state/look up THRR;Short Term: Able to use daily as guideline for intensity in  rehab;Long Term: Able to use THRR to govern intensity when exercising independently       Able to check pulse independently Yes       Intervention Provide education and demonstration on how to check pulse in carotid and radial arteries.;Review the importance of being able to check your own pulse for safety during independent exercise       Expected Outcomes Short Term: Able to explain why pulse checking is important during independent exercise;Long Term: Able to check pulse independently and accurately       Understanding of Exercise Prescription Yes       Intervention Provide education, explanation, and written materials on patient's individual exercise prescription       Expected Outcomes Short Term: Able to explain program exercise prescription;Long Term: Able to explain home exercise prescription to exercise independently              Exercise Goals Re-Evaluation :   Discharge Exercise Prescription (Final Exercise Prescription Changes):   Nutrition:  Target Goals: Understanding of nutrition guidelines, daily intake of sodium <1537m, cholesterol <2027m calories 30% from fat and 7% or less from saturated fats, daily to have 5 or more servings of fruits and vegetables.  Education: Controlling Sodium/Reading Food Labels -Group verbal and written material supporting the discussion of sodium use in heart healthy nutrition. Review and explanation with models, verbal and written materials for utilization of the food label.   Education: General Nutrition Guidelines/Fats and Fiber: -Group instruction provided by verbal, written material, models and posters to present the general guidelines for heart healthy nutrition. Gives an explanation and review of dietary fats and fiber.   Biometrics:  Pre Biometrics - 11/15/19 1620      Pre Biometrics   Height 5' 6.5" (1.689 m)    Weight 211 lb 12.8 oz (96.1 kg)    BMI (Calculated) 33.68    Single Leg Stand 28.5 seconds             Nutrition Therapy Plan and Nutrition Goals:   Nutrition Assessments:  Nutrition Assessments - 11/15/19 1623      MEDFICTS Scores   Pre Score 36           MEDIFICTS Score Key:          ?70 Need to make dietary changes          40-70 Heart Healthy Diet         ? 40 Therapeutic Level Cholesterol Diet  Nutrition Goals Re-Evaluation:   Nutrition Goals Discharge (Final Nutrition Goals Re-Evaluation):   Psychosocial: Target Goals: Acknowledge presence or absence of significant depression and/or stress, maximize coping skills, provide positive support system. Participant is able to verbalize types and ability to use techniques and skills needed for reducing stress and depression.   Education: Depression - Provides group verbal and written instruction on the correlation between heart/lung disease and depressed mood, treatment options, and the stigmas  associated with seeking treatment.   Education: Sleep Hygiene -Provides group verbal and written instruction about how sleep can affect your health.  Define sleep hygiene, discuss sleep cycles and impact of sleep habits. Review good sleep hygiene tips.     Education: Stress and Anxiety: - Provides group verbal and written instruction about the health risks of elevated stress and causes of high stress.  Discuss the correlation between heart/lung disease and anxiety and treatment options. Review healthy ways to manage with stress and anxiety.    Initial Review & Psychosocial Screening:  Initial Psych Review & Screening - 11/11/19 0941      Initial Review   Current issues with None Identified      Family Dynamics   Good Support System? Yes    Comments He can look to his son, daughter and fiance for support. He feels positive about his health and wants to spend more time with his grandchildren.      Barriers   Psychosocial barriers to participate in program There are no identifiable barriers or psychosocial needs.;The  patient should benefit from training in stress management and relaxation.      Screening Interventions   Interventions Encouraged to exercise           Quality of Life Scores:   Quality of Life - 11/15/19 1622      Quality of Life   Select Quality of Life      Quality of Life Scores   Health/Function Pre 21.11 %    Socioeconomic Pre 28.8 %    Psych/Spiritual Pre 30 %    Family Pre 26.4 %    GLOBAL Pre 25.21 %          Scores of 19 and below usually indicate a poorer quality of life in these areas.  A difference of  2-3 points is a clinically meaningful difference.  A difference of 2-3 points in the total score of the Quality of Life Index has been associated with significant improvement in overall quality of life, self-image, physical symptoms, and general health in studies assessing change in quality of life.  PHQ-9: Recent Review Flowsheet Data    Depression screen [facility] 2/9 11/15/2019   Decreased Interest 1   Down, Depressed, Hopeless 0   PHQ - 2 Score 1   Altered sleeping 0   Tired, decreased energy 2   Change in appetite 0   Feeling bad or failure about yourself  0   Trouble concentrating 0   Moving slowly or fidgety/restless 0   Suicidal thoughts 0   PHQ-9 Score 3     Interpretation of Total Score  Total Score Depression Severity:  1-4 = Minimal depression, 5-9 = Mild depression, 10-14 = Moderate depression, 15-19 = Moderately severe depression, 20-27 = Severe depression   Psychosocial Evaluation and Intervention:  Psychosocial Evaluation - 11/11/19 0945      Psychosocial Evaluation & Interventions   Interventions Encouraged to exercise with the program and follow exercise prescription    Comments He can look to his son, daughter and fiance for support. He feels positive about his health and wants to spend more time with his grandchildren.    Expected Outcomes Short: Exercise regularly to support mental health and notify staff of any changes. Long: maintain  mental health and well being through teaching of rehab or prescribed medications independently.    Continue Psychosocial Services  Follow up required by staff           Psychosocial  Re-Evaluation:   Psychosocial Discharge (Final Psychosocial Re-Evaluation):   Vocational Rehabilitation: Provide vocational rehab assistance to qualifying candidates.   Vocational Rehab Evaluation & Intervention:   Education: Education Goals: Education classes will be provided on a variety of topics geared toward better understanding of heart health and risk factor modification. Participant will state understanding/return demonstration of topics presented as noted by education test scores.  Learning Barriers/Preferences:  Learning Barriers/Preferences - 11/11/19 0940      Learning Barriers/Preferences   Learning Barriers None    Learning Preferences None           General Cardiac Education Topics:  AED/CPR: - Group verbal and written instruction with the use of models to demonstrate the basic use of the AED with the basic ABC's of resuscitation.   Anatomy & Physiology of the Heart: - Group verbal and written instruction and models provide basic cardiac anatomy and physiology, with the coronary electrical and arterial systems. Review of Valvular disease and Heart Failure   Cardiac Procedures: - Group verbal and written instruction to review commonly prescribed medications for heart disease. Reviews the medication, class of the drug, and side effects. Includes the steps to properly store meds and maintain the prescription regimen. (beta blockers and nitrates)   Cardiac Medications I: - Group verbal and written instruction to review commonly prescribed medications for heart disease. Reviews the medication, class of the drug, and side effects. Includes the steps to properly store meds and maintain the prescription regimen.   Cardiac Medications II: -Group verbal and written instruction to  review commonly prescribed medications for heart disease. Reviews the medication, class of the drug, and side effects. (all other drug classes)    Go Sex-Intimacy & Heart Disease, Get SMART - Goal Setting: - Group verbal and written instruction through game format to discuss heart disease and the return to sexual intimacy. Provides group verbal and written material to discuss and apply goal setting through the application of the S.M.A.R.T. Method.   Other Matters of the Heart: - Provides group verbal, written materials and models to describe Stable Angina and Peripheral Artery. Includes description of the disease process and treatment options available to the cardiac patient.   Infection Prevention: - Provides verbal and written material to individual with discussion of infection control including proper hand washing and proper equipment cleaning during exercise session.   Cardiac Rehab from 11/11/2019 in [facility] Cardiac and Pulmonary Rehab  Date 11/11/19  Educator [facility]  Instruction Review Code 1- Verbalizes Understanding      Falls Prevention: - Provides verbal and written material to individual with discussion of falls prevention and safety.   Cardiac Rehab from 11/15/2019 in [facility] Cardiac and Pulmonary Rehab  Date 11/15/19  Educator AS  Instruction Review Code 1- Verbalizes Understanding      Other: -Provides group and verbal instruction on various topics (see comments)   Cardiac Rehab from 11/15/2019 in [facility] Cardiac and Pulmonary Rehab  Date 11/15/19  [facility] quit smoking packet]  Educator AS  Instruction Review Code 1- Verbalizes Understanding      Knowledge Questionnaire Score:  Knowledge Questionnaire Score - 11/15/19 1621      Knowledge Questionnaire Score   Pre Score 21/26 HF,exercise,nutrition           Core Components/Risk Factors/Patient Goals at Admission:  Personal Goals and Risk Factors at Admission - 11/15/19 1623      Core Components/Risk Factors/Patient  Goals on Admission    Weight Management Yes;Weight Loss    Intervention  Weight Management: Develop a combined nutrition and exercise program designed to reach desired caloric intake, while maintaining appropriate intake of nutrient and fiber, sodium and fats, and appropriate energy expenditure required for the weight goal.;Weight Management: Provide education and appropriate resources to help participant work on and attain dietary goals.;Weight Management/Obesity: Establish reasonable short term and long term weight goals.    Admit Weight 211 lb 12.8 oz (96.1 kg)    Goal Weight: Short Term 205 lb (93 kg)    Goal Weight: Long Term 200 lb (90.7 kg)    Expected Outcomes Short Term: Continue to assess and modify interventions until short term weight is achieved;Long Term: Adherence to nutrition and physical activity/exercise program aimed toward attainment of established weight goal;Weight Maintenance: Understanding of the daily nutrition guidelines, which includes 25-35% calories from fat, 7% or less cal from saturated fats, less than 220m cholesterol, less than 1.5gm of sodium, & 5 or more servings of fruits and vegetables daily;Weight Loss: Understanding of general recommendations for a balanced deficit meal plan, which promotes 1-2 lb weight loss per week and includes a negative energy balance of [phone] kcal/d;Understanding of distribution of calorie intake throughout the day with the consumption of 4-5 meals/snacks;Understanding recommendations for meals to include 15-35% energy as protein, 25-35% energy from fat, 35-60% energy from carbohydrates, less than 2081mof dietary cholesterol, 20-35 gm of total fiber daily    Tobacco Cessation Yes    Number of packs per day Half    Intervention Assist the participant in steps to quit. Provide individualized education and counseling about committing to Tobacco Cessation, relapse prevention, and pharmacological support that can be provided by physician.;OfShip brokerassist with locating and accessing local/national Quit Smoking programs, and support quit date choice.    Expected Outcomes Short Term: Will demonstrate readiness to quit, by selecting a quit date.;Short Term: Will quit all tobacco product use, adhering to prevention of relapse plan.;Long Term: Complete abstinence from all tobacco products for at least 12 months from quit date.    Diabetes Yes    Intervention Provide education about signs/symptoms and action to take for hypo/hyperglycemia.;Provide education about proper nutrition, including hydration, and aerobic/resistive exercise prescription along with prescribed medications to achieve blood glucose in normal ranges: Fasting glucose 65-99 mg/dL    Expected Outcomes Short Term: Participant verbalizes understanding of the signs/symptoms and immediate care of hyper/hypoglycemia, proper foot care and importance of medication, aerobic/resistive exercise and nutrition plan for blood glucose control.;Long Term: Attainment of HbA1C < 7%.    Hypertension Yes    Intervention Provide education on lifestyle modifcations including regular physical activity/exercise, weight management, moderate sodium restriction and increased consumption of fresh fruit, vegetables, and low fat dairy, alcohol moderation, and smoking cessation.;Monitor prescription use compliance.    Expected Outcomes Short Term: Continued assessment and intervention until BP is < 140/9082mG in hypertensive participants. < 130/38m64m in hypertensive participants with diabetes, heart failure or chronic kidney disease.;Long Term: Maintenance of blood pressure at goal levels.    Lipids Yes    Intervention Provide education and support for participant on nutrition & aerobic/resistive exercise along with prescribed medications to achieve LDL <70mg69mL >40mg.73mExpected Outcomes Short Term: Participant states understanding of desired cholesterol values and is compliant with  medications prescribed. Participant is following exercise prescription and nutrition guidelines.;Long Term: Cholesterol controlled with medications as prescribed, with individualized exercise RX and with personalized nutrition plan. Value goals: LDL < 70mg, 19m> 40 mg.  Education:Diabetes - Individual verbal and written instruction to review signs/symptoms of diabetes, desired ranges of glucose level fasting, after meals and with exercise. Acknowledge that pre and post exercise glucose checks will be done for 3 sessions at entry of program.   Cardiac Rehab from 11/15/2019 in [facility] Cardiac and Pulmonary Rehab  Date 11/15/19  Educator AS  Instruction Review Code 1- Verbalizes Understanding      Education: Know Your Numbers and Risk Factors: -Group verbal and written instruction about important numbers in your health.  Discussion of what are risk factors and how they play a role in the disease process.  Review of Cholesterol, Blood Pressure, Diabetes, and BMI and the role they play in your overall health.   Core Components/Risk Factors/Patient Goals Review:    Core Components/Risk Factors/Patient Goals at Discharge (Final Review):    ITP Comments:  ITP Comments    Row Name 11/11/19 0942           ITP Comments Virtual Visit completed. Patient informed on EP and RD appointment and 6 Minute walk test. Patient also informed of patient health questionnaires on My Chart. Patient Verbalizes understanding. Visit diagnosis can be found in [facility] 10/18/2019.              Comments: initial ITP

## 2019-11-15 NOTE — Telephone Encounter (Signed)
Patient walked in to office today to state that his short term disability insurance is needing further information regarding his "return to work date"/  [employer] Fax# [phone] [email or web address]  Please advise

## 2019-11-16 NOTE — Telephone Encounter (Signed)
We could not determine return to work date before given his continued angina.  I suspect that his angina should improve with cardiac rehab and adjusting his medications.  Return to work date is tentative on November 1.

## 2019-11-17 ENCOUNTER — Telehealth: Payer: Self-pay | Admitting: Cardiovascular Disease

## 2019-11-17 NOTE — Telephone Encounter (Signed)
Spoke with patient and reviewed that provider did state to discontinue medication for now. He verbalized understanding regarding these instructions. He also inquired about some paperwork that he dropped off that same day. He states that he was seeing Ignacia Bayley that day and also reviewed chart where he is Dr. Tyrell Antonio patient. Let him know that they were still pending review by provider. He verbalized understanding of our conversation, agreement with plan, and had no further questions at this time.

## 2019-11-17 NOTE — Telephone Encounter (Signed)
Ok to d/c.

## 2019-11-17 NOTE — Telephone Encounter (Signed)
Pt c/o medication issue:  1. Name of Medication: ranolazine    2. How are you currently taking this medication (dosage and times per day)? 500 MG by mouth 2 times daily  3. Are you having a reaction (difficulty breathing--STAT)? Dizzy, sick on stomach  4. What is your medication issue? Patient calling, states he does feel like he can continue this medication.  Please call to discuss.

## 2019-11-18 NOTE — Telephone Encounter (Signed)
Spoke with the patient. Patient sts that [employer] is wanting a copy of his most recent o/v with Ignacia Bayley, NP.  Not necessarily a return to work date.  Adv the patient that I will fax the rqst info over to them.  Patient voiced appreciation for the assistance.

## 2019-11-24 ENCOUNTER — Encounter: Payer: Self-pay | Admitting: *Deleted

## 2019-11-24 DIAGNOSIS — Z955 Presence of coronary angioplasty implant and graft: Secondary | ICD-10-CM

## 2019-11-24 NOTE — Progress Notes (Signed)
Cardiac Individual Treatment Plan  Patient Details  Name: [name] MRN: [id number] Date of Birth: [birth year] Referring Provider:     Cardiac Rehab from 11/15/2019 in [facility] Cardiac and Pulmonary Rehab  Referring Provider Arida      Initial Encounter Date:    Cardiac Rehab from 11/15/2019 in [facility] Cardiac and Pulmonary Rehab  Date 11/15/19      Visit Diagnosis: Status post coronary artery stent placement  Patient's Home Medications on Admission:  Current Outpatient Medications:  .  aspirin EC 81 MG tablet, Take 81 mg by mouth daily., Disp: , Rfl:  .  atorvastatin (LIPITOR) 80 MG tablet, Take 80 mg by mouth daily., Disp: , Rfl:  .  carvedilol (COREG) 3.125 MG tablet, Take 1 tablet (3.125 mg total) by mouth 2 (two) times daily with a meal., Disp: 180 tablet, Rfl: 3 .  Cinnamon 500 MG capsule, Take 1,000 mg by mouth daily., Disp: , Rfl:  .  clopidogrel (PLAVIX) 75 MG tablet, Take 75 mg by mouth daily., Disp: , Rfl:  .  EPINEPHrine 0.3 mg/0.3 mL IJ SOAJ injection, Inject 0.3 mg into the muscle as needed for anaphylaxis., Disp: , Rfl:  .  famotidine (PEPCID) 20 MG tablet, Take 20 mg by mouth daily., Disp: , Rfl:  .  glipiZIDE (GLUCOTROL) 10 MG tablet, Take 1.5 tablets (15 mg total) by mouth 2 (two) times daily before a meal., Disp: 270 tablet, Rfl: 1 .  glucose blood test strip, Use OneTouch Verio test strips as instructed to check blood sugar twice daily. DX:E11.65, Disp: 100 each, Rfl: 3 .  hydrochlorothiazide (HYDRODIURIL) 25 MG tablet, Take 1 tablet (25 mg total) by mouth daily., Disp: , Rfl:  .  isosorbide mononitrate (IMDUR) 60 MG 24 hr tablet, Take 1.5 tablets (90 mg total) by mouth daily., Disp: 45 tablet, Rfl: 5 .  metFORMIN (GLUCOPHAGE) 1000 MG tablet, Take 1 tablet (1,000 mg total) by mouth 2 (two) times daily with a meal. Restart on 10/02/19., Disp: 180 tablet, Rfl: 3 .  nitroGLYCERIN (NITROSTAT) 0.4 MG SL tablet, Place 0.4 mg under the tongue every 5 (five) minutes as needed  for chest pain. , Disp: , Rfl:  .  pioglitazone (ACTOS) 45 MG tablet, Take 1 tablet (45 mg total) by mouth daily., Disp: 90 tablet, Rfl: 1 .  vitamin B-12 (CYANOCOBALAMIN) 1000 MCG tablet, Take 1,000 mcg by mouth daily., Disp: , Rfl:   Past Medical History: Past Medical History:  Diagnosis Date  . CKD (chronic kidney disease), stage III   . Coronary artery disease    a.  H/o NSTEMI s/p LAD/RCA stenting; b. 08/2019 PCI: RCA 99p (2.5x15 Resolute Onyx DES), 75p/m, mild ISR; c. 09/2019 PCI: 85p - heavily calcified (intravascular lithotripsy & 3.0x26 Resolute Onyx DES); d. 10/18/19 Relook cath: LM 25, LAD 77mISR, 75d, 70/50d/apical, RI 80, LCX 50p/m/d, RCA 30p ISR, 270mSR, 50d, RPDA 60-->Med Rx.  . Diabetes mellitus without complication ([facility]  . Diastolic dysfunction    a. 08/2019 Echo: EF 50-55%, no rwma, Gr1 DD, nl RV size/fxn. Triv MR.  . Marland KitchenERD (gastroesophageal reflux disease)   . Hyperlipidemia   . Hypertension   . NSTEMI (non-ST elevated myocardial infarction) ([facility]/2021  . Sleep apnea   . Tobacco abuse     Tobacco Use: Social History   Tobacco Use  Smoking Status Current Every Day Smoker  . Packs/day: 0.50  . Years: 50.00  . Pack years: 25.00  . Types: Cigarettes  Smokeless Tobacco Never  Used  Tobacco Comment   Patient states he is ready to quit    Labs: Recent Review Flowsheet Data    Labs for ITP Cardiac and Pulmonary Rehab Latest Ref Rng & Units 08/31/2019 09/01/2019   Cholestrol 0 - 200 mg/dL - 227(H)   LDLCALC 0 - 99 mg/dL - UNABLE TO CALCULATE IF TRIGLYCERIDE OVER 400 mg/dL   LDLDIRECT 0 - 99 mg/dL - 71.5   HDL >40 mg/dL - 34(L)   Trlycerides <150 mg/dL - 755(H)   Hemoglobin A1c 4.8 - 5.6 % 12.1(H) -       Exercise Target Goals: Exercise Program Goal: Individual exercise prescription set using results from initial 6 min walk test and THRR while considering  patient's activity barriers and safety.   Exercise Prescription Goal: Initial exercise prescription  builds to 30-45 minutes a day of aerobic activity, 2-3 days per week.  Home exercise guidelines will be given to patient during program as part of exercise prescription that the participant will acknowledge.   Education: Aerobic Exercise & Resistance Training: - Gives group verbal and written instruction on the various components of exercise. Focuses on aerobic and resistive training programs and the benefits of this training and how to safely progress through these programs..   Education: Exercise & Equipment Safety: - Individual verbal instruction and demonstration of equipment use and safety with use of the equipment.   Cardiac Rehab from 11/15/2019 in [facility] Cardiac and Pulmonary Rehab  Date 11/15/19  Educator AS  Instruction Review Code 1- Verbalizes Understanding      Education: Exercise Physiology & General Exercise Guidelines: - Group verbal and written instruction with models to review the exercise physiology of the cardiovascular system and associated critical values. Provides general exercise guidelines with specific guidelines to those with heart or lung disease.    Education: Flexibility, Balance, Mind/Body Relaxation: Provides group verbal/written instruction on the benefits of flexibility and balance training, including mind/body exercise modes such as yoga, pilates and tai chi.  Demonstration and skill practice provided.   Activity Barriers & Risk Stratification:   6 Minute Walk:  6 Minute Walk    Row Name 11/15/19 1610         6 Minute Walk   Phase Initial     Distance 975 feet     Walk Time 6 minutes     # of Rest Breaks 0     MPH 1.85     METS 2.1     RPE 11     Perceived Dyspnea  1     VO2 Peak 7.4     Symptoms Yes (comment)     Comments hip pain 3/10     Resting HR 67 bpm     Resting BP 100/68     Resting Oxygen Saturation  97 %     Exercise Oxygen Saturation  during 6 min walk 98 %     Max Ex. HR 76 bpm     Max Ex. BP 112/56     2 Minute Post BP  92/56            Oxygen Initial Assessment:   Oxygen Re-Evaluation:   Oxygen Discharge (Final Oxygen Re-Evaluation):   Initial Exercise Prescription:  Initial Exercise Prescription - 11/15/19 1600      Date of Initial Exercise RX and Referring Provider   Date 11/15/19    Referring Provider Arida      Treadmill   MPH 1    Grade 0  Minutes 15    METs 2      Recumbant Bike   Level 1    RPM 60    Minutes 15    METs 2      NuStep   Level 1    SPM 80    Minutes 15    METs 2      REL-XR   Level 1    Speed 50    Minutes 15    METs 2      T5 Nustep   Level 1    SPM 80    Minutes 15    METs 2      Prescription Details   Frequency (times per week) 3    Duration Progress to 30 minutes of continuous aerobic without signs/symptoms of physical distress      Intensity   THRR 40-80% of Max Heartrate 104-140    Ratings of Perceived Exertion 11-13    Perceived Dyspnea 0-4      Progression   Progression Continue to progress workloads to maintain intensity without signs/symptoms of physical distress.      Resistance Training   Training Prescription Yes    Weight 3 lb    Reps 10-15           Perform Capillary Blood Glucose checks as needed.  Exercise Prescription Changes:   Exercise Comments:   Exercise Goals and Review:  Exercise Goals    Row Name 11/15/19 1619             Exercise Goals   Increase Physical Activity Yes       Intervention Provide advice, education, support and counseling about physical activity/exercise needs.;Develop an individualized exercise prescription for aerobic and resistive training based on initial evaluation findings, risk stratification, comorbidities and participant's personal goals.       Expected Outcomes Short Term: Attend rehab on a regular basis to increase amount of physical activity.;Long Term: Add in home exercise to make exercise part of routine and to increase amount of physical activity.;Long Term:  Exercising regularly at least 3-5 days a week.       Increase Strength and Stamina Yes       Intervention Provide advice, education, support and counseling about physical activity/exercise needs.;Develop an individualized exercise prescription for aerobic and resistive training based on initial evaluation findings, risk stratification, comorbidities and participant's personal goals.       Expected Outcomes Short Term: Increase workloads from initial exercise prescription for resistance, speed, and METs.;Short Term: Perform resistance training exercises routinely during rehab and add in resistance training at home;Long Term: Improve cardiorespiratory fitness, muscular endurance and strength as measured by increased METs and functional capacity (6MWT)       Able to understand and use rate of perceived exertion (RPE) scale Yes       Intervention Provide education and explanation on how to use RPE scale       Expected Outcomes Short Term: Able to use RPE daily in rehab to express subjective intensity level;Long Term:  Able to use RPE to guide intensity level when exercising independently       Able to understand and use Dyspnea scale Yes       Intervention Provide education and explanation on how to use Dyspnea scale       Expected Outcomes Short Term: Able to use Dyspnea scale daily in rehab to express subjective sense of shortness of breath during exertion;Long Term: Able to use Dyspnea scale to guide intensity  level when exercising independently       Knowledge and understanding of Target Heart Rate Range (THRR) Yes       Intervention Provide education and explanation of THRR including how the numbers were predicted and where they are located for reference       Expected Outcomes Short Term: Able to state/look up THRR;Short Term: Able to use daily as guideline for intensity in rehab;Long Term: Able to use THRR to govern intensity when exercising independently       Able to check pulse independently Yes        Intervention Provide education and demonstration on how to check pulse in carotid and radial arteries.;Review the importance of being able to check your own pulse for safety during independent exercise       Expected Outcomes Short Term: Able to explain why pulse checking is important during independent exercise;Long Term: Able to check pulse independently and accurately       Understanding of Exercise Prescription Yes       Intervention Provide education, explanation, and written materials on patient's individual exercise prescription       Expected Outcomes Short Term: Able to explain program exercise prescription;Long Term: Able to explain home exercise prescription to exercise independently              Exercise Goals Re-Evaluation :   Discharge Exercise Prescription (Final Exercise Prescription Changes):   Nutrition:  Target Goals: Understanding of nutrition guidelines, daily intake of sodium <1580m, cholesterol <2057m calories 30% from fat and 7% or less from saturated fats, daily to have 5 or more servings of fruits and vegetables.  Education: Controlling Sodium/Reading Food Labels -Group verbal and written material supporting the discussion of sodium use in heart healthy nutrition. Review and explanation with models, verbal and written materials for utilization of the food label.   Education: General Nutrition Guidelines/Fats and Fiber: -Group instruction provided by verbal, written material, models and posters to present the general guidelines for heart healthy nutrition. Gives an explanation and review of dietary fats and fiber.   Biometrics:  Pre Biometrics - 11/15/19 1620      Pre Biometrics   Height 5' 6.5" (1.689 m)    Weight 211 lb 12.8 oz (96.1 kg)    BMI (Calculated) 33.68    Single Leg Stand 28.5 seconds            Nutrition Therapy Plan and Nutrition Goals:   Nutrition Assessments:  Nutrition Assessments - 11/15/19 1623      MEDFICTS Scores    Pre Score 36           MEDIFICTS Score Key:          ?70 Need to make dietary changes          40-70 Heart Healthy Diet         ? 40 Therapeutic Level Cholesterol Diet  Nutrition Goals Re-Evaluation:   Nutrition Goals Discharge (Final Nutrition Goals Re-Evaluation):   Psychosocial: Target Goals: Acknowledge presence or absence of significant depression and/or stress, maximize coping skills, provide positive support system. Participant is able to verbalize types and ability to use techniques and skills needed for reducing stress and depression.   Education: Depression - Provides group verbal and written instruction on the correlation between heart/lung disease and depressed mood, treatment options, and the stigmas associated with seeking treatment.   Education: Sleep Hygiene -Provides group verbal and written instruction about how sleep can affect your health.  Define sleep hygiene,  discuss sleep cycles and impact of sleep habits. Review good sleep hygiene tips.     Education: Stress and Anxiety: - Provides group verbal and written instruction about the health risks of elevated stress and causes of high stress.  Discuss the correlation between heart/lung disease and anxiety and treatment options. Review healthy ways to manage with stress and anxiety.    Initial Review & Psychosocial Screening:  Initial Psych Review & Screening - 11/11/19 0941      Initial Review   Current issues with None Identified      Family Dynamics   Good Support System? Yes    Comments He can look to his son, daughter and fiance for support. He feels positive about his health and wants to spend more time with his grandchildren.      Barriers   Psychosocial barriers to participate in program There are no identifiable barriers or psychosocial needs.;The patient should benefit from training in stress management and relaxation.      Screening Interventions   Interventions Encouraged to exercise            Quality of Life Scores:   Quality of Life - 11/15/19 1622      Quality of Life   Select Quality of Life      Quality of Life Scores   Health/Function Pre 21.11 %    Socioeconomic Pre 28.8 %    Psych/Spiritual Pre 30 %    Family Pre 26.4 %    GLOBAL Pre 25.21 %          Scores of 19 and below usually indicate a poorer quality of life in these areas.  A difference of  2-3 points is a clinically meaningful difference.  A difference of 2-3 points in the total score of the Quality of Life Index has been associated with significant improvement in overall quality of life, self-image, physical symptoms, and general health in studies assessing change in quality of life.  PHQ-9: Recent Review Flowsheet Data    Depression screen [facility] 2/9 11/15/2019   Decreased Interest 1   Down, Depressed, Hopeless 0   PHQ - 2 Score 1   Altered sleeping 0   Tired, decreased energy 2   Change in appetite 0   Feeling bad or failure about yourself  0   Trouble concentrating 0   Moving slowly or fidgety/restless 0   Suicidal thoughts 0   PHQ-9 Score 3     Interpretation of Total Score  Total Score Depression Severity:  1-4 = Minimal depression, 5-9 = Mild depression, 10-14 = Moderate depression, 15-19 = Moderately severe depression, 20-27 = Severe depression   Psychosocial Evaluation and Intervention:  Psychosocial Evaluation - 11/11/19 0945      Psychosocial Evaluation & Interventions   Interventions Encouraged to exercise with the program and follow exercise prescription    Comments He can look to his son, daughter and fiance for support. He feels positive about his health and wants to spend more time with his grandchildren.    Expected Outcomes Short: Exercise regularly to support mental health and notify staff of any changes. Long: maintain mental health and well being through teaching of rehab or prescribed medications independently.    Continue Psychosocial Services  Follow up required by  staff           Psychosocial Re-Evaluation:   Psychosocial Discharge (Final Psychosocial Re-Evaluation):   Vocational Rehabilitation: Provide vocational rehab assistance to qualifying candidates.   Vocational Rehab Evaluation & Intervention:  Education: Education Goals: Education classes will be provided on a variety of topics geared toward better understanding of heart health and risk factor modification. Participant will state understanding/return demonstration of topics presented as noted by education test scores.  Learning Barriers/Preferences:  Learning Barriers/Preferences - 11/11/19 0940      Learning Barriers/Preferences   Learning Barriers None    Learning Preferences None           General Cardiac Education Topics:  AED/CPR: - Group verbal and written instruction with the use of models to demonstrate the basic use of the AED with the basic ABC's of resuscitation.   Anatomy & Physiology of the Heart: - Group verbal and written instruction and models provide basic cardiac anatomy and physiology, with the coronary electrical and arterial systems. Review of Valvular disease and Heart Failure   Cardiac Procedures: - Group verbal and written instruction to review commonly prescribed medications for heart disease. Reviews the medication, class of the drug, and side effects. Includes the steps to properly store meds and maintain the prescription regimen. (beta blockers and nitrates)   Cardiac Medications I: - Group verbal and written instruction to review commonly prescribed medications for heart disease. Reviews the medication, class of the drug, and side effects. Includes the steps to properly store meds and maintain the prescription regimen.   Cardiac Medications II: -Group verbal and written instruction to review commonly prescribed medications for heart disease. Reviews the medication, class of the drug, and side effects. (all other drug classes)    Go  Sex-Intimacy & Heart Disease, Get SMART - Goal Setting: - Group verbal and written instruction through game format to discuss heart disease and the return to sexual intimacy. Provides group verbal and written material to discuss and apply goal setting through the application of the S.M.A.R.T. Method.   Other Matters of the Heart: - Provides group verbal, written materials and models to describe Stable Angina and Peripheral Artery. Includes description of the disease process and treatment options available to the cardiac patient.   Infection Prevention: - Provides verbal and written material to individual with discussion of infection control including proper hand washing and proper equipment cleaning during exercise session.   Cardiac Rehab from 11/11/2019 in [facility] Cardiac and Pulmonary Rehab  Date 11/11/19  Educator [facility]  Instruction Review Code 1- Verbalizes Understanding      Falls Prevention: - Provides verbal and written material to individual with discussion of falls prevention and safety.   Cardiac Rehab from 11/15/2019 in [facility] Cardiac and Pulmonary Rehab  Date 11/15/19  Educator AS  Instruction Review Code 1- Verbalizes Understanding      Other: -Provides group and verbal instruction on various topics (see comments)   Cardiac Rehab from 11/15/2019 in [facility] Cardiac and Pulmonary Rehab  Date 11/15/19  [facility] quit smoking packet]  Educator AS  Instruction Review Code 1- Verbalizes Understanding      Knowledge Questionnaire Score:  Knowledge Questionnaire Score - 11/15/19 1621      Knowledge Questionnaire Score   Pre Score 21/26 HF,exercise,nutrition           Core Components/Risk Factors/Patient Goals at Admission:  Personal Goals and Risk Factors at Admission - 11/15/19 1623      Core Components/Risk Factors/Patient Goals on Admission    Weight Management Yes;Weight Loss    Intervention Weight Management: Develop a combined nutrition and exercise program designed to  reach desired caloric intake, while maintaining appropriate intake of nutrient and fiber, sodium and fats, and appropriate  energy expenditure required for the weight goal.;Weight Management: Provide education and appropriate resources to help participant work on and attain dietary goals.;Weight Management/Obesity: Establish reasonable short term and long term weight goals.    Admit Weight 211 lb 12.8 oz (96.1 kg)    Goal Weight: Short Term 205 lb (93 kg)    Goal Weight: Long Term 200 lb (90.7 kg)    Expected Outcomes Short Term: Continue to assess and modify interventions until short term weight is achieved;Long Term: Adherence to nutrition and physical activity/exercise program aimed toward attainment of established weight goal;Weight Maintenance: Understanding of the daily nutrition guidelines, which includes 25-35% calories from fat, 7% or less cal from saturated fats, less than 246m cholesterol, less than 1.5gm of sodium, & 5 or more servings of fruits and vegetables daily;Weight Loss: Understanding of general recommendations for a balanced deficit meal plan, which promotes 1-2 lb weight loss per week and includes a negative energy balance of [phone] kcal/d;Understanding of distribution of calorie intake throughout the day with the consumption of 4-5 meals/snacks;Understanding recommendations for meals to include 15-35% energy as protein, 25-35% energy from fat, 35-60% energy from carbohydrates, less than 2014mof dietary cholesterol, 20-35 gm of total fiber daily    Tobacco Cessation Yes    Number of packs per day Half    Intervention Assist the participant in steps to quit. Provide individualized education and counseling about committing to Tobacco Cessation, relapse prevention, and pharmacological support that can be provided by physician.;OfAdvice workerassist with locating and accessing local/national Quit Smoking programs, and support quit date choice.    Expected Outcomes Short  Term: Will demonstrate readiness to quit, by selecting a quit date.;Short Term: Will quit all tobacco product use, adhering to prevention of relapse plan.;Long Term: Complete abstinence from all tobacco products for at least 12 months from quit date.    Diabetes Yes    Intervention Provide education about signs/symptoms and action to take for hypo/hyperglycemia.;Provide education about proper nutrition, including hydration, and aerobic/resistive exercise prescription along with prescribed medications to achieve blood glucose in normal ranges: Fasting glucose 65-99 mg/dL    Expected Outcomes Short Term: Participant verbalizes understanding of the signs/symptoms and immediate care of hyper/hypoglycemia, proper foot care and importance of medication, aerobic/resistive exercise and nutrition plan for blood glucose control.;Long Term: Attainment of HbA1C < 7%.    Hypertension Yes    Intervention Provide education on lifestyle modifcations including regular physical activity/exercise, weight management, moderate sodium restriction and increased consumption of fresh fruit, vegetables, and low fat dairy, alcohol moderation, and smoking cessation.;Monitor prescription use compliance.    Expected Outcomes Short Term: Continued assessment and intervention until BP is < 140/9064mG in hypertensive participants. < 130/70m55m in hypertensive participants with diabetes, heart failure or chronic kidney disease.;Long Term: Maintenance of blood pressure at goal levels.    Lipids Yes    Intervention Provide education and support for participant on nutrition & aerobic/resistive exercise along with prescribed medications to achieve LDL <70mg38mL >40mg.85mExpected Outcomes Short Term: Participant states understanding of desired cholesterol values and is compliant with medications prescribed. Participant is following exercise prescription and nutrition guidelines.;Long Term: Cholesterol controlled with medications as  prescribed, with individualized exercise RX and with personalized nutrition plan. Value goals: LDL < 70mg, 88m> 40 mg.           Education:Diabetes - Individual verbal and written instruction to review signs/symptoms of diabetes, desired ranges of glucose level fasting, after meals  and with exercise. Acknowledge that pre and post exercise glucose checks will be done for 3 sessions at entry of program.   Cardiac Rehab from 11/15/2019 in [facility] Cardiac and Pulmonary Rehab  Date 11/15/19  Educator AS  Instruction Review Code 1- Verbalizes Understanding      Education: Know Your Numbers and Risk Factors: -Group verbal and written instruction about important numbers in your health.  Discussion of what are risk factors and how they play a role in the disease process.  Review of Cholesterol, Blood Pressure, Diabetes, and BMI and the role they play in your overall health.   Core Components/Risk Factors/Patient Goals Review:    Core Components/Risk Factors/Patient Goals at Discharge (Final Review):    ITP Comments:  ITP Comments    Row Name 11/11/19 0942 11/15/19 1629 11/24/19 0559       ITP Comments Virtual Visit completed. Patient informed on EP and RD appointment and 6 Minute walk test. Patient also informed of patient health questionnaires on My Chart. Patient Verbalizes understanding. Visit diagnosis can be found in [facility] 10/18/2019. Completed 6MWT and gym orientation. Initial ITP created and sent for review to Dr. Emily Filbert, Medical Director. 30 Day review completed. Medical Director ITP review done, changes made as directed, and signed approval by Medical Director.            Comments:

## 2019-11-30 ENCOUNTER — Encounter: Payer: 59 | Attending: Cardiovascular Disease | Admitting: *Deleted

## 2019-11-30 ENCOUNTER — Other Ambulatory Visit: Payer: Self-pay

## 2019-11-30 DIAGNOSIS — Z955 Presence of coronary angioplasty implant and graft: Secondary | ICD-10-CM | POA: Diagnosis present

## 2019-11-30 LAB — GLUCOSE, CAPILLARY
Glucose-Capillary: 185 mg/dL — ABNORMAL HIGH (ref 70–99)
Glucose-Capillary: 252 mg/dL — ABNORMAL HIGH (ref 70–99)

## 2019-11-30 NOTE — Progress Notes (Signed)
Daily Session Note  Patient Details  Name: [name] MRN: [id number] Date of Birth: 09-Sep-1960 Referring Provider:     Cardiac Rehab from 11/15/2019 in [facility]  Referring Provider Fletcher Anon      Encounter Date: 11/30/2019  Check In:      Social History   Tobacco Use  Smoking Status Current Every Day Smoker  . Packs/day: 0.50  . Years: 50.00  . Pack years: 25.00  . Types: Cigarettes  Smokeless Tobacco Never Used  Tobacco Comment   Patient states he is ready to quit    Goals Met:  Exercise tolerated well Personal goals reviewed No report of cardiac concerns or symptoms  Goals Unmet:  Not Applicable  Comments: First full day of exercise!  Patient was oriented to gym and equipment including functions, settings, policies, and procedures.  Patient's individual exercise prescription and treatment plan were reviewed.  All starting workloads were established based on the results of the 6 minute walk test done at initial orientation visit.  The plan for exercise progression was also introduced and progression will be customized based on patient's performance and goals.    Dr. Emily Filbert is Medical Director for [place] and [facility].

## 2019-12-01 ENCOUNTER — Telehealth: Payer: Self-pay | Admitting: Cardiovascular Disease

## 2019-12-01 NOTE — Telephone Encounter (Signed)
Received records request [employer] via fax, called patient and left a detailed message regarding payment requirement in order to start the process for form completion. Forms will be held in front office binder labeled "Patient Form Requests" until payment is received.

## 2019-12-02 ENCOUNTER — Encounter: Payer: 59 | Admitting: *Deleted

## 2019-12-02 ENCOUNTER — Other Ambulatory Visit: Payer: Self-pay

## 2019-12-02 DIAGNOSIS — Z0279 Encounter for issue of other medical certificate: Secondary | ICD-10-CM

## 2019-12-02 DIAGNOSIS — Z955 Presence of coronary angioplasty implant and graft: Secondary | ICD-10-CM

## 2019-12-02 LAB — GLUCOSE, CAPILLARY
Glucose-Capillary: 147 mg/dL — ABNORMAL HIGH (ref 70–99)
Glucose-Capillary: 145 mg/dL — ABNORMAL HIGH (ref 70–99)

## 2019-12-02 NOTE — Telephone Encounter (Signed)
Forms and money received [employer] forms placed in the nurse box [place] 10/14

## 2019-12-02 NOTE — Progress Notes (Signed)
Daily Session Note  Patient Details  Name: [name] MRN: [id number] Date of Birth: 08-22-1960 Referring Provider:     Cardiac Rehab from 11/15/2019 in [facility] and [facility] Re[facility]  Referring Provider Fletcher Anon      Encounter Date: 12/02/2019  Check In:      Social History   Tobacco Use  Smoking Status Current Every Day Smoker  . Packs/day: 0.50  . Years: 50.00  . Pack years: 25.00  . Types: Cigarettes  Smokeless Tobacco Never Used  Tobacco Comment   Patient states he is ready to quit    Goals Met:  Independence with exercise equipment Exercise tolerated well No report of cardiac concerns or symptoms  Goals Unmet:  Not Applicable  Comments: Pt able to follow exercise prescription today without complaint.  Will continue to monitor for progression.    Dr. Emily Filbert is Medical Director for [place] and [facility].

## 2019-12-03 NOTE — Telephone Encounter (Signed)
Per Pam, RN- forms have been obtained from nurse bin and been placed on Dr. Tyrell Antonio desk.

## 2019-12-06 ENCOUNTER — Telehealth: Payer: Self-pay

## 2019-12-06 NOTE — Telephone Encounter (Signed)
Spoke with [name] about attending 3 days per week (TTh F) - explained he will complete program sooner with 3 days attendance - he will let us know by Thursday this week.

## 2019-12-07 ENCOUNTER — Other Ambulatory Visit: Payer: Self-pay

## 2019-12-07 ENCOUNTER — Telehealth: Payer: Self-pay | Admitting: Cardiovascular Disease

## 2019-12-07 ENCOUNTER — Encounter: Payer: 59 | Admitting: *Deleted

## 2019-12-07 DIAGNOSIS — Z955 Presence of coronary angioplasty implant and graft: Secondary | ICD-10-CM

## 2019-12-07 NOTE — Progress Notes (Signed)
Daily Session Note  Patient Details  Name: [name] MRN: [id number] Date of Birth: [birth year] Referring Provider:     Cardiac Rehab from 11/15/2019 in [facility] Cardiac and Pulmonary Rehab  Referring Provider Fletcher Anon      Encounter Date: 12/07/2019  Check In:  Session Check In - 12/07/19 1022      Check-In   Supervising physician immediately available to respond to emergencies See telemetry face sheet for immediately available ER MD    Location [facility]-Cardiac & Pulmonary Rehab    Staff Present Heath Lark, RN, BSN, Jacklynn Bue, MS Exercise Physiologist;Amanda Oletta Darter, [place], ACSM CEP, Exercise Physiologist    Virtual Visit No    Medication changes reported     No    Fall or balance concerns reported    No    Warm-up and Cool-down Performed on first and last piece of equipment    Resistance Training Performed Yes    VAD Patient? No    PAD/SET Patient? No      Pain Assessment   Currently in Pain? No/denies              Social History   Tobacco Use  Smoking Status Current Every Day Smoker   Packs/day: 0.50   Years: 50.00   Pack years: 25.00   Types: Cigarettes  Smokeless Tobacco Never Used  Tobacco Comment   Patient states he is ready to quit    Goals Met:  Independence with exercise equipment Exercise tolerated well No report of cardiac concerns or symptoms  Goals Unmet:  Not Applicable  Comments: Pt able to follow exercise prescription today without complaint.  Will continue to monitor for progression.    Dr. Emily Filbert is Medical Director for [place] [facility].

## 2019-12-09 ENCOUNTER — Other Ambulatory Visit: Payer: Self-pay

## 2019-12-09 ENCOUNTER — Telehealth: Payer: Self-pay | Admitting: Cardiovascular Disease

## 2019-12-09 DIAGNOSIS — Z955 Presence of coronary angioplasty implant and graft: Secondary | ICD-10-CM | POA: Diagnosis not present

## 2019-12-09 LAB — GLUCOSE, CAPILLARY: Glucose-Capillary: 338 mg/dL — ABNORMAL HIGH (ref 70–99)

## 2019-12-09 NOTE — Telephone Encounter (Signed)
Spoke with the patient.  Patient sts that the [place] [employer] are wanting a return to work date. Patient sts that he is still having anginal symptoms and uncontrolled blood sugars.  His short term disability will max out at 26 weeks. He does not think he will be able to return to work indefinitely.  Adv the patient that on the most recent STD form he dropped off last week Dr. Fletcher Anon could indicate what he thinks is appropriate about his work status.  Adv the patient that he will be notified when the from has been completed.

## 2019-12-09 NOTE — Progress Notes (Signed)
Incomplete Session Note  Patient Details  Name: [name] MRN: [id number] Date of Birth: [birth year] Referring Provider:     Cardiac Rehab from 11/15/2019 in [facility] Card[facility] Rehab  Referring Provider Pinch did not complete his rehab session.  Blood sugar >300. [name], RD reviewed meds and diet recommendations to control blood sugar. Note sent to MD.

## 2019-12-09 NOTE — Telephone Encounter (Signed)
Patient calling to discuss a personal matter with Harriette Bouillon .

## 2019-12-09 NOTE — Progress Notes (Signed)
[name] had to be sent home from this Cardiac Rehab session because his Blood sugar was over 300. He states he did not check his blood sugar this AM and had Honey Nut Cheerios for breakfast.  He was given Hyper and Hypo glycemia information and a reminder to check his blood sugar in AM before he eats,especially on his Cardiac Rehab mornings.   We do check blood sugar the first three visits a pre and post.  He has ranged Pre 147,185 and today 338 POST Blood sugar readings 145 ad 252.     We will continue to check his blood sugar for a few more visits.  He must be within 110-300mg /Dl to exercise and leaving must be above 80mg /Dl.

## 2019-12-13 ENCOUNTER — Other Ambulatory Visit: Payer: Self-pay

## 2019-12-13 DIAGNOSIS — Z955 Presence of coronary angioplasty implant and graft: Secondary | ICD-10-CM

## 2019-12-13 NOTE — Progress Notes (Signed)
Completed initial RD evaluation 

## 2019-12-14 ENCOUNTER — Encounter: Payer: 59 | Admitting: *Deleted

## 2019-12-14 ENCOUNTER — Other Ambulatory Visit: Payer: Self-pay

## 2019-12-14 DIAGNOSIS — Z955 Presence of coronary angioplasty implant and graft: Secondary | ICD-10-CM

## 2019-12-14 LAB — GLUCOSE, CAPILLARY
Glucose-Capillary: 221 mg/dL — ABNORMAL HIGH (ref 70–99)
Glucose-Capillary: 231 mg/dL — ABNORMAL HIGH (ref 70–99)

## 2019-12-14 NOTE — Progress Notes (Signed)
Daily Session Note  Patient Details  Name: [name] MRN: [id number] Date of Birth: 11-13-1960 Referring Provider:     Cardiac Rehab from 11/15/2019 in [facility] Card[facility] and Pulmonary Rehab  Referring Provider Fletcher Anon      Encounter Date: 12/14/2019  Check In:  Session Check In - 12/14/19 0919      Check-In   Supervising physician immediately available to respond to emergencies See telemetry face sheet for immediately available ER MD    Location [facility]-Cardiac & Pulmonary Rehab    Staff Present Renita Papa, RN BSN;[name] Caiola RDN, Tawanna Solo, MS Exercise Physiologist    Virtual Visit No    Medication changes reported     No    Fall or balance concerns reported    No    Tobacco Cessation Use Decreased    Current number of cigarettes/nicotine per day     3    Warm-up and Cool-down Performed on first and last piece of equipment    Resistance Training Performed Yes    VAD Patient? No    PAD/SET Patient? No      Pain Assessment   Currently in Pain? No/denies              Social History   Tobacco Use  Smoking Status Current Every Day Smoker  . Packs/day: 0.50  . Years: 50.00  . Pack years: 25.00  . Types: Cigarettes  Smokeless Tobacco Never Used  Tobacco Comment   Patient states he is ready to quit    Goals Met:  Independence with exercise equipment Exercise tolerated well No report of cardiac concerns or symptoms Strength training completed today  Goals Unmet:  Not Applicable  Comments: Pt able to follow exercise prescription today without complaint.  Will continue to monitor for progression.    Dr. Emily Filbert is Medical Director for [place] [facility].

## 2019-12-16 ENCOUNTER — Other Ambulatory Visit: Payer: Self-pay

## 2019-12-16 DIAGNOSIS — Z955 Presence of coronary angioplasty implant and graft: Secondary | ICD-10-CM

## 2019-12-16 LAB — GLUCOSE, CAPILLARY: Glucose-Capillary: 204 mg/dL — ABNORMAL HIGH (ref 70–99)

## 2019-12-16 NOTE — Progress Notes (Signed)
Daily Session Note  Patient Details  Name: [name] MRN: [id number] Date of Birth: [birth year] Referring Provider:     Cardiac Rehab from 11/15/2019 in [facility] Cardiac and Pulmonary Rehab  Referring Provider Fletcher Anon      Encounter Date: 12/16/2019  Check In:  Session Check In - 12/16/19 0919      Check-In   Supervising physician immediately available to respond to emergencies See telemetry face sheet for immediately available ER MD    Location [facility]-Cardiac & Pulmonary Rehab    Staff Present Hope Budds RDN, Luther Redo, MPA, RN;Jessica Gray, MA, RCEP, CCRP, CCET;Amanda Sommer, BA, ACSM CEP, Exercise Physiologist    Virtual Visit No    Medication changes reported     No    Fall or balance concerns reported    No    Warm-up and Cool-down Performed on first and last piece of equipment    Resistance Training Performed Yes    VAD Patient? No    PAD/SET Patient? No      Pain Assessment   Currently in Pain? No/denies              Social History   Tobacco Use  Smoking Status Current Every Day Smoker  . Packs/day: 0.50  . Years: 50.00  . Pack years: 25.00  . Types: Cigarettes  Smokeless Tobacco Never Used  Tobacco Comment   Patient states he is ready to quit    Goals Met:  Independence with exercise equipment Exercise tolerated well No report of cardiac concerns or symptoms Strength training completed today  Goals Unmet:  Not Applicable  Comments: Pt able to follow exercise prescription today without complaint.  Will continue to monitor for progression.    Dr. Emily Filbert is Medical Director for [place] and [facility].

## 2019-12-16 NOTE — Telephone Encounter (Signed)
Patient calling to check on status of forms  °

## 2019-12-16 NOTE — Telephone Encounter (Signed)
Patient called to check status of forms. Reminded there is a 7-14 business guarantee, 11/2 will be the last business day in this window

## 2019-12-16 NOTE — Telephone Encounter (Signed)
Duplicate encounter see 12/01/19 telephone encounter.

## 2019-12-17 NOTE — Telephone Encounter (Addendum)
Spoke with the patient. Adv him of Dr. Tyrell Antonio response below. Patients sts that he thinks it would be best if he is listed as permanently disabled and unable to return to work.  Adv the patient that I will fwd the update to Dr. Fletcher Anon and call back once the form is completed and ready for pick up.

## 2019-12-17 NOTE — Telephone Encounter (Signed)
Okay what does he want me to say on these papers regarding his return to work date?  I am not going to keep doing this every few months.  We can say that he is permanently disabled if he continues to have uncontrolled angina and he can try to apply for permanent disability.

## 2019-12-20 NOTE — Telephone Encounter (Signed)
Duplicate encounter see 12/09/19 telephone encounter for documentation.

## 2019-12-20 NOTE — Telephone Encounter (Addendum)
Spoke with the patient. Adv him that the STD form (attending physicins statement) has been completed by Dr. Fletcher Anon and is ready to be picked up. Patient rqst the form be faxed directly to [employer]. Fax confirmation received.

## 2019-12-22 ENCOUNTER — Encounter: Payer: Self-pay | Admitting: *Deleted

## 2019-12-22 DIAGNOSIS — Z955 Presence of coronary angioplasty implant and graft: Secondary | ICD-10-CM

## 2019-12-22 NOTE — Progress Notes (Signed)
Cardiac Individual Treatment Plan  Patient Details  Name: [name] MRN: [id number] Date of Birth: Feb 08, 1961 Referring Provider:     Cardiac Rehab from 11/15/2019 in [facility] Cardiac and Pulmonary Rehab  Referring Provider Arida      Initial Encounter Date:    Cardiac Rehab from 11/15/2019 in [facility] Cardiac and Pulmonary Rehab  Date 11/15/19      Visit Diagnosis: Status post coronary artery stent placement  Patient's Home Medications on Admission:  Current Outpatient Medications:  .  aspirin EC 81 MG tablet, Take 81 mg by mouth daily., Disp: , Rfl:  .  atorvastatin (LIPITOR) 80 MG tablet, Take 80 mg by mouth daily., Disp: , Rfl:  .  carvedilol (COREG) 3.125 MG tablet, Take 1 tablet (3.125 mg total) by mouth 2 (two) times daily with a meal., Disp: 180 tablet, Rfl: 3 .  Cinnamon 500 MG capsule, Take 1,000 mg by mouth daily., Disp: , Rfl:  .  clopidogrel (PLAVIX) 75 MG tablet, Take 75 mg by mouth daily., Disp: , Rfl:  .  EPINEPHrine 0.3 mg/0.3 mL IJ SOAJ injection, Inject 0.3 mg into the muscle as needed for anaphylaxis., Disp: , Rfl:  .  famotidine (PEPCID) 20 MG tablet, Take 20 mg by mouth daily., Disp: , Rfl:  .  glipiZIDE (GLUCOTROL) 10 MG tablet, Take 1.5 tablets (15 mg total) by mouth 2 (two) times daily before a meal., Disp: 270 tablet, Rfl: 1 .  glucose blood test strip, Use OneTouch Verio test strips as instructed to check blood sugar twice daily. DX:E11.65, Disp: 100 each, Rfl: 3 .  hydrochlorothiazide (HYDRODIURIL) 25 MG tablet, Take 1 tablet (25 mg total) by mouth daily., Disp: , Rfl:  .  isosorbide mononitrate (IMDUR) 60 MG 24 hr tablet, Take 1.5 tablets (90 mg total) by mouth daily., Disp: 45 tablet, Rfl: 5 .  metFORMIN (GLUCOPHAGE) 1000 MG tablet, Take 1 tablet (1,000 mg total) by mouth 2 (two) times daily with a meal. Restart on 10/02/19., Disp: 180 tablet, Rfl: 3 .  nitroGLYCERIN (NITROSTAT) 0.4 MG SL tablet, Place 0.4 mg under the tongue every 5 (five) minutes as needed  for chest pain. , Disp: , Rfl:  .  pioglitazone (ACTOS) 45 MG tablet, Take 1 tablet (45 mg total) by mouth daily., Disp: 90 tablet, Rfl: 1 .  vitamin B-12 (CYANOCOBALAMIN) 1000 MCG tablet, Take 1,000 mcg by mouth daily., Disp: , Rfl:   Past Medical History: Past Medical History:  Diagnosis Date  . CKD (chronic kidney disease), stage III   . Coronary artery disease    a.  H/o NSTEMI s/p LAD/RCA stenting; b. 08/2019 PCI: RCA 99p (2.5x15 Resolute Onyx DES), 75p/m, mild ISR; c. 09/2019 PCI: 85p - heavily calcified (intravascular lithotripsy & 3.0x26 Resolute Onyx DES); d. 10/18/19 Relook cath: LM 25, LAD 21mISR, 75d, 70/50d/apical, RI 80, LCX 50p/m/d, RCA 30p ISR, 233mSR, 50d, RPDA 60-->Med Rx.  . Diabetes mellitus without complication ([facility]  . Diastolic dysfunction    a. 08/2019 Echo: EF 50-55%, no rwma, Gr1 DD, nl RV size/fxn. Triv MR.  . Marland KitchenERD (gastroesophageal reflux disease)   . Hyperlipidemia   . Hypertension   . NSTEMI (non-ST elevated myocardial infarction) ([facility]  . Sleep apnea   . Tobacco abuse     Tobacco Use: Social History   Tobacco Use  Smoking Status Current Every Day Smoker  . Packs/day: 0.50  . Years: 50.00  . Pack years: 25.00  . Types: Cigarettes  Smokeless Tobacco Never  Used  Tobacco Comment   Patient states he is ready to quit    Labs: Recent Review Flowsheet Data    Labs for ITP Cardiac and Pulmonary Rehab Latest Ref Rng & Units 08/31/2019 09/01/2019   Cholestrol 0 - 200 mg/dL - 227(H)   LDLCALC 0 - 99 mg/dL - UNABLE TO CALCULATE IF TRIGLYCERIDE OVER 400 mg/dL   LDLDIRECT 0 - 99 mg/dL - 71.5   HDL >40 mg/dL - 34(L)   Trlycerides <150 mg/dL - 755(H)   Hemoglobin A1c 4.8 - 5.6 % 12.1(H) -       Exercise Target Goals: Exercise Program Goal: Individual exercise prescription set using results from initial 6 min walk test and THRR while considering  patient's activity barriers and safety.   Exercise Prescription Goal: Initial exercise prescription  builds to 30-45 minutes a day of aerobic activity, 2-3 days per week.  Home exercise guidelines will be given to patient during program as part of exercise prescription that the participant will acknowledge.   Education: Aerobic Exercise & Resistance Training: - Gives group verbal and written instruction on the various components of exercise. Focuses on aerobic and resistive training programs and the benefits of this training and how to safely progress through these programs..   Cardiac Rehab from 12/16/2019 in [facility] Cardiac and Pulmonary Rehab  Date 12/16/19  Hilda Blades only on 10/28]  Educator [facility]  Instruction Review Code 1- [employer] Understanding      Education: Exercise & Equipment Safety: - Individual verbal instruction and demonstration of equipment use and safety with use of the equipment.   Cardiac Rehab from 12/16/2019 in [facility] Cardiac and Pulmonary Rehab  Date 11/15/19  Educator AS  Instruction Review Code 1- Verbalizes Understanding      Education: Exercise Physiology & General Exercise Guidelines: - Group verbal and written instruction with models to review the exercise physiology of the cardiovascular system and associated critical values. Provides general exercise guidelines with specific guidelines to those with heart or lung disease.    Cardiac Rehab from 12/16/2019 in [facility] Cardiac and Pulmonary Rehab  Date 12/02/19  Educator [facility]  Instruction Review Code 1- Verbalizes Understanding      Education: Flexibility, Balance, Mind/Body Relaxation: Provides group verbal/written instruction on the benefits of flexibility and balance training, including mind/body exercise modes such as yoga, pilates and tai chi.  Demonstration and skill practice provided.   Activity Barriers & Risk Stratification:   6 Minute Walk:  6 Minute Walk    Row Name 11/15/19 1610         6 Minute Walk   Phase Initial     Distance 975 feet     Walk Time 6 minutes     # of Rest Breaks 0      MPH 1.85     METS 2.1     RPE 11     Perceived Dyspnea  1     VO2 Peak 7.4     Symptoms Yes (comment)     Comments hip pain 3/10     Resting HR 67 bpm     Resting BP 100/68     Resting Oxygen Saturation  97 %     Exercise Oxygen Saturation  during 6 min walk 98 %     Max Ex. HR 76 bpm     Max Ex. BP 112/56     2 Minute Post BP 92/56            Oxygen Initial Assessment:  Oxygen Re-Evaluation:   Oxygen Discharge (Final Oxygen Re-Evaluation):   Initial Exercise Prescription:  Initial Exercise Prescription - 11/15/19 1600      Date of Initial Exercise RX and Referring Provider   Date 11/15/19    Referring Provider Arida      Treadmill   MPH 1    Grade 0    Minutes 15    METs 2      Recumbant Bike   Level 1    RPM 60    Minutes 15    METs 2      NuStep   Level 1    SPM 80    Minutes 15    METs 2      REL-XR   Level 1    Speed 50    Minutes 15    METs 2      T5 Nustep   Level 1    SPM 80    Minutes 15    METs 2      Prescription Details   Frequency (times per week) 3    Duration Progress to 30 minutes of continuous aerobic without signs/symptoms of physical distress      Intensity   THRR 40-80% of Max Heartrate 104-140    Ratings of Perceived Exertion 11-13    Perceived Dyspnea 0-4      Progression   Progression Continue to progress workloads to maintain intensity without signs/symptoms of physical distress.      Resistance Training   Training Prescription Yes    Weight 3 lb    Reps 10-15           Perform Capillary Blood Glucose checks as needed.  Exercise Prescription Changes:  Exercise Prescription Changes    Row Name 12/06/19 0800 12/20/19 1000           Response to Exercise   Blood Pressure (Admit) 108/62 112/60      Blood Pressure (Exercise) 112/68 146/64      Blood Pressure (Exit) 106/62 92/56      Heart Rate (Admit) 71 bpm 73 bpm      Heart Rate (Exercise) 82 bpm 91 bpm      Heart Rate (Exit) 73 bpm 75 bpm       Rating of Perceived Exertion (Exercise) 13 13      Symptoms none none      Comments second full day of exercise --      Duration Progress to 30 minutes of  aerobic without signs/symptoms of physical distress Continue with 30 min of aerobic exercise without signs/symptoms of physical distress.      Intensity THRR unchanged THRR unchanged        Progression   Progression Continue to progress workloads to maintain intensity without signs/symptoms of physical distress. Continue to progress workloads to maintain intensity without signs/symptoms of physical distress.      Average METs 2.02 1.92        Resistance Training   Training Prescription Yes Yes      Weight 3 lb 3 lb      Reps 10-15 10-15        Interval Training   Interval Training No No        Treadmill   MPH 1.1 1.2      Grade 0 0      Minutes 15 15      METs 1.84 1.84        T5 Nustep   Level 1 1  SPM -- 80      Minutes 15 15      METs 2.2 2             Exercise Comments:  Exercise Comments    Row Name 11/30/19 1016           Exercise Comments First full day of exercise!  Patient was oriented to gym and equipment including functions, settings, policies, and procedures.  Patient's individual exercise prescription and treatment plan were reviewed.  All starting workloads were established based on the results of the 6 minute walk test done at initial orientation visit.  The plan for exercise progression was also introduced and progression will be customized based on patient's performance and goals.              Exercise Goals and Review:  Exercise Goals    Row Name 11/15/19 1619             Exercise Goals   Increase Physical Activity Yes       Intervention Provide advice, education, support and counseling about physical activity/exercise needs.;Develop an individualized exercise prescription for aerobic and resistive training based on initial evaluation findings, risk stratification, comorbidities and  participant's personal goals.       Expected Outcomes Short Term: Attend rehab on a regular basis to increase amount of physical activity.;Long Term: Add in home exercise to make exercise part of routine and to increase amount of physical activity.;Long Term: Exercising regularly at least 3-5 days a week.       Increase Strength and Stamina Yes       Intervention Provide advice, education, support and counseling about physical activity/exercise needs.;Develop an individualized exercise prescription for aerobic and resistive training based on initial evaluation findings, risk stratification, comorbidities and participant's personal goals.       Expected Outcomes Short Term: Increase workloads from initial exercise prescription for resistance, speed, and METs.;Short Term: Perform resistance training exercises routinely during rehab and add in resistance training at home;Long Term: Improve cardiorespiratory fitness, muscular endurance and strength as measured by increased METs and functional capacity (6MWT)       Able to understand and use rate of perceived exertion (RPE) scale Yes       Intervention Provide education and explanation on how to use RPE scale       Expected Outcomes Short Term: Able to use RPE daily in rehab to express subjective intensity level;Long Term:  Able to use RPE to guide intensity level when exercising independently       Able to understand and use Dyspnea scale Yes       Intervention Provide education and explanation on how to use Dyspnea scale       Expected Outcomes Short Term: Able to use Dyspnea scale daily in rehab to express subjective sense of shortness of breath during exertion;Long Term: Able to use Dyspnea scale to guide intensity level when exercising independently       Knowledge and understanding of Target Heart Rate Range (THRR) Yes       Intervention Provide education and explanation of THRR including how the numbers were predicted and where they are located for  reference       Expected Outcomes Short Term: Able to state/look up THRR;Short Term: Able to use daily as guideline for intensity in rehab;Long Term: Able to use THRR to govern intensity when exercising independently       Able to check pulse independently Yes  Intervention Provide education and demonstration on how to check pulse in carotid and radial arteries.;Review the importance of being able to check your own pulse for safety during independent exercise       Expected Outcomes Short Term: Able to explain why pulse checking is important during independent exercise;Long Term: Able to check pulse independently and accurately       Understanding of Exercise Prescription Yes       Intervention Provide education, explanation, and written materials on patient's individual exercise prescription       Expected Outcomes Short Term: Able to explain program exercise prescription;Long Term: Able to explain home exercise prescription to exercise independently              Exercise Goals Re-Evaluation :  Exercise Goals Re-Evaluation    Row Name 11/30/19 1016 12/06/19 0838 12/16/19 0938 12/20/19 1044       Exercise Goal Re-Evaluation   Exercise Goals Review Able to understand and use rate of perceived exertion (RPE) scale;Knowledge and understanding of Target Heart Rate Range (THRR);Understanding of Exercise Prescription Increase Physical Activity;Increase Strength and Stamina;Understanding of Exercise Prescription Increase Physical Activity;Increase Strength and Stamina;Understanding of Exercise Prescription Increase Physical Activity;Increase Strength and Stamina;Understanding of Exercise Prescription    Comments Reviewed RPE and dyspnea scales, THR and program prescription with pt today.  Pt voiced understanding and was given a copy of goals to take home. Taveon is off to a good start in rehab.  He is already up to 2.2 METs on the T5 NuStep.  We will continue to monitor his progress. Brinley walks  everyday for 15 minutes at RPE 12. He has no weights at home at this time. EP has not gone over home execise with Makih yet. Rosemary has tolerated exercise well in his first sessions.  Staff will monitor progress.    Expected Outcomes Short: Use RPE daily to regulate intensity. Long: Follow program prescription in THR. Short: Continue to attend rehab regularly Long: Continue to follow program prescription ST: increase walk to 20 minutes LT: exercise 150 minutes of moderate activity Short: review home exercise with staff Long: exercise consistently 3-5 days per week           Discharge Exercise Prescription (Final Exercise Prescription Changes):  Exercise Prescription Changes - 12/20/19 1000      Response to Exercise   Blood Pressure (Admit) 112/60    Blood Pressure (Exercise) 146/64    Blood Pressure (Exit) 92/56    Heart Rate (Admit) 73 bpm    Heart Rate (Exercise) 91 bpm    Heart Rate (Exit) 75 bpm    Rating of Perceived Exertion (Exercise) 13    Symptoms none    Duration Continue with 30 min of aerobic exercise without signs/symptoms of physical distress.    Intensity THRR unchanged      Progression   Progression Continue to progress workloads to maintain intensity without signs/symptoms of physical distress.    Average METs 1.92      Resistance Training   Training Prescription Yes    Weight 3 lb    Reps 10-15      Interval Training   Interval Training No      Treadmill   MPH 1.2    Grade 0    Minutes 15    METs 1.84      T5 Nustep   Level 1    SPM 80    Minutes 15    METs 2  Nutrition:  Target Goals: Understanding of nutrition guidelines, daily intake of sodium <1539m, cholesterol <2078m calories 30% from fat and 7% or less from saturated fats, daily to have 5 or more servings of fruits and vegetables.  Education: Controlling Sodium/Reading Food Labels -Group verbal and written material supporting the discussion of sodium use in heart healthy  nutrition. Review and explanation with models, verbal and written materials for utilization of the food label.   Education: General Nutrition Guidelines/Fats and Fiber: -Group instruction provided by verbal, written material, models and posters to present the general guidelines for heart healthy nutrition. Gives an explanation and review of dietary fats and fiber.   Biometrics:  Pre Biometrics - 11/15/19 1620      Pre Biometrics   Height 5' 6.5" (1.689 m)    Weight 211 lb 12.8 oz (96.1 kg)    BMI (Calculated) 33.68    Single Leg Stand 28.5 seconds            Nutrition Therapy Plan and Nutrition Goals:  Nutrition Therapy & Goals - 12/13/19 1202      Nutrition Therapy   Diet heart healthy, low Na, T2DM    Drug/Food Interactions Statins/Certain Fruits    Protein (specify units) 75g    Fiber 30 grams    Whole Grain Foods 3 servings    Saturated Fats 12 max. grams    Fruits and Vegetables 8 servings/day    Sodium 1.5 grams      Personal Nutrition Goals   Nutrition Goal ST: fruit and peanutbutter for a snack, popcorn with nuts LT: A1C <7; A1C in July was 12, 1/2 grains whole grains, follow MyPlate, 3 meals with 2 snacks, 8 fruits and vegetables, <2g Na    Comments He reports lately not eating a lot, he is afraid of having high BG. Sat. BG 190 - cajun filet biscuit and chicken stew for dinner. Just started seeing new endocronologist - will see in November. PCP put him on glipizide, new MD increased. his very high BG could be due to medication management - encouraged to let doctor know what is going on, he could be at risk for a hyperglycemic emergency. B; scrambled egg or sandwich L: sandwich for lunch, chicken sandwich take out. D: chicken, fish, corn, green beans. S: potato chips Drinks: tea - diluted (sweet tea - 1 cup of sugar per gallon) with dinner, water, sometimes diet soda. Discussed  heart healthy and diabetes friendly eating.      Intervention Plan   Intervention Prescribe,  educate and counsel regarding individualized specific dietary modifications aiming towards targeted core components such as weight, hypertension, lipid management, diabetes, heart failure and other comorbidities.;Nutrition handout(s) given to patient.    Expected Outcomes Short Term Goal: Understand basic principles of dietary content, such as calories, fat, sodium, cholesterol and nutrients.;Short Term Goal: A plan has been developed with personal nutrition goals set during dietitian appointment.;Long Term Goal: Adherence to prescribed nutrition plan.           Nutrition Assessments:  Nutrition Assessments - 11/15/19 1623      MEDFICTS Scores   Pre Score 36           MEDIFICTS Score Key:          ?70 Need to make dietary changes          40-70 Heart Healthy Diet         ? 40 Therapeutic Level Cholesterol Diet  Nutrition Goals Re-Evaluation:   Nutrition Goals Discharge (  Final Nutrition Goals Re-Evaluation):   Psychosocial: Target Goals: Acknowledge presence or absence of significant depression and/or stress, maximize coping skills, provide positive support system. Participant is able to verbalize types and ability to use techniques and skills needed for reducing stress and depression.   Education: Depression - Provides group verbal and written instruction on the correlation between heart/lung disease and depressed mood, treatment options, and the stigmas associated with seeking treatment.   Education: Sleep Hygiene -Provides group verbal and written instruction about how sleep can affect your health.  Define sleep hygiene, discuss sleep cycles and impact of sleep habits. Review good sleep hygiene tips.     Education: Stress and Anxiety: - Provides group verbal and written instruction about the health risks of elevated stress and causes of high stress.  Discuss the correlation between heart/lung disease and anxiety and treatment options. Review healthy ways to manage with  stress and anxiety.    Initial Review & Psychosocial Screening:  Initial Psych Review & Screening - 11/11/19 0941      Initial Review   Current issues with None Identified      Family Dynamics   Good Support System? Yes    Comments He can look to his son, daughter and fiance for support. He feels positive about his health and wants to spend more time with his grandchildren.      Barriers   Psychosocial barriers to participate in program There are no identifiable barriers or psychosocial needs.;The patient should benefit from training in stress management and relaxation.      Screening Interventions   Interventions Encouraged to exercise           Quality of Life Scores:   Quality of Life - 11/15/19 1622      Quality of Life   Select Quality of Life      Quality of Life Scores   Health/Function Pre 21.11 %    Socioeconomic Pre 28.8 %    Psych/Spiritual Pre 30 %    Family Pre 26.4 %    GLOBAL Pre 25.21 %          Scores of 19 and below usually indicate a poorer quality of life in these areas.  A difference of  2-3 points is a clinically meaningful difference.  A difference of 2-3 points in the total score of the Quality of Life Index has been associated with significant improvement in overall quality of life, self-image, physical symptoms, and general health in studies assessing change in quality of life.  PHQ-9: Recent Review Flowsheet Data    Depression screen [facility] 2/9 11/15/2019   Decreased Interest 1   Down, Depressed, Hopeless 0   PHQ - 2 Score 1   Altered sleeping 0   Tired, decreased energy 2   Change in appetite 0   Feeling bad or failure about yourself  0   Trouble concentrating 0   Moving slowly or fidgety/restless 0   Suicidal thoughts 0   PHQ-9 Score 3     Interpretation of Total Score  Total Score Depression Severity:  1-4 = Minimal depression, 5-9 = Mild depression, 10-14 = Moderate depression, 15-19 = Moderately severe depression, 20-27 = Severe  depression   Psychosocial Evaluation and Intervention:  Psychosocial Evaluation - 11/11/19 0945      Psychosocial Evaluation & Interventions   Interventions Encouraged to exercise with the program and follow exercise prescription    Comments He can look to his son, daughter and fiance for support. He feels  positive about his health and wants to spend more time with his grandchildren.    Expected Outcomes Short: Exercise regularly to support mental health and notify staff of any changes. Long: maintain mental health and well being through teaching of rehab or prescribed medications independently.    Continue Psychosocial Services  Follow up required by staff           Psychosocial Re-Evaluation:  Psychosocial Re-Evaluation    Harris Name 12/16/19 0941             Psychosocial Re-Evaluation   Comments He reports no stress concerns at this time. He reports his family is a good support system for him. He likes to fish to relax.       Expected Outcomes ST: continue to reduce stress by fishing with grandchildren LT: maintain positive outlook.       Interventions Encouraged to attend Cardiac Rehabilitation for the exercise       Continue Psychosocial Services  Follow up required by staff              Psychosocial Discharge (Final Psychosocial Re-Evaluation):  Psychosocial Re-Evaluation - 12/16/19 0941      Psychosocial Re-Evaluation   Comments He reports no stress concerns at this time. He reports his family is a good support system for him. He likes to fish to relax.    Expected Outcomes ST: continue to reduce stress by fishing with grandchildren LT: maintain positive outlook.    Interventions Encouraged to attend Cardiac Rehabilitation for the exercise    Continue Psychosocial Services  Follow up required by staff           Vocational Rehabilitation: Provide vocational rehab assistance to qualifying candidates.   Vocational Rehab Evaluation &  Intervention:   Education: Education Goals: Education classes will be provided on a variety of topics geared toward better understanding of heart health and risk factor modification. Participant will state understanding/return demonstration of topics presented as noted by education test scores.  Learning Barriers/Preferences:  Learning Barriers/Preferences - 11/11/19 0940      Learning Barriers/Preferences   Learning Barriers None    Learning Preferences None           General Cardiac Education Topics:  AED/CPR: - Group verbal and written instruction with the use of models to demonstrate the basic use of the AED with the basic ABC's of resuscitation.   Anatomy & Physiology of the Heart: - Group verbal and written instruction and models provide basic cardiac anatomy and physiology, with the coronary electrical and arterial systems. Review of Valvular disease and Heart Failure   Cardiac Rehab from 12/16/2019 in [facility] Cardiac and Pulmonary Rehab  Date 12/16/19  Educator SB  Instruction Review Code 1- Verbalizes Understanding      Cardiac Procedures: - Group verbal and written instruction to review commonly prescribed medications for heart disease. Reviews the medication, class of the drug, and side effects. Includes the steps to properly store meds and maintain the prescription regimen. (beta blockers and nitrates)   Cardiac Rehab from 12/16/2019 in [facility] Cardiac and Pulmonary Rehab  Date 12/16/19  Educator SB  Instruction Review Code 1- Verbalizes Understanding      Cardiac Medications I: - Group verbal and written instruction to review commonly prescribed medications for heart disease. Reviews the medication, class of the drug, and side effects. Includes the steps to properly store meds and maintain the prescription regimen.   Cardiac Medications II: -Group verbal and written instruction to review commonly prescribed  medications for heart disease. Reviews the medication,  class of the drug, and side effects. (all other drug classes)    Go Sex-Intimacy & Heart Disease, Get SMART - Goal Setting: - Group verbal and written instruction through game format to discuss heart disease and the return to sexual intimacy. Provides group verbal and written material to discuss and apply goal setting through the application of the S.M.A.R.T. Method.   Cardiac Rehab from 12/16/2019 in [facility] Cardiac and Pulmonary Rehab  Date 12/16/19  Educator SB  Instruction Review Code 1- Verbalizes Understanding      Other Matters of the Heart: - Provides group verbal, written materials and models to describe Stable Angina and Peripheral Artery. Includes description of the disease process and treatment options available to the cardiac patient.   Infection Prevention: - Provides verbal and written material to individual with discussion of infection control including proper hand washing and proper equipment cleaning during exercise session.   Cardiac Rehab from 11/11/2019 in [facility] Cardiac and Pulmonary Rehab  Date 11/11/19  Educator [facility]  Instruction Review Code 1- Verbalizes Understanding      Falls Prevention: - Provides verbal and written material to individual with discussion of falls prevention and safety.   Cardiac Rehab from 12/16/2019 in [facility] Cardiac and Pulmonary Rehab  Date 11/15/19  Educator AS  Instruction Review Code 1- Verbalizes Understanding      Other: -Provides group and verbal instruction on various topics (see comments)   Cardiac Rehab from 12/16/2019 in [facility] Cardiac and Pulmonary Rehab  Date 11/15/19  [facility] Seamen quit smoking packet]  Educator AS  Instruction Review Code 1- Verbalizes Understanding      Knowledge Questionnaire Score:  Knowledge Questionnaire Score - 11/15/19 1621      Knowledge Questionnaire Score   Pre Score 21/26 HF,exercise,nutrition           Core Components/Risk Factors/Patient Goals at Admission:  Personal Goals and Risk Factors  at Admission - 11/15/19 1623      Core Components/Risk Factors/Patient Goals on Admission    Weight Management Yes;Weight Loss    Intervention Weight Management: Develop a combined nutrition and exercise program designed to reach desired caloric intake, while maintaining appropriate intake of nutrient and fiber, sodium and fats, and appropriate energy expenditure required for the weight goal.;Weight Management: Provide education and appropriate resources to help participant work on and attain dietary goals.;Weight Management/Obesity: Establish reasonable short term and long term weight goals.    Admit Weight 211 lb 12.8 oz (96.1 kg)    Goal Weight: Short Term 205 lb (93 kg)    Goal Weight: Long Term 200 lb (90.7 kg)    Expected Outcomes Short Term: Continue to assess and modify interventions until short term weight is achieved;Long Term: Adherence to nutrition and physical activity/exercise program aimed toward attainment of established weight goal;Weight Maintenance: Understanding of the daily nutrition guidelines, which includes 25-35% calories from fat, 7% or less cal from saturated fats, less than 253m cholesterol, less than 1.5gm of sodium, & 5 or more servings of fruits and vegetables daily;Weight Loss: Understanding of general recommendations for a balanced deficit meal plan, which promotes 1-2 lb weight loss per week and includes a negative energy balance of [phone] kcal/d;Understanding of distribution of calorie intake throughout the day with the consumption of 4-5 meals/snacks;Understanding recommendations for meals to include 15-35% energy as protein, 25-35% energy from fat, 35-60% energy from carbohydrates, less than 206mof dietary cholesterol, 20-35 gm of total fiber daily    Tobacco  Cessation Yes    Number of packs per day Half    Intervention Assist the participant in steps to quit. Provide individualized education and counseling about committing to Tobacco Cessation, relapse  prevention, and pharmacological support that can be provided by physician.;Advice worker, assist with locating and accessing local/national Quit Smoking programs, and support quit date choice.    Expected Outcomes Short Term: Will demonstrate readiness to quit, by selecting a quit date.;Short Term: Will quit all tobacco product use, adhering to prevention of relapse plan.;Long Term: Complete abstinence from all tobacco products for at least 12 months from quit date.    Diabetes Yes    Intervention Provide education about signs/symptoms and action to take for hypo/hyperglycemia.;Provide education about proper nutrition, including hydration, and aerobic/resistive exercise prescription along with prescribed medications to achieve blood glucose in normal ranges: Fasting glucose 65-99 mg/dL    Expected Outcomes Short Term: Participant verbalizes understanding of the signs/symptoms and immediate care of hyper/hypoglycemia, proper foot care and importance of medication, aerobic/resistive exercise and nutrition plan for blood glucose control.;Long Term: Attainment of HbA1C < 7%.    Hypertension Yes    Intervention Provide education on lifestyle modifcations including regular physical activity/exercise, weight management, moderate sodium restriction and increased consumption of fresh fruit, vegetables, and low fat dairy, alcohol moderation, and smoking cessation.;Monitor prescription use compliance.    Expected Outcomes Short Term: Continued assessment and intervention until BP is < 140/23m HG in hypertensive participants. < 130/864mHG in hypertensive participants with diabetes, heart failure or chronic kidney disease.;Long Term: Maintenance of blood pressure at goal levels.    Lipids Yes    Intervention Provide education and support for participant on nutrition & aerobic/resistive exercise along with prescribed medications to achieve LDL <7030mHDL >61m99m  Expected Outcomes Short Term:  Participant states understanding of desired cholesterol values and is compliant with medications prescribed. Participant is following exercise prescription and nutrition guidelines.;Long Term: Cholesterol controlled with medications as prescribed, with individualized exercise RX and with personalized nutrition plan. Value goals: LDL < 70mg66mL > 40 mg.           Education:Diabetes - Individual verbal and written instruction to review signs/symptoms of diabetes, desired ranges of glucose level fasting, after meals and with exercise. Acknowledge that pre and post exercise glucose checks will be done for 3 sessions at entry of program.   [facility]  Date 11/15/19  Educator AS  Instruction Review Code 1- Verbalizes Understanding      Education: Know Your Numbers and Risk Factors: -Group verbal and written instruction about important numbers in your health.  Discussion of what are risk factors and how they play a role in the disease process.  Review of Cholesterol, Blood Pressure, Diabetes, and BMI and the role they play in your overall health.   Core Components/Risk Factors/Patient Goals Review:   Goals and Risk Factor Review    Row Name 12/16/19 0942             Core Components/Risk Factors/Patient Goals Review   Personal Goals Review Weight Management/Obesity;Hypertension;Diabetes       Review HowarGarethrts weight relatively stable since beginning program with maybe a couple of pounds lost. RD just spoke with pt regarding nutrition less than a week ago, still working on changes. HowarMeghanrts his BP has been good at home and is similar to what we get in rehab (today 112/60). He is taking his medication as  prescribed. His BG has been running high 200's - believe this is medication related as on Trulicity his BG was better, now he is on glipizide and metformin and his BG has been running high. RD discussed nutritional changes to help  with BG. Adriell has an appointment with his doctor on Wednesday.       Expected Outcomes ST: see doctor, continue to take medication as prescibed and BP, continue to work on exercise and nutrition changes. LT: BG less than 200 in AM              Core Components/Risk Factors/Patient Goals at Discharge (Final Review):   Goals and Risk Factor Review - 12/16/19 0942      Core Components/Risk Factors/Patient Goals Review   Personal Goals Review Weight Management/Obesity;Hypertension;Diabetes    Review Logun reports weight relatively stable since beginning program with maybe a couple of pounds lost. RD just spoke with pt regarding nutrition less than a week ago, still working on changes. Austyn reports his BP has been good at home and is similar to what we get in rehab (today 112/60). He is taking his medication as prescribed. His BG has been running high 200's - believe this is medication related as on Trulicity his BG was better, now he is on glipizide and metformin and his BG has been running high. RD discussed nutritional changes to help with BG. Lemar has an appointment with his doctor on Wednesday.    Expected Outcomes ST: see doctor, continue to take medication as prescibed and BP, continue to work on exercise and nutrition changes. LT: BG less than 200 in AM           ITP Comments:  ITP Comments    Row Name 11/11/19 2505 11/15/19 1629 11/24/19 0559 11/30/19 1015 12/13/19 1241   ITP Comments Virtual Visit completed. Patient informed on EP and RD appointment and 6 Minute walk test. Patient also informed of patient health questionnaires on My Chart. Patient Verbalizes understanding. Visit diagnosis can be found in [facility] 10/18/2019. Completed 6MWT and gym orientation. Initial ITP created and sent for review to Dr. Emily Filbert, Medical Director. 30 Day review completed. Medical Director ITP review done, changes made as directed, and signed approval by Medical Director. First full day of exercise!   Patient was oriented to gym and equipment including functions, settings, policies, and procedures.  Patient's individual exercise prescription and treatment plan were reviewed.  All starting workloads were established based on the results of the 6 minute walk test done at initial orientation visit.  The plan for exercise progression was also introduced and progression will be customized based on patient's performance and goals. Completed initial RD evaluation.   [place] Name 12/22/19 0658 12/22/19 0659         ITP Comments Christorpher has had blood glucose levels above 300 several days. Has been sent home to review this with his physician. 30 Day review completed. Medical Director ITP review done, changes made as directed, and signed approval by Medical Director.             Comments:

## 2019-12-23 ENCOUNTER — Encounter: Payer: 59 | Attending: Cardiovascular Disease

## 2019-12-23 DIAGNOSIS — Z955 Presence of coronary angioplasty implant and graft: Secondary | ICD-10-CM | POA: Insufficient documentation

## 2019-12-28 ENCOUNTER — Telehealth: Payer: Self-pay

## 2019-12-28 NOTE — Telephone Encounter (Signed)
Called pt as he has not been to rehab since late October and we have not heard from him. When we last spoke he had a BG over 300 and he was going to contact his MD - he also had an appointment to address his recent high BG the next day. Checking in on pt. Left message on home and mobile number.

## 2019-12-29 ENCOUNTER — Other Ambulatory Visit: Payer: Self-pay

## 2019-12-29 ENCOUNTER — Ambulatory Visit (INDEPENDENT_AMBULATORY_CARE_PROVIDER_SITE_OTHER): Payer: 59 | Admitting: Nurse Practitioner

## 2019-12-29 ENCOUNTER — Encounter: Payer: Self-pay | Admitting: Nurse Practitioner

## 2019-12-29 VITALS — BP 120/80 | HR 66 | Ht 66.0 in | Wt 218.4 lb

## 2019-12-29 DIAGNOSIS — I25118 Atherosclerotic heart disease of native coronary artery with other forms of angina pectoris: Secondary | ICD-10-CM | POA: Diagnosis not present

## 2019-12-29 DIAGNOSIS — E785 Hyperlipidemia, unspecified: Secondary | ICD-10-CM

## 2019-12-29 DIAGNOSIS — N183 Chronic kidney disease, stage 3 unspecified: Secondary | ICD-10-CM

## 2019-12-29 DIAGNOSIS — Z720 Tobacco use: Secondary | ICD-10-CM

## 2019-12-29 DIAGNOSIS — I10 Essential (primary) hypertension: Secondary | ICD-10-CM

## 2019-12-29 NOTE — Progress Notes (Signed)
Office Visit    Patient Name: [name] Date of Encounter: 12/29/2019  Primary Care Provider:  Cyndi Bender, PA-C Primary Cardiologist:  Kathlyn Sacramento, MD  Chief Complaint    59 year old male with a history of CAD status post LAD and RCA stenting, hypertension, hyperlipidemia, obesity, sleep apnea, tobacco abuse, and GERD, who presents for follow-up related to chest pain/CAD.  Past Medical History    Past Medical History:  Diagnosis Date  . CKD (chronic kidney disease), stage III ([place])   . Coronary artery disease    a.  H/o NSTEMI s/p LAD/RCA stenting; b. 08/2019 PCI: RCA 99p (2.5x15 Resolute Onyx DES), 75p/m, mild ISR; c. 09/2019 PCI: 85p - heavily calcified (intravascular lithotripsy & 3.0x26 Resolute Onyx DES); d. 10/18/19 Relook cath: LM 25, LAD 75m ISR, 75d, 70/50d/apical, RI 80, LCX 50p/m/d, RCA 30p ISR, 10m ISR, 50d, RPDA 60-->Med Rx.  . Diabetes mellitus without complication ([place])   . Diastolic dysfunction    a. 08/2019 Echo: EF 50-55%, no rwma, Gr1 DD, nl RV size/fxn. Triv MR.  Marland Kitchen GERD (gastroesophageal reflux disease)   . Hyperlipidemia   . Hypertension   . NSTEMI (non-ST elevated myocardial infarction) ([place]) 08/2019  . Sleep apnea   . Tobacco abuse    Past Surgical History:  Procedure Laterality Date  . APPENDECTOMY    . CARDIAC CATHETERIZATION    . COLONOSCOPY    . COLONOSCOPY WITH PROPOFOL N/A 09/16/2017   Procedure: COLONOSCOPY WITH PROPOFOL;  Surgeon: Toledo, Benay Pike, MD;  Location: [facility] ENDOSCOPY;  Service: Gastroenterology;  Laterality: N/A;  (+) DM - oral  . CORONARY STENT INTERVENTION N/A 08/31/2019   Procedure: CORONARY STENT INTERVENTION;  Surgeon: Yolonda Kida, MD;  Location: [facility];  Service: Cardiovascular;  Laterality: N/A;  . CORONARY STENT INTERVENTION  09/29/2019  . CORONARY STENT INTERVENTION N/A 09/29/2019   Procedure: CORONARY STENT INTERVENTION;  Surgeon: Wellington Hampshire, MD;  Location: [place] [facility];  Service:  Cardiovascular;  Laterality: N/A;  . EXPLORATORY LAPAROTOMY    . EYE SURGERY    . HERNIA REPAIR    . LEFT HEART CATH AND CORONARY ANGIOGRAPHY N/A 08/31/2019   Procedure: LEFT HEART CATH AND CORONARY ANGIOGRAPHY possible PCI and stent;  Surgeon: Yolonda Kida, MD;  Location: [place] [facility];  Service: Cardiovascular;  Laterality: N/A;  . LEFT HEART CATH AND CORONARY ANGIOGRAPHY Left 10/18/2019   Procedure: LEFT HEART CATH AND CORONARY ANGIOGRAPHY;  Surgeon: Wellington Hampshire, MD;  Location: [place] [facility];  Service: Cardiovascular;  Laterality: Left;    Allergies  Allergies  Allergen Reactions  . Shellfish Allergy Anaphylaxis  . Penicillins Swelling    Childhood  . Erythromycin Rash and Other (See Comments)    Unknown    History of Present Illness    59 year old male with the above complex past medical history including CAD, diabetes, GERD, hypertension, hyperlipidemia, sleep apnea, obesity, and tobacco abuse.  He previously suffered a non-STEMI underwent stenting of LAD and mid RCA.  In July 2021, in the setting of unstable angina, he underwent repeat diagnostic catheterization revealing moderate diffuse disease throughout his left coronary tree with severe proximal RCA disease.  The previously placed stent had a 50% stenosis.  The most proximal lesion was successfully treated with a drug-eluting stent on August 31, 2019.  He had residual moderate to severe heavily calcified stenosis in the proximal to mid RCA.  Unfortunately, he continued to have exertional chest discomfort following  PCI and underwent repeat catheterization on September 29, 2019 successful intravascular lithotripsy and drug-eluting stent placement in the proximal RCA.  This was a difficult procedure in the setting of heavy calcification.  The previously placed stent remains widely patent.  He continued to have intermittent chest discomfort post PCI despite titration of oral nitrate therapy.  He underwent relook  catheterization October 18, 2019 revealing patent LAD and RCA stents, moderate diffuse distal LAD disease, 80% stenosis in a small ramus intermedius, moderate diffuse circumflex disease, and moderate distal RCA/RPDA disease.  Continued medical therapy was recommended and was felt that any progression of disease in the future would likely require treatment with coronary artery bypass grafting.  At his last clinic visit on September 22, he reported stable exertional chest discomfort and dyspnea occurring when walking longer distances.  He had increased his isosorbide mononitrate to 120 mg daily and I added Ranexa 500 mg twice daily unfortunately, he developed dizziness and nausea on Ranexa and he initially discontinued it but after a few days resumed it and has since tolerated.  He says that since starting Ranexa, he does have a little bit more exercise tolerance and less severe chest pain, thus he would like to continue it.  He continues to experience dyspnea on exertion but is also smoking roughly 3/4 of a pack of cigarettes a day.  He denies palpitations, PND, orthopnea, dizziness, syncope, edema, or early satiety.  He is now in the process of getting permanent disability and has paperwork with him today.  Home Medications    Prior to Admission medications   Medication Sig Start Date End Date Taking? Authorizing Provider  aspirin EC 81 MG tablet Take 81 mg by mouth daily.    [provider]  atorvastatin (LIPITOR) 80 MG tablet Take 80 mg by mouth daily.    [provider]  carvedilol (COREG) 3.125 MG tablet Take 1 tablet (3.125 mg total) by mouth 2 (two) times daily with a meal. 09/30/19   D[facility], Tami Lin, PA  Cinnamon 500 MG capsule Take 1,000 mg by mouth daily.    [provider]  clopidogrel (PLAVIX) 75 MG tablet Take 75 mg by mouth daily.    [provider]  EPINEPHrine 0.3 mg/0.3 mL IJ SOAJ injection Inject 0.3 mg into the muscle as needed for anaphylaxis.  06/10/15   [provider]  famotidine (PEPCID) 20 MG tablet Take 20 mg by mouth daily.    [provider]  glipiZIDE (GLUCOTROL) 10 MG tablet Take 1.5 tablets (15 mg total) by mouth 2 (two) times daily before a meal. 11/03/19   Shamleffer, Melanie Crazier, MD  glucose blood test strip Use OneTouch Verio test strips as instructed to check blood sugar twice daily. DX:E11.65 11/05/19   Shamleffer, Melanie Crazier, MD  hydrochlorothiazide (HYDRODIURIL) 25 MG tablet Take 1 tablet (25 mg total) by mouth daily. 09/28/19   Wellington Hampshire, MD  isosorbide mononitrate (IMDUR) 60 MG 24 hr tablet Take 1.5 tablets (90 mg total) by mouth daily. 10/08/19   Theora Gianotti, NP  metFORMIN (GLUCOPHAGE) 1000 MG tablet Take 1 tablet (1,000 mg total) by mouth 2 (two) times daily with a meal. Restart on 10/02/19. 11/03/19   Shamleffer, Melanie Crazier, MD  nitroGLYCERIN (NITROSTAT) 0.4 MG SL tablet Place 0.4 mg under the tongue every 5 (five) minutes as needed for chest pain.  08/19/19   [provider]  pioglitazone (ACTOS) 45 MG tablet Take 1 tablet (45 mg total) by mouth daily.  11/03/19   Shamleffer, Melanie Crazier, MD  vitamin B-12 (CYANOCOBALAMIN) 1000 MCG tablet Take 1,000 mcg by mouth daily.    [provider]    Review of Systems    Ongoing exertional chest discomfort and dyspnea on exertion, which is slightly improved following the addition of Ranexa in September.  He denies palpitations, PND, orthopnea, dizziness, syncope, edema, or early satiety.  All other systems reviewed and are otherwise negative except as noted above.  Physical Exam    VS:  BP 120/80 (BP Location: Left Arm, Patient Position: Sitting, Cuff Size: Normal)   Pulse 66   Ht 5\' 6"  (1.676 m)   Wt 218 lb 6 oz (99.1 kg)   SpO2 96%   BMI 35.25 kg/m  , BMI Body mass index is 35.25 kg/m. GEN: Obese, in no acute distress. HEENT: normal. Neck: Supple, no JVD, carotid bruits, or masses. Cardiac: RRR, no  murmurs, rubs, or gallops. No clubbing, cyanosis, edema.  Radials/PT 2+ and equal bilaterally.  Respiratory:  Respirations regular and unlabored, clear to auscultation bilaterally. GI: Soft, nontender, nondistended, BS + x 4. MS: no deformity or atrophy. Skin: warm and dry, no rash. Neuro:  Strength and sensation are intact. Psych: Normal affect.  Accessory Clinical Findings    ECG personally reviewed by me today -regular sinus rhythm, 66, left axis deviation, baseline artifact, nonspecific ST-T changes.  QTC is stable at 448 ms.- no acute changes.  Lab Results  Component Value Date   WBC 8.8 10/14/2019   HGB 13.0 10/14/2019   HCT 38.4 (L) 10/14/2019   MCV 93.2 10/14/2019   PLT 257 10/14/2019   Lab Results  Component Value Date   CREATININE 1.44 (H) 10/14/2019   BUN 22 (H) 10/14/2019   NA 135 10/14/2019   K 4.2 10/14/2019   CL 99 10/14/2019   CO2 24 10/14/2019   Lab Results  Component Value Date   ALT 20 07/07/2014   AST 22 07/07/2014   ALKPHOS 51 07/07/2014   BILITOT 0.4 07/07/2014   Lab Results  Component Value Date   CHOL 227 (H) 09/01/2019   HDL 34 (L) 09/01/2019   LDLCALC UNABLE TO CALCULATE IF TRIGLYCERIDE OVER 400 mg/dL 09/01/2019   LDLDIRECT 71.5 09/01/2019   TRIG 755 (H) 09/01/2019   CHOLHDL 6.7 09/01/2019    Lab Results  Component Value Date   HGBA1C 12.1 (H) 08/31/2019    Assessment & Plan    1.  Coronary artery disease with stable angina: Status post PCI drug-eluting stent placement to the RCA x2 in the summer of August 2021 with most recent relook catheterization August 30 showing stable right coronary artery disease with known diffuse distal LAD, circumflex, and ramus disease.  He has remained on medical therapy with the addition of Ranexa 500 mg twice daily in September.  Though he initially had nausea with Ranexa, this has since resolved and he is tolerating it well and would like to continue.  He has noted some improvement in activity tolerance and  less severe and frequent chest pain on Ranexa therapy.  He otherwise remains on aspirin, statin, Plavix, beta-blocker, and long-acting nitrate therapy.  2.  Essential hypertension: Stable on current regimen.  3.  Hyperlipidemia/hypertriglyceridemia: Total cholesterol 227 with direct LDL of 71.5 on July 14.  He remains on high potency statin therapy.  We again discussed lifestyle modification and I also offered Vascepa.  He does think this would be cost prohibitive and instead would like to try diet and exercise  first.  4.  Type 2 diabetes mellitus: A1c of 12.1 in July.  He is followed by endocrinology.  5.  Tobacco abuse: He continues smoke about three quarters of a pack of cigarettes per day.  We had a long discussion about this today.  He might be willing to accept a prescription for Wellbutrin but does not feel like he is in the right head space for quitting at this time.  He will let us know.  6.  Morbid obesity: We discussed importance of diet and exercise with a goal of weight loss.  He remains in cardiac rehab.  7.  Stage III chronic kidney disease: Stable by labs in August.  8.  Disposition: Follow-up in 3 months or sooner if necessary.  Murray Hodgkins, NP 12/29/2019, 5:49 PM

## 2019-12-29 NOTE — Patient Instructions (Signed)
Medication Instructions:  Your physician recommends that you continue on your current medications as directed. Please refer to the Current Medication list given to you today.  *If you need a refill on your cardiac medications before your next appointment, please call your pharmacy*   Lab Work: None ordered If you have labs (blood work) drawn today and your tests are completely normal, you will receive your results only by: Marland Kitchen MyChart Message (if you have MyChart) OR . A paper copy in the mail If you have any lab test that is abnormal or we need to change your treatment, we will call you to review the results.   Testing/Procedures: None odered   Follow-Up: At [facility], you and your health needs are our priority.  As part of our continuing mission to provide you with exceptional heart care, we have created designated Provider Care Teams.  These Care Teams include your primary Cardiologist (physician) and Advanced Practice Providers (APPs -  Physician Assistants and Nurse Practitioners) who all work together to provide you with the care you need, when you need it.  We recommend signing up for the patient portal called "MyChart".  Sign up information is provided on this After Visit Summary.  MyChart is used to connect with patients for Virtual Visits (Telemedicine).  Patients are able to view lab/test results, encounter notes, upcoming appointments, etc.  Non-urgent messages can be sent to your provider as well.   To learn more about what you can do with MyChart, go to [email or web address].    Your next appointment:   3 month(s)  The format for your next appointment:   In Person  Provider:   Kathlyn Sacramento, MD   Other Instructions N/A

## 2019-12-31 ENCOUNTER — Other Ambulatory Visit: Payer: Self-pay

## 2019-12-31 ENCOUNTER — Ambulatory Visit (INDEPENDENT_AMBULATORY_CARE_PROVIDER_SITE_OTHER): Payer: 59 | Admitting: Internal Medicine

## 2019-12-31 ENCOUNTER — Encounter: Payer: Self-pay | Admitting: Internal Medicine

## 2019-12-31 VITALS — BP 118/72 | HR 68 | Ht 66.0 in | Wt 218.2 lb

## 2019-12-31 DIAGNOSIS — E1159 Type 2 diabetes mellitus with other circulatory complications: Secondary | ICD-10-CM

## 2019-12-31 DIAGNOSIS — N1831 Chronic kidney disease, stage 3a: Secondary | ICD-10-CM

## 2019-12-31 DIAGNOSIS — E782 Mixed hyperlipidemia: Secondary | ICD-10-CM | POA: Diagnosis not present

## 2019-12-31 DIAGNOSIS — E1165 Type 2 diabetes mellitus with hyperglycemia: Secondary | ICD-10-CM

## 2019-12-31 DIAGNOSIS — E1122 Type 2 diabetes mellitus with diabetic chronic kidney disease: Secondary | ICD-10-CM | POA: Diagnosis not present

## 2019-12-31 DIAGNOSIS — N289 Disorder of kidney and ureter, unspecified: Secondary | ICD-10-CM

## 2019-12-31 DIAGNOSIS — N189 Chronic kidney disease, unspecified: Secondary | ICD-10-CM

## 2019-12-31 LAB — POCT GLYCOSYLATED HEMOGLOBIN (HGB A1C): Hemoglobin A1C: 9.1 % — AB (ref 4.0–5.6)

## 2019-12-31 LAB — MICROALBUMIN / CREATININE URINE RATIO
Creatinine,U: 226.6 mg/dL
Microalb Creat Ratio: 0.9 mg/g (ref 0.0–30.0)
Microalb, Ur: 1.9 mg/dL (ref 0.0–1.9)

## 2019-12-31 LAB — BASIC METABOLIC PANEL
BUN: 27 mg/dL — ABNORMAL HIGH (ref 6–23)
CO2: 24 mEq/L (ref 19–32)
Calcium: 9.5 mg/dL (ref 8.4–10.5)
Chloride: 99 mEq/L (ref 96–112)
Creatinine, Ser: 2.01 mg/dL — ABNORMAL HIGH (ref 0.40–1.50)
GFR: 35.22 mL/min — ABNORMAL LOW (ref >60.00)
Glucose, Bld: 189 mg/dL — ABNORMAL HIGH (ref 70–99)
Potassium: 5.2 mEq/L — ABNORMAL HIGH (ref 3.5–5.1)
Sodium: 132 mEq/L — ABNORMAL LOW (ref 135–145)

## 2019-12-31 LAB — LIPID PANEL
Cholesterol: 170 mg/dL (ref 0–200)
HDL: 35.7 mg/dL — ABNORMAL LOW (ref >39.00)
Total CHOL/HDL Ratio: 5
Triglycerides: 434 mg/dL — ABNORMAL HIGH (ref 0.0–149.0)

## 2019-12-31 LAB — LDL CHOLESTEROL, DIRECT: Direct LDL: 74 mg/dL

## 2019-12-31 MED ORDER — DAPAGLIFLOZIN PROPANEDIOL 10 MG PO TABS: 10.0000 mg | ORAL_TABLET | Freq: Every day | ORAL | 6 refills | Status: DC

## 2019-12-31 MED ORDER — DAPAGLIFLOZIN PROPANEDIOL 5 MG PO TABS: 5.0000 mg | ORAL_TABLET | Freq: Every day | ORAL | 0 refills | Status: DC

## 2019-12-31 MED ORDER — GLIPIZIDE 10 MG PO TABS: 20.0000 mg | ORAL_TABLET | Freq: Two times a day (BID) | ORAL | 2 refills | Status: DC

## 2019-12-31 NOTE — Patient Instructions (Signed)
-   Start Farxiga 5 mg, 1 tablet with Breakfast, after 30 days you will pick up the 10 mg tablets  -  Continue  Glipizide 10 mg to one and a half tablet before Breakfast and before supper - Continue Metformin 1000 mg twice daily  - Continue Pioglitazone 45 mg daily       HOW TO TREAT LOW BLOOD SUGARS (Blood sugar LESS THAN 70 MG/DL)  Please follow the RULE OF 15 for the treatment of hypoglycemia treatment (when your (blood sugars are less than 70 mg/dL)    STEP 1: Take 15 grams of carbohydrates when your blood sugar is low, which includes:   3-4 GLUCOSE TABS  OR  3-4 OZ OF JUICE OR REGULAR SODA OR  ONE TUBE OF GLUCOSE GEL     STEP 2: RECHECK blood sugar in 15 MINUTES STEP 3: If your blood sugar is still low at the 15 minute recheck --> then, go back to STEP 1 and treat AGAIN with another 15 grams of carbohydrates.

## 2019-12-31 NOTE — Progress Notes (Signed)
Name: [name]  Age/ Sex: 59 y.o., male   MRN/ DOB: [id number], [birth year]     PCP: Cyndi Bender, PA-C   Reason for Endocrinology Evaluation: Type 2 Diabetes Mellitus  Initial Endocrine Consultative Visit: 11/03/2019    PATIENT IDENTIFIER: [name] is a 59 y.o. male with a past medical history of T2DM, CAD, OSA and HTN.  The patient has followed with Endocrinology clinic since 11/03/2019 for consultative assistance with management of his diabetes.  DIABETIC HISTORY:  [name] was diagnosed with DM many years ago. Trulicity has caused nausea. His hemoglobin A1c has ranged from 9.0% in 12/2019, peaking at 12.1% in 10/2019.  No personal hx of pancreatitis    On his initial visit to our clinic he had an A1c of 12.1 %. He was on metformin, Glipizide and pioglitazone. We increased Glipizide   SUBJECTIVE:   During the last visit (11/03/2019): A1c 12.1% we increased glipizide, continue metformin and pioglitazone     Today (12/31/2019): [name] is here for a follow up on diabetes.  He is accompanied by his wife [name]. He checks his blood sugars 1-2 times daily. The patient has not had hypoglycemic episodes since the last clinic visit.  Patient snacks at night on peanut butter and crackers or chips.    HOME DIABETES REGIMEN:  Glipizide 10 mg to one and a half tablet before Breakfast and before supper Metformin 1000 mg twice daily  Pioglitazone 45 mg daily    Statin: yes ACE-I/ARB: no   METER DOWNLOAD SUMMARY: Date range evaluated: 10/14-11/01/2020 Fingerstick Blood Glucose Tests = 11 Average Number Tests/Day = 0.4 Overall Mean FS Glucose = 249 Standard Deviation = 60  BG Ranges: Low = 173 High = 345   Hypoglycemic Events/30 Days: BG < 50 = 0 Episodes of symptomatic severe hypoglycemia = 0    DIABETIC COMPLICATIONS: Microvascular complications:    Denies: Denies: CKD, retinopathy, neuropathy   Last Eye Exam: Completed 01/2019  Macrovascular  complications:   CAD ( S/P PCI)  Denies: CVA, PVD   HISTORY:  Past Medical History:  Past Medical History:  Diagnosis Date  . CKD (chronic kidney disease), stage III ([place])   . Coronary artery disease    a.  H/o NSTEMI s/p LAD/RCA stenting; b. 08/2019 PCI: RCA 99p (2.5x15 Resolute Onyx DES), 75p/m, mild ISR; c. 09/2019 PCI: 85p - heavily calcified (intravascular lithotripsy & 3.0x26 Resolute Onyx DES); d. 10/18/19 Relook cath: LM 25, LAD 12m ISR, 75d, 70/50d/apical, RI 80, LCX 50p/m/d, RCA 30p ISR, 70m ISR, 50d, RPDA 60-->Med Rx.  . Diabetes mellitus without complication ([place])   . Diastolic dysfunction    a. 08/2019 Echo: EF 50-55%, no rwma, Gr1 DD, nl RV size/fxn. Triv MR.  Marland Kitchen GERD (gastroesophageal reflux disease)   . Hyperlipidemia   . Hypertension   . NSTEMI (non-ST elevated myocardial infarction) ([place]) 08/2019  . Sleep apnea   . Tobacco abuse    Past Surgical History:  Past Surgical History:  Procedure Laterality Date  . APPENDECTOMY    . CARDIAC CATHETERIZATION    . COLONOSCOPY    . COLONOSCOPY WITH PROPOFOL N/A 09/16/2017   Procedure: COLONOSCOPY WITH PROPOFOL;  Surgeon: Toledo, Benay Pike, MD;  Location: [facility] END[facility]COPY;  Service: Gastroenterology;  Laterality: N/A;  (+) DM - oral  . CORONARY STENT INTERVENTION N/A 08/31/2019   Procedure: CORONARY STENT INTERVENTION;  Surgeon: Yolonda Kida, MD;  Location: [place] [facility];  Service: Cardiovascular;  Laterality: N/A;  .  CORONARY STENT INTERVENTION  09/29/2019  . CORONARY STENT INTERVENTION N/A 09/29/2019   Procedure: CORONARY STENT INTERVENTION;  Surgeon: Wellington Hampshire, MD;  Location: [place] [facility];  Service: Cardiovascular;  Laterality: N/A;  . EXPLORATORY LAPAROTOMY    . EYE SURGERY    . HERNIA REPAIR    . LEFT HEART CATH AND CORONARY ANGIOGRAPHY N/A 08/31/2019   Procedure: LEFT HEART CATH AND CORONARY ANGIOGRAPHY possible PCI and stent;  Surgeon: Yolonda Kida, MD;  Location: [place] [facility];   Service: Cardiovascular;  Laterality: N/A;  . LEFT HEART CATH AND CORONARY ANGIOGRAPHY Left 10/18/2019   Procedure: LEFT HEART CATH AND CORONARY ANGIOGRAPHY;  Surgeon: Wellington Hampshire, MD;  Location: [place] [facility];  Service: Cardiovascular;  Laterality: Left;    Social History:  reports that he has been smoking cigarettes. He has a 25.00 pack-year smoking history. He has never used smokeless tobacco. He reports that he does not drink alcohol and does not use drugs. Family History:  Family History  Problem Relation Age of Onset  . Heart disease Father   . Heart attack Father   . Hypertension Father   . Diabetes Father      HOME MEDICATIONS: Allergies as of 12/31/2019      Reactions   Shellfish Allergy Anaphylaxis   Penicillins Swelling   Childhood   Erythromycin Rash, Other (See Comments)   Unknown      Medication List       Accurate as of December 31, 2019  1:01 PM. If you have any questions, ask your nurse or doctor.        aspirin EC 81 MG tablet Take 81 mg by mouth daily.   atorvastatin 80 MG tablet Commonly known as: LIPITOR Take 80 mg by mouth daily.   carvedilol 3.125 MG tablet Commonly known as: COREG Take 1 tablet (3.125 mg total) by mouth 2 (two) times daily with a meal.   Cinnamon 500 MG capsule Take 1,000 mg by mouth daily.   clopidogrel 75 MG tablet Commonly known as: PLAVIX Take 75 mg by mouth daily.   EPINEPHrine 0.3 mg/0.3 mL Soaj injection Commonly known as: EPI-PEN Inject 0.3 mg into the muscle as needed for anaphylaxis.   famotidine 20 MG tablet Commonly known as: PEPCID Take 20 mg by mouth daily.   glipiZIDE 10 MG tablet Commonly known as: GLUCOTROL Take 2 tablets (20 mg total) by mouth 2 (two) times daily before a meal. What changed: how much to take Changed by: Dorita Sciara, MD   glucose blood test strip Use OneTouch Verio test strips as instructed to check blood sugar twice daily. DX:E11.65   hydrochlorothiazide  25 MG tablet Commonly known as: HYDRODIURIL Take 1 tablet (25 mg total) by mouth daily.   isosorbide mononitrate 60 MG 24 hr tablet Commonly known as: IMDUR Take 1.5 tablets (90 mg total) by mouth daily.   metFORMIN 1000 MG tablet Commonly known as: GLUCOPHAGE Take 1 tablet (1,000 mg total) by mouth 2 (two) times daily with a meal. Restart on 10/02/19.   nitroGLYCERIN 0.4 MG SL tablet Commonly known as: NITROSTAT Place 0.4 mg under the tongue every 5 (five) minutes as needed for chest pain.   pioglitazone 45 MG tablet Commonly known as: ACTOS Take 1 tablet (45 mg total) by mouth daily.   ranolazine 500 MG 12 hr tablet Commonly known as: RANEXA Take 500 mg by mouth 2 (two) times daily.   vitamin B-12 1000 MCG tablet  Commonly known as: CYANOCOBALAMIN Take 1,000 mcg by mouth daily.        OBJECTIVE:   Vital Signs: BP 118/72   Pulse 68   Ht 5\' 6"  (1.676 m)   Wt 218 lb 4 oz (99 kg)   SpO2 95%   BMI 35.23 kg/m   Wt Readings from Last 3 Encounters:  12/31/19 218 lb 4 oz (99 kg)  12/29/19 218 lb 6 oz (99.1 kg)  11/15/19 211 lb 12.8 oz (96.1 kg)     Exam: General: Pt appears well and is in NAD  Lungs: Clear with good BS bilat with no rales, rhonchi, or wheezes  Heart: RRR with normal S1 and S2 and no gallops; no murmurs; no rub  Abdomen: Normoactive bowel sounds, soft, nontender, without masses or organomegaly palpable  Extremities: No pretibial edema.  Neuro: MS is good with appropriate affect, pt is alert and Ox3       DM foot exam: 11/03/2019  The skin of the feet is intact without sores or ulcerations. The pedal pulses are 2+ on right and 2+ on left. The sensation is intact to a screening 5.07, 10 gram monofilament bilaterally    DATA REVIEWED:  Lab Results  Component Value Date   HGBA1C 9.1 (A) 12/31/2019   HGBA1C 12.1 (H) 08/31/2019   Lab Results  Component Value Date   MICROALBUR 1.9 12/31/2019   LDLCALC UNABLE TO CALCULATE IF TRIGLYCERIDE OVER  400 mg/dL 09/01/2019   CREATININE 2.01 (H) 12/31/2019     Lab Results  Component Value Date   CHOL 170 12/31/2019   HDL 35.70 (L) 12/31/2019   LDLCALC UNABLE TO CALCULATE IF TRIGLYCERIDE OVER 400 mg/dL 09/01/2019   LDLDIRECT 74.0 12/31/2019   TRIG (H) 12/31/2019    434.0 Triglyceride is over 400; calculations on Lipids are invalid.   CHOLHDL 5 12/31/2019         ASSESSMENT / PLAN / RECOMMENDATIONS:   1) Type 2 Diabetes Mellitus, With improving glycemic control controlled, With CKD III and macrovascular  complications - Most recent A1c of 9.1 %. Goal A1c < 7.0.    I have praised him on improved glycemic controled , A1c down from 12.1 % but I have emphasized the importance of low-carb diet, and to limit CHO intake specifically with snacks.   We initially discussed adding SGLT-2 inhibitors but we held on this as his GFR was low at 35  He is not a candidate for GLP-1 agonist and DPP-4 inhibitors at this time due to risk of pancreatitis and especially with a triglyceride level of > 500 mg/dL  Will reduce metformin as below due to low GFR  MEDICATIONS:  - Increase  Glipizide 10 mg to 2 tabs BID  - Decrease  Metformin 1000 mg 1 tablet daily  - Continue Pioglitazone 45 mg daily    EDUCATION / INSTRUCTIONS:  BG monitoring instructions: Patient is instructed to check his blood sugars 2 times a day, before breakfast and supper.  Call [facility] if: BG persistently < 70  . I reviewed the Rule of 15 for the treatment of hypoglycemia in detail with the patient. Literature supplied.     2) Diabetic complications:   Eye: Does not have known diabetic retinopathy.   Neuro/ Feet: Does not have known diabetic peripheral neuropathy .   Renal: Patient does not have known baseline CKD. He   is not a candidate for  ACEI/ARB at present due to hyperkalemia . MA/Cr ratio is normal  3) Mixed Dyslipidemia: Patient is on atorvastatin 80 mg daily . TG improved from 755  to 434 mg/dL. Will continue to monitor. If no improvement will consider add on therapy    4) Acute on chronic renal insufficiency:   - Unknown reason at this time. This is beyond the scope of endocrinology . Per pt he has been started on new medication since his last visit with me but he did not recall the name.  - Will hold off starting Farxiga at this time - Unable to start ACE/Abs due to hyperkalemia  - Will reduce Metformin to 50%  - Will defer further follow up to PCP   F/U in 3 months   Signed electronically by: Mack Guise, MD  [facility]  [facility] [address] [place], [place] [address] Phone: [phone] FAX: [phone]   CC: [address] [place] [address] Phone: [phone]  Fax: [phone]  Return to Endocrinology clinic as below: Future Appointments  Date Time Provider [place]  01/04/2020  9:15 AM [facility] S[facility] None  01/06/2020  9:15 AM [facility] None  01/11/2020  9:15 AM [facility] SESSION [facility] None  01/18/2020  9:15 AM [facility] SESSION [facility] None  01/20/2020  9:15 AM [facility] SESSION [facility] None  01/25/2020  9:15 AM [facility] SESSION [facility] None  01/27/2020  9:15 AM [facility] SESSION [facility] None  02/01/2020  9:15 AM [facility] SESSION [facility] None  02/03/2020  9:15 AM [facility] SESSION [facility] None  02/08/2020  9:15 AM [facility] SESSION [facility] None  02/10/2020  9:15 AM [facility] SESSION [facility] None  04/05/2020  9:30 AM Ezreal Turay, Melanie Crazier, MD [facility] None  04/13/2020  9:40 AM Wellington Hampshire,  MD [facility]

## 2019-12-31 NOTE — Telephone Encounter (Signed)
Received completed forms from [place], [place]. Called patient and he states he will come by our office and pick up completed forms. Placed completed forms in front office designated area for patient to pick up. Also, scanned completed forms into Epic.

## 2020-01-03 ENCOUNTER — Encounter: Payer: Self-pay | Admitting: *Deleted

## 2020-01-03 ENCOUNTER — Telehealth: Payer: Self-pay | Admitting: *Deleted

## 2020-01-03 DIAGNOSIS — Z955 Presence of coronary angioplasty implant and graft: Secondary | ICD-10-CM

## 2020-01-03 NOTE — Telephone Encounter (Signed)
Payment posted and receipt mailed to patient- TB

## 2020-01-03 NOTE — Telephone Encounter (Signed)
Called to check on pt. Out since 12/16/19.  His sugars have been running >300 mg/dl.  He saw endocrinologist last week and changed his medications. He is also having some problems with his kidneys.  He hopes to return next week.

## 2020-01-10 ENCOUNTER — Telehealth: Payer: Self-pay

## 2020-01-10 NOTE — Telephone Encounter (Signed)
Called [name] to check in as he still has not been back to rehab due to high BG. He reports trying to take it easy as his BG is still running high and him and his MD are trying new medication. Told patient we would call back in 2 weeks 12/6 to check in again.

## 2020-01-19 ENCOUNTER — Encounter: Payer: Self-pay | Admitting: *Deleted

## 2020-01-19 DIAGNOSIS — Z955 Presence of coronary angioplasty implant and graft: Secondary | ICD-10-CM

## 2020-01-19 NOTE — Progress Notes (Signed)
Cardiac Individual Treatment Plan  Patient Details  Name: [name] MRN: [id number] Date of Birth: May 27, 1960 Referring Provider:     Cardiac Rehab from 11/15/2019 in [facility] Cardiac and Pulmonary Rehab  Referring Provider Arida      Initial Encounter Date:    Cardiac Rehab from 11/15/2019 in [facility] Cardiac and Pulmonary Rehab  Date 11/15/19      Visit Diagnosis: Status post coronary artery stent placement  Patient's Home Medications on Admission:  Current Outpatient Medications:  .  aspirin EC 81 MG tablet, Take 81 mg by mouth daily., Disp: , Rfl:  .  atorvastatin (LIPITOR) 80 MG tablet, Take 80 mg by mouth daily., Disp: , Rfl:  .  carvedilol (COREG) 3.125 MG tablet, Take 1 tablet (3.125 mg total) by mouth 2 (two) times daily with a meal., Disp: 180 tablet, Rfl: 3 .  Cinnamon 500 MG capsule, Take 1,000 mg by mouth daily., Disp: , Rfl:  .  clopidogrel (PLAVIX) 75 MG tablet, Take 75 mg by mouth daily., Disp: , Rfl:  .  EPINEPHrine 0.3 mg/0.3 mL IJ SOAJ injection, Inject 0.3 mg into the muscle as needed for anaphylaxis., Disp: , Rfl:  .  famotidine (PEPCID) 20 MG tablet, Take 20 mg by mouth daily., Disp: , Rfl:  .  glipiZIDE (GLUCOTROL) 10 MG tablet, Take 2 tablets (20 mg total) by mouth 2 (two) times daily before a meal., Disp: 360 tablet, Rfl: 2 .  glucose blood test strip, Use OneTouch Verio test strips as instructed to check blood sugar twice daily. DX:E11.65, Disp: 100 each, Rfl: 3 .  hydrochlorothiazide (HYDRODIURIL) 25 MG tablet, Take 1 tablet (25 mg total) by mouth daily., Disp: , Rfl:  .  isosorbide mononitrate (IMDUR) 60 MG 24 hr tablet, Take 1.5 tablets (90 mg total) by mouth daily., Disp: 45 tablet, Rfl: 5 .  metFORMIN (GLUCOPHAGE) 1000 MG tablet, Take 1 tablet (1,000 mg total) by mouth 2 (two) times daily with a meal. Restart on 10/02/19., Disp: 180 tablet, Rfl: 3 .  nitroGLYCERIN (NITROSTAT) 0.4 MG SL tablet, Place 0.4 mg under the tongue every 5 (five) minutes as needed for  chest pain. , Disp: , Rfl:  .  pioglitazone (ACTOS) 45 MG tablet, Take 1 tablet (45 mg total) by mouth daily., Disp: 90 tablet, Rfl: 1 .  ranolazine (RANEXA) 500 MG 12 hr tablet, Take 500 mg by mouth 2 (two) times daily., Disp: , Rfl:  .  vitamin B-12 (CYANOCOBALAMIN) 1000 MCG tablet, Take 1,000 mcg by mouth daily., Disp: , Rfl:   Past Medical History: Past Medical History:  Diagnosis Date  . CKD (chronic kidney disease), stage III ([place])   . Coronary artery disease    a.  H/o NSTEMI s/p LAD/RCA stenting; b. 08/2019 PCI: RCA 99p (2.5x15 Resolute Onyx DES), 75p/m, mild ISR; c. 09/2019 PCI: 85p - heavily calcified (intravascular lithotripsy & 3.0x26 Resolute Onyx DES); d. 10/18/19 Relook cath: LM 25, LAD 69m ISR, 75d, 70/50d/apical, RI 80, LCX 50p/m/d, RCA 30p ISR, 34m ISR, 50d, RPDA 60-->Med Rx.  . Diabetes mellitus without complication ([place])   . Diastolic dysfunction    a. 08/2019 Echo: EF 50-55%, no rwma, Gr1 DD, nl RV size/fxn. Triv MR.  Marland Kitchen GERD (gastroesophageal reflux disease)   . Hyperlipidemia   . Hypertension   . NSTEMI (non-ST elevated myocardial infarction) ([place]) 08/2019  . Sleep apnea   . Tobacco abuse     Tobacco Use: Social History   Tobacco Use  Smoking Status Current Every  Day Smoker  . Packs/day: 0.50  . Years: 50.00  . Pack years: 25.00  . Types: Cigarettes  Smokeless Tobacco Never Used  Tobacco Comment   Patient states he is ready to quit    Labs: Recent Review Flowsheet Data    Labs for ITP Cardiac and Pulmonary Rehab Latest Ref Rng & Units 08/31/2019 09/01/2019 12/31/2019   Cholestrol 0 - 200 mg/dL - 227(H) 170   LDLCALC 0 - 99 mg/dL - UNABLE TO CALCULATE IF TRIGLYCERIDE OVER 400 mg/dL -   LDLDIRECT mg/dL - 71.5 74.0   HDL >39.00 mg/dL - 34(L) 35.70(L)   Trlycerides 0 - 149 mg/dL - 755(H) 434.0 Triglyceride is over 400; calculations on Lipids are invalid.(H)   Hemoglobin A1c 4.0 - 5.6 % 12.1(H) - 9.1(A)       Exercise Target Goals: Exercise Program  Goal: Individual exercise prescription set using results from initial 6 min walk test and THRR while considering  patient's activity barriers and safety.   Exercise Prescription Goal: Initial exercise prescription builds to 30-45 minutes a day of aerobic activity, 2-3 days per week.  Home exercise guidelines will be given to patient during program as part of exercise prescription that the participant will acknowledge.   Education: Aerobic Exercise: - Group verbal and visual presentation on the components of exercise prescription. Introduces F.I.T.T principle from ACSM for exercise prescriptions.  Reviews F.I.T.T. principles of aerobic exercise including progression. Written material given at graduation.   Cardiac Rehab from 12/16/2019 in [facility] Cardiac and Pulmonary Rehab  Date 12/16/19  Hilda Blades only on 10/28]  Educator [facility]  Instruction Review Code 1- [employer] Understanding      Education: Resistance Exercise: - Group verbal and visual presentation on the components of exercise prescription. Introduces F.I.T.T principle from ACSM for exercise prescriptions  Reviews F.I.T.T. principles of resistance exercise including progression. Written material given at graduation.    Education: Exercise & Equipment Safety: - Individual verbal instruction and demonstration of equipment use and safety with use of the equipment.   Cardiac Rehab from 12/16/2019 in [facility] Cardiac and Pulmonary Rehab  Date 11/15/19  Educator AS  Instruction Review Code 1- Verbalizes Understanding      Education: Exercise Physiology & General Exercise Guidelines: - Group verbal and written instruction with models to review the exercise physiology of the cardiovascular system and associated critical values. Provides general exercise guidelines with specific guidelines to those with heart or lung disease.    Cardiac Rehab from 12/16/2019 in [facility] Cardiac and Pulmonary Rehab  Date 12/02/19  Educator [facility]  Instruction Review  Code 1- Verbalizes Understanding      Education: Flexibility, Balance, Mind/Body Relaxation: - Group verbal and visual presentation with interactive activity on the components of exercise prescription. Introduces F.I.T.T principle from ACSM for exercise prescriptions. Reviews F.I.T.T. principles of flexibility and balance exercise training including progression. Also discusses the mind body connection.  Reviews various relaxation techniques to help reduce and manage stress (i.e. Deep breathing, progressive muscle relaxation, and visualization). Balance handout provided to take home. Written material given at graduation.   Activity Barriers & Risk Stratification:   6 Minute Walk:  6 Minute Walk    Row Name 11/15/19 1610         6 Minute Walk   Phase Initial     Distance 975 feet     Walk Time 6 minutes     # of Rest Breaks 0     MPH 1.85     METS 2.1  RPE 11     Perceived Dyspnea  1     VO2 Peak 7.4     Symptoms Yes (comment)     Comments hip pain 3/10     Resting HR 67 bpm     Resting BP 100/68     Resting Oxygen Saturation  97 %     Exercise Oxygen Saturation  during 6 min walk 98 %     Max Ex. HR 76 bpm     Max Ex. BP 112/56     2 Minute Post BP 92/56            Oxygen Initial Assessment:   Oxygen Re-Evaluation:   Oxygen Discharge (Final Oxygen Re-Evaluation):   Initial Exercise Prescription:  Initial Exercise Prescription - 11/15/19 1600      Date of Initial Exercise RX and Referring Provider   Date 11/15/19    Referring Provider Arida      Treadmill   MPH 1    Grade 0    Minutes 15    METs 2      Recumbant Bike   Level 1    RPM 60    Minutes 15    METs 2      NuStep   Level 1    SPM 80    Minutes 15    METs 2      REL-XR   Level 1    Speed 50    Minutes 15    METs 2      T5 Nustep   Level 1    SPM 80    Minutes 15    METs 2      Prescription Details   Frequency (times per week) 3    Duration Progress to 30 minutes of  continuous aerobic without signs/symptoms of physical distress      Intensity   THRR 40-80% of Max Heartrate 104-140    Ratings of Perceived Exertion 11-13    Perceived Dyspnea 0-4      Progression   Progression Continue to progress workloads to maintain intensity without signs/symptoms of physical distress.      Resistance Training   Training Prescription Yes    Weight 3 lb    Reps 10-15           Perform Capillary Blood Glucose checks as needed.  Exercise Prescription Changes:  Exercise Prescription Changes    Row Name 12/06/19 0800 12/20/19 1000           Response to Exercise   Blood Pressure (Admit) 108/62 112/60      Blood Pressure (Exercise) 112/68 146/64      Blood Pressure (Exit) 106/62 92/56      Heart Rate (Admit) 71 bpm 73 bpm      Heart Rate (Exercise) 82 bpm 91 bpm      Heart Rate (Exit) 73 bpm 75 bpm      Rating of Perceived Exertion (Exercise) 13 13      Symptoms none none      Comments second full day of exercise --      Duration Progress to 30 minutes of  aerobic without signs/symptoms of physical distress Continue with 30 min of aerobic exercise without signs/symptoms of physical distress.      Intensity THRR unchanged THRR unchanged        Progression   Progression Continue to progress workloads to maintain intensity without signs/symptoms of physical distress. Continue to progress workloads to maintain intensity without  signs/symptoms of physical distress.      Average METs 2.02 1.92        Resistance Training   Training Prescription Yes Yes      Weight 3 lb 3 lb      Reps 10-15 10-15        Interval Training   Interval Training No No        Treadmill   MPH 1.1 1.2      Grade 0 0      Minutes 15 15      METs 1.84 1.84        T5 Nustep   Level 1 1      SPM -- 80      Minutes 15 15      METs 2.2 2             Exercise Comments:  Exercise Comments    Row Name 11/30/19 1016           Exercise Comments First full day of  exercise!  Patient was oriented to gym and equipment including functions, settings, policies, and procedures.  Patient's individual exercise prescription and treatment plan were reviewed.  All starting workloads were established based on the results of the 6 minute walk test done at initial orientation visit.  The plan for exercise progression was also introduced and progression will be customized based on patient's performance and goals.              Exercise Goals and Review:  Exercise Goals    Row Name 11/15/19 1619             Exercise Goals   Increase Physical Activity Yes       Intervention Provide advice, education, support and counseling about physical activity/exercise needs.;Develop an individualized exercise prescription for aerobic and resistive training based on initial evaluation findings, risk stratification, comorbidities and participant's personal goals.       Expected Outcomes Short Term: Attend rehab on a regular basis to increase amount of physical activity.;Long Term: Add in home exercise to make exercise part of routine and to increase amount of physical activity.;Long Term: Exercising regularly at least 3-5 days a week.       Increase Strength and Stamina Yes       Intervention Provide advice, education, support and counseling about physical activity/exercise needs.;Develop an individualized exercise prescription for aerobic and resistive training based on initial evaluation findings, risk stratification, comorbidities and participant's personal goals.       Expected Outcomes Short Term: Increase workloads from initial exercise prescription for resistance, speed, and METs.;Short Term: Perform resistance training exercises routinely during rehab and add in resistance training at home;Long Term: Improve cardiorespiratory fitness, muscular endurance and strength as measured by increased METs and functional capacity (6MWT)       Able to understand and use rate of perceived  exertion (RPE) scale Yes       Intervention Provide education and explanation on how to use RPE scale       Expected Outcomes Short Term: Able to use RPE daily in rehab to express subjective intensity level;Long Term:  Able to use RPE to guide intensity level when exercising independently       Able to understand and use Dyspnea scale Yes       Intervention Provide education and explanation on how to use Dyspnea scale       Expected Outcomes Short Term: Able to use Dyspnea scale daily in rehab to  express subjective sense of shortness of breath during exertion;Long Term: Able to use Dyspnea scale to guide intensity level when exercising independently       Knowledge and understanding of Target Heart Rate Range (THRR) Yes       Intervention Provide education and explanation of THRR including how the numbers were predicted and where they are located for reference       Expected Outcomes Short Term: Able to state/look up THRR;Short Term: Able to use daily as guideline for intensity in rehab;Long Term: Able to use THRR to govern intensity when exercising independently       Able to check pulse independently Yes       Intervention Provide education and demonstration on how to check pulse in carotid and radial arteries.;Review the importance of being able to check your own pulse for safety during independent exercise       Expected Outcomes Short Term: Able to explain why pulse checking is important during independent exercise;Long Term: Able to check pulse independently and accurately       Understanding of Exercise Prescription Yes       Intervention Provide education, explanation, and written materials on patient's individual exercise prescription       Expected Outcomes Short Term: Able to explain program exercise prescription;Long Term: Able to explain home exercise prescription to exercise independently              Exercise Goals Re-Evaluation :  Exercise Goals Re-Evaluation    Row Name  11/30/19 1016 12/06/19 0838 12/16/19 0938 12/20/19 1044 01/03/20 1055     Exercise Goal Re-Evaluation   Exercise Goals Review Able to understand and use rate of perceived exertion (RPE) scale;Knowledge and understanding of Target Heart Rate Range (THRR);Understanding of Exercise Prescription Increase Physical Activity;Increase Strength and Stamina;Understanding of Exercise Prescription Increase Physical Activity;Increase Strength and Stamina;Understanding of Exercise Prescription Increase Physical Activity;Increase Strength and Stamina;Understanding of Exercise Prescription --   Comments Reviewed RPE and dyspnea scales, THR and program prescription with pt today.  Pt voiced understanding and was given a copy of goals to take home. Kenon is off to a good start in rehab.  He is already up to 2.2 METs on the T5 NuStep.  We will continue to monitor his progress. Tejuan walks everyday for 15 minutes at RPE 12. He has no weights at home at this time. EP has not gone over home execise with Deigo yet. Goerge has tolerated exercise well in his first sessions.  Staff will monitor progress. Out since last review   Expected Outcomes Short: Use RPE daily to regulate intensity. Long: Follow program prescription in THR. Short: Continue to attend rehab regularly Long: Continue to follow program prescription ST: increase walk to 20 minutes LT: exercise 150 minutes of moderate activity Short: review home exercise with staff Long: exercise consistently 3-5 days per week --          Discharge Exercise Prescription (Final Exercise Prescription Changes):  Exercise Prescription Changes - 12/20/19 1000      Response to Exercise   Blood Pressure (Admit) 112/60    Blood Pressure (Exercise) 146/64    Blood Pressure (Exit) 92/56    Heart Rate (Admit) 73 bpm    Heart Rate (Exercise) 91 bpm    Heart Rate (Exit) 75 bpm    Rating of Perceived Exertion (Exercise) 13    Symptoms none    Duration Continue with 30 min of  aerobic exercise without signs/symptoms of physical distress.  Intensity THRR unchanged      Progression   Progression Continue to progress workloads to maintain intensity without signs/symptoms of physical distress.    Average METs 1.92      Resistance Training   Training Prescription Yes    Weight 3 lb    Reps 10-15      Interval Training   Interval Training No      Treadmill   MPH 1.2    Grade 0    Minutes 15    METs 1.84      T5 Nustep   Level 1    SPM 80    Minutes 15    METs 2           Nutrition:  Target Goals: Understanding of nutrition guidelines, daily intake of sodium 1500mg , cholesterol 200mg , calories 30% from fat and 7% or less from saturated fats, daily to have 5 or more servings of fruits and vegetables.  Education: All About Nutrition: -Group instruction provided by verbal, written material, interactive activities, discussions, models, and posters to present general guidelines for heart healthy nutrition including fat, fiber, MyPlate, the role of sodium in heart healthy nutrition, utilization of the nutrition label, and utilization of this knowledge for meal planning. Follow up email sent as well. Written material given at graduation.   Biometrics:  Pre Biometrics - 11/15/19 1620      Pre Biometrics   Height 5' 6.5" (1.689 m)    Weight 211 lb 12.8 oz (96.1 kg)    BMI (Calculated) 33.68    Single Leg Stand 28.5 seconds            Nutrition Therapy Plan and Nutrition Goals:  Nutrition Therapy & Goals - 12/13/19 1202      Nutrition Therapy   Diet heart healthy, low Na, T2DM    Drug/Food Interactions Statins/Certain Fruits    Protein (specify units) 75g    Fiber 30 grams    Whole Grain Foods 3 servings    Saturated Fats 12 max. grams    Fruits and Vegetables 8 servings/day    Sodium 1.5 grams      Personal Nutrition Goals   Nutrition Goal ST: fruit and peanutbutter for a snack, popcorn with nuts LT: A1C <7; A1C in July was 12, 1/2  grains whole grains, follow MyPlate, 3 meals with 2 snacks, 8 fruits and vegetables, <2g Na    Comments He reports lately not eating a lot, he is afraid of having high BG. Sat. BG 190 - cajun filet biscuit and chicken stew for dinner. Just started seeing new endocronologist - will see in November. PCP put him on glipizide, new MD increased. his very high BG could be due to medication management - encouraged to let doctor know what is going on, he could be at risk for a hyperglycemic emergency. B; scrambled egg or sandwich L: sandwich for lunch, chicken sandwich take out. D: chicken, fish, corn, green beans. S: potato chips Drinks: tea - diluted (sweet tea - 1 cup of sugar per gallon) with dinner, water, sometimes diet soda. Discussed  heart healthy and diabetes friendly eating.      Intervention Plan   Intervention Prescribe, educate and counsel regarding individualized specific dietary modifications aiming towards targeted core components such as weight, hypertension, lipid management, diabetes, heart failure and other comorbidities.;Nutrition handout(s) given to patient.    Expected Outcomes Short Term Goal: Understand basic principles of dietary content, such as calories, fat, sodium, cholesterol and nutrients.;Short Term Goal:  A plan has been developed with personal nutrition goals set during dietitian appointment.;Long Term Goal: Adherence to prescribed nutrition plan.           Nutrition Assessments:  Nutrition Assessments - 11/15/19 1623      MEDFICTS Scores   Pre Score 36          MEDIFICTS Score Key:  ?70 Need to make dietary changes   40-70 Heart Healthy Diet  ? 40 Therapeutic Level Cholesterol Diet   Picture Your Plate Scores:  <30 Unhealthy dietary pattern with much room for improvement.  41-50 Dietary pattern unlikely to meet recommendations for good health and room for improvement.  51-60 More healthful dietary pattern, with some room for improvement.   >60 Healthy  dietary pattern, although there may be some specific behaviors that could be improved.    Nutrition Goals Re-Evaluation:   Nutrition Goals Discharge (Final Nutrition Goals Re-Evaluation):   Psychosocial: Target Goals: Acknowledge presence or absence of significant depression and/or stress, maximize coping skills, provide positive support system. Participant is able to verbalize types and ability to use techniques and skills needed for reducing stress and depression.   Education: Stress, Anxiety, and Depression - Group verbal and visual presentation to define topics covered.  Reviews how body is impacted by stress, anxiety, and depression.  Also discusses healthy ways to reduce stress and to treat/manage anxiety and depression.  Written material given at graduation.   Education: Sleep Hygiene -Provides group verbal and written instruction about how sleep can affect your health.  Define sleep hygiene, discuss sleep cycles and impact of sleep habits. Review good sleep hygiene tips.    Initial Review & Psychosocial Screening:  Initial Psych Review & Screening - 11/11/19 0941      Initial Review   Current issues with None Identified      Family Dynamics   Good Support System? Yes    Comments He can look to his son, daughter and fiance for support. He feels positive about his health and wants to spend more time with his grandchildren.      Barriers   Psychosocial barriers to participate in program There are no identifiable barriers or psychosocial needs.;The patient should benefit from training in stress management and relaxation.      Screening Interventions   Interventions Encouraged to exercise           Quality of Life Scores:   Quality of Life - 11/15/19 1622      Quality of Life   Select Quality of Life      Quality of Life Scores   Health/Function Pre 21.11 %    Socioeconomic Pre 28.8 %    Psych/Spiritual Pre 30 %    Family Pre 26.4 %    GLOBAL Pre 25.21 %           Scores of 19 and below usually indicate a poorer quality of life in these areas.  A difference of  2-3 points is a clinically meaningful difference.  A difference of 2-3 points in the total score of the Quality of Life Index has been associated with significant improvement in overall quality of life, self-image, physical symptoms, and general health in studies assessing change in quality of life.  PHQ-9: Recent Review Flowsheet Data    Depression screen [facility] 2/9 11/15/2019   Decreased Interest 1   Down, Depressed, Hopeless 0   PHQ - 2 Score 1   Altered sleeping 0   Tired, decreased energy 2  Change in appetite 0   Feeling bad or failure about yourself  0   Trouble concentrating 0   Moving slowly or fidgety/restless 0   Suicidal thoughts 0   PHQ-9 Score 3     Interpretation of Total Score  Total Score Depression Severity:  1-4 = Minimal depression, 5-9 = Mild depression, 10-14 = Moderate depression, 15-19 = Moderately severe depression, 20-27 = Severe depression   Psychosocial Evaluation and Intervention:  Psychosocial Evaluation - 11/11/19 0945      Psychosocial Evaluation & Interventions   Interventions Encouraged to exercise with the program and follow exercise prescription    Comments He can look to his son, daughter and fiance for support. He feels positive about his health and wants to spend more time with his grandchildren.    Expected Outcomes Short: Exercise regularly to support mental health and notify staff of any changes. Long: maintain mental health and well being through teaching of rehab or prescribed medications independently.    Continue Psychosocial Services  Follow up required by staff           Psychosocial Re-Evaluation:  Psychosocial Re-Evaluation    [place] Name 12/16/19 0941             Psychosocial Re-Evaluation   Comments He reports no stress concerns at this time. He reports his family is a good support system for him. He likes to fish to relax.        Expected Outcomes ST: continue to reduce stress by fishing with grandchildren LT: maintain positive outlook.       Interventions Encouraged to attend Cardiac Rehabilitation for the exercise       Continue Psychosocial Services  Follow up required by staff              Psychosocial Discharge (Final Psychosocial Re-Evaluation):  Psychosocial Re-Evaluation - 12/16/19 0941      Psychosocial Re-Evaluation   Comments He reports no stress concerns at this time. He reports his family is a good support system for him. He likes to fish to relax.    Expected Outcomes ST: continue to reduce stress by fishing with grandchildren LT: maintain positive outlook.    Interventions Encouraged to attend Cardiac Rehabilitation for the exercise    Continue Psychosocial Services  Follow up required by staff           Vocational Rehabilitation: Provide vocational rehab assistance to qualifying candidates.   Vocational Rehab Evaluation & Intervention:   Education: Education Goals: Education classes will be provided on a variety of topics geared toward better understanding of heart health and risk factor modification. Participant will state understanding/return demonstration of topics presented as noted by education test scores.  Learning Barriers/Preferences:  Learning Barriers/Preferences - 11/11/19 0940      Learning Barriers/Preferences   Learning Barriers None    Learning Preferences None           General Cardiac Education Topics:  AED/CPR: - Group verbal and written instruction with the use of models to demonstrate the basic use of the AED with the basic ABC's of resuscitation.   Anatomy and Cardiac Procedures: - Group verbal and visual presentation and models provide information about basic cardiac anatomy and function. Reviews the testing methods done to diagnose heart disease and the outcomes of the test results. Describes the treatment choices: Medical Management, Angioplasty,  or Coronary Bypass Surgery for treating various heart conditions including Myocardial Infarction, Angina, Valve Disease, and Cardiac Arrhythmias.  Written material given at  graduation.   Cardiac Rehab from 12/16/2019 in [facility]  Date 12/16/19  Educator SB  Instruction Review Code 1- Verbalizes Understanding      Medication Safety: - Group verbal and visual instruction to review commonly prescribed medications for heart and lung disease. Reviews the medication, class of the drug, and side effects. Includes the steps to properly store meds and maintain the prescription regimen.  Written material given at graduation.   Intimacy: - Group verbal instruction through game format to discuss how heart and lung disease can affect sexual intimacy. Written material given at graduation..   Know Your Numbers and Heart Failure: - Group verbal and visual instruction to discuss disease risk factors for cardiac and pulmonary disease and treatment options.  Reviews associated critical values for Overweight/Obesity, Hypertension, Cholesterol, and Diabetes.  Discusses basics of heart failure: signs/symptoms and treatments.  Introduces Heart Failure Zone chart for action plan for heart failure.  Written material given at graduation.   Infection Prevention: - Provides verbal and written material to individual with discussion of infection control including proper hand washing and proper equipment cleaning during exercise session.   Cardiac Rehab from 11/11/2019 in [facility] Cardiac and Pulmonary Rehab  Date 11/11/19  Educator [facility]  Instruction Review Code 1- Verbalizes Understanding      Falls Prevention: - Provides verbal and written material to individual with discussion of falls prevention and safety.   Cardiac Rehab from 12/16/2019 in [facility] Cardiac and Pulmonary Rehab  Date 11/15/19  Educator AS  Instruction Review Code 1- Verbalizes Understanding      Other: -Provides group and  verbal instruction on various topics (see comments)   Cardiac Rehab from 12/16/2019 in [facility] Cardiac and Pulmonary Rehab  Date 11/15/19  [facility] [employer] quit smoking packet]  Educator AS  Instruction Review Code 1- Verbalizes Understanding      Knowledge Questionnaire Score:  Knowledge Questionnaire Score - 11/15/19 1621      Knowledge Questionnaire Score   Pre Score 21/26 HF,exercise,nutrition           Core Components/Risk Factors/Patient Goals at Admission:  Personal Goals and Risk Factors at Admission - 11/15/19 1623      Core Components/Risk Factors/Patient Goals on Admission    Weight Management Yes;Weight Loss    Intervention Weight Management: Develop a combined nutrition and exercise program designed to reach desired caloric intake, while maintaining appropriate intake of nutrient and fiber, sodium and fats, and appropriate energy expenditure required for the weight goal.;Weight Management: Provide education and appropriate resources to help participant work on and attain dietary goals.;Weight Management/Obesity: Establish reasonable short term and long term weight goals.    Admit Weight 211 lb 12.8 oz (96.1 kg)    Goal Weight: Short Term 205 lb (93 kg)    Goal Weight: Long Term 200 lb (90.7 kg)    Expected Outcomes Short Term: Continue to assess and modify interventions until short term weight is achieved;Long Term: Adherence to nutrition and physical activity/exercise program aimed toward attainment of established weight goal;Weight Maintenance: Understanding of the daily nutrition guidelines, which includes 25-35% calories from fat, 7% or less cal from saturated fats, less than 200mg  cholesterol, less than 1.5gm of sodium, & 5 or more servings of fruits and vegetables daily;Weight Loss: Understanding of general recommendations for a balanced deficit meal plan, which promotes 1-2 lb weight loss per week and includes a negative energy balance of [phone] kcal/d;Understanding of distribution  of calorie intake throughout the day with the consumption of  4-5 meals/snacks;Understanding recommendations for meals to include 15-35% energy as protein, 25-35% energy from fat, 35-60% energy from carbohydrates, less than 200mg  of dietary cholesterol, 20-35 gm of total fiber daily    Tobacco Cessation Yes    Number of packs per day Half    Intervention Assist the participant in steps to quit. Provide individualized education and counseling about committing to Tobacco Cessation, relapse prevention, and pharmacological support that can be provided by physician.;Advice worker, assist with locating and accessing local/national Quit Smoking programs, and support quit date choice.    Expected Outcomes Short Term: Will demonstrate readiness to quit, by selecting a quit date.;Short Term: Will quit all tobacco product use, adhering to prevention of relapse plan.;Long Term: Complete abstinence from all tobacco products for at least 12 months from quit date.    Diabetes Yes    Intervention Provide education about signs/symptoms and action to take for hypo/hyperglycemia.;Provide education about proper nutrition, including hydration, and aerobic/resistive exercise prescription along with prescribed medications to achieve blood glucose in normal ranges: Fasting glucose 65-99 mg/dL    Expected Outcomes Short Term: Participant verbalizes understanding of the signs/symptoms and immediate care of hyper/hypoglycemia, proper foot care and importance of medication, aerobic/resistive exercise and nutrition plan for blood glucose control.;Long Term: Attainment of HbA1C < 7%.    Hypertension Yes    Intervention Provide education on lifestyle modifcations including regular physical activity/exercise, weight management, moderate sodium restriction and increased consumption of fresh fruit, vegetables, and low fat dairy, alcohol moderation, and smoking cessation.;Monitor prescription use compliance.    Expected  Outcomes Short Term: Continued assessment and intervention until BP is < 140/20mm HG in hypertensive participants. < 130/31mm HG in hypertensive participants with diabetes, heart failure or chronic kidney disease.;Long Term: Maintenance of blood pressure at goal levels.    Lipids Yes    Intervention Provide education and support for participant on nutrition & aerobic/resistive exercise along with prescribed medications to achieve LDL 70mg , HDL >40mg .    Expected Outcomes Short Term: Participant states understanding of desired cholesterol values and is compliant with medications prescribed. Participant is following exercise prescription and nutrition guidelines.;Long Term: Cholesterol controlled with medications as prescribed, with individualized exercise RX and with personalized nutrition plan. Value goals: LDL < 70mg , HDL > 40 mg.           Education:Diabetes - Individual verbal and written instruction to review signs/symptoms of diabetes, desired ranges of glucose level fasting, after meals and with exercise. Acknowledge that pre and post exercise glucose checks will be done for 3 sessions at entry of program.   Cardiac Rehab from 12/16/2019 in [facility] Cardiac and Pulmonary Rehab  Date 11/15/19  Educator AS  Instruction Review Code 1- Verbalizes Understanding      Core Components/Risk Factors/Patient Goals Review:   Goals and Risk Factor Review    Row Name 12/16/19 [phone]             Core Components/Risk Factors/Patient Goals Review   Personal Goals Review Weight Management/Obesity;Hypertension;Diabetes       Review [place] reports weight relatively stable since beginning program with maybe a couple of pounds lost. RD just spoke with pt regarding nutrition less than a week ago, still working on changes. Glenford reports his BP has been good at home and is similar to what we get in rehab (today 112/60). He is taking his medication as prescribed. His BG has been running high 200's - believe this  is medication related as on Trulicity his BG was  better, now he is on glipizide and metformin and his BG has been running high. RD discussed nutritional changes to help with BG. Isidro has an appointment with his doctor on Wednesday.       Expected Outcomes ST: see doctor, continue to take medication as prescibed and BP, continue to work on exercise and nutrition changes. LT: BG less than 200 in AM              Core Components/Risk Factors/Patient Goals at Discharge (Final Review):   Goals and Risk Factor Review - 12/16/19 0942      Core Components/Risk Factors/Patient Goals Review   Personal Goals Review Weight Management/Obesity;Hypertension;Diabetes    Review [place] reports weight relatively stable since beginning program with maybe a couple of pounds lost. RD just spoke with pt regarding nutrition less than a week ago, still working on changes. Tripp reports his BP has been good at home and is similar to what we get in rehab (today 112/60). He is taking his medication as prescribed. His BG has been running high 200's - believe this is medication related as on Trulicity his BG was better, now he is on glipizide and metformin and his BG has been running high. RD discussed nutritional changes to help with BG. Yaser has an appointment with his doctor on Wednesday.    Expected Outcomes ST: see doctor, continue to take medication as prescibed and BP, continue to work on exercise and nutrition changes. LT: BG less than 200 in AM           ITP Comments:  ITP Comments    Row Name 11/11/19 6283 11/15/19 1629 11/24/19 0559 11/30/19 1015 12/13/19 1241   ITP Comments Virtual Visit completed. Patient informed on EP and RD appointment and 6 Minute walk test. Patient also informed of patient health questionnaires on My Chart. Patient Verbalizes understanding. Visit diagnosis can be found in [facility] 10/18/[facility]. Completed 6MWT and gym orientation. Initial ITP created and sent for review to Dr. Emily Filbert,  Medical Director. 30 Day review completed. Medical Director ITP review done, changes made as directed, and signed approval by Medical Director. First full day of exercise!  Patient was oriented to gym and equipment including functions, settings, policies, and procedures.  Patient's individual exercise prescription and treatment plan were reviewed.  All starting workloads were established based on the results of the 6 minute walk test done at initial orientation visit.  The plan for exercise progression was also introduced and progression will be customized based on patient's performance and goals. Completed initial RD evaluation.   Row Name 12/22/19 6629 12/22/19 0659 01/03/20 1054 01/11/20 0843 01/19/20 1022   ITP Comments Jeromy has had blood glucose levels above 300 several days. Has been sent home to review this with his physician. 30 Day review completed. Medical Director ITP review done, changes made as directed, and signed approval by Medical Director. Called to check on pt. Out since 12/16/19.  His sugars have been running >300 mg/dl.  He saw endocrinologist last week and changed his medications. He is also having some problems with his kidneys.  He hopes to return next week. Lalo has been out since 12/16/19. His blood sugars are still running high even after his MD appointment. He is working together with his doctor to fix this and try new medication. We will call to check in in two weeks- 01/24/20. Unable to complete goals/re-evaluations at this time. 30 Day review completed. Medical Director ITP review done, changes made as  directed, and signed approval by Medical Director. no visits in November          Comments:

## 2020-01-24 ENCOUNTER — Telehealth: Payer: Self-pay

## 2020-01-24 NOTE — Telephone Encounter (Signed)
Called [name] to check in - he and his MD were working on his medication to better manage his BG. Told him I would give him a call back on 12/6 (2 weeks) to check in. He has not been to rehab since 10/28.

## 2020-01-25 ENCOUNTER — Encounter: Payer: 59 | Attending: Cardiovascular Disease

## 2020-01-25 DIAGNOSIS — Z955 Presence of coronary angioplasty implant and graft: Secondary | ICD-10-CM | POA: Insufficient documentation

## 2020-01-27 ENCOUNTER — Telehealth: Payer: Self-pay | Admitting: Cardiovascular Disease

## 2020-01-27 NOTE — Telephone Encounter (Signed)
Spoke with the patient. Patient sts that he lost his health insurance and sts that he is unsure what to do about his upcoming visits and medications.  Adv the patient that the medications we prescribe are generic. He could call his local pharmacies to see who offers the best price. Suggested that he try [facility].  Ad the patient that who do see uninsured patients. He would be billed for his office visit and testing. Adv the patient that [facility] may offer discounted rates for patients that are uninsured. Adv the patient that I will find out more info regarding that option and call him back.  Patient was very appreciative for the assistance.

## 2020-01-27 NOTE — Telephone Encounter (Signed)
Patient is calling in stating his insurance was dropped and it now wondering what can be done about his medications and the cost  Please advise

## 2020-01-28 NOTE — Telephone Encounter (Signed)
Lmom. I have placed in the mail the application financial assistance and medication management. If the patient is interested he should complete the applications and mail them back to the addresses listed on the applications.

## 2020-01-31 ENCOUNTER — Telehealth: Payer: Self-pay

## 2020-01-31 DIAGNOSIS — Z955 Presence of coronary angioplasty implant and graft: Secondary | ICD-10-CM

## 2020-01-31 NOTE — Telephone Encounter (Signed)
Called [name] to check in regarding his blood sugar and returning to rehab. He reports his blood sugar is now around 130 and is doing well, however, he has just lost his insurance and is trying to get coverage. He reports he should hear back in a couple of days. We will hold his spot until the beginning of the year- at that time if he cannot return we will have to discharge him at that time.

## 2020-02-08 ENCOUNTER — Other Ambulatory Visit: Payer: Self-pay | Admitting: Internal Medicine

## 2020-02-08 DIAGNOSIS — E1165 Type 2 diabetes mellitus with hyperglycemia: Secondary | ICD-10-CM

## 2020-02-09 ENCOUNTER — Telehealth: Payer: Self-pay

## 2020-02-09 NOTE — Telephone Encounter (Signed)
Called [name] to check in regarding his insurance, he has still not heard anything and is going to try again today. We will check in again next week. His new insurance may not cover rehab as it may be considered pre-existing - At that time we could look at getting him in under Pulmonary Rehab if he qualifies. If we don't know anything before the new year we would have to discharge him at that time.

## 2020-02-14 ENCOUNTER — Telehealth: Payer: Self-pay | Admitting: Cardiovascular Disease

## 2020-02-14 NOTE — Telephone Encounter (Signed)
Office received forms from [employer]. Patient was called to be notified forms were received and instructed to bring in the $29 fee for the process to begin. Forms placed in pink notebook

## 2020-02-15 DIAGNOSIS — Z0279 Encounter for issue of other medical certificate: Secondary | ICD-10-CM

## 2020-02-15 NOTE — Telephone Encounter (Signed)
Patient made payment on forms. Forms were scanned into chart and billing form sent to billing. Placed in nurse box

## 2020-02-15 NOTE — Telephone Encounter (Signed)
Forms placed in providers in basket on his desk.

## 2020-02-16 ENCOUNTER — Encounter: Payer: Self-pay | Admitting: *Deleted

## 2020-02-16 ENCOUNTER — Telehealth: Payer: Self-pay

## 2020-02-16 DIAGNOSIS — Z955 Presence of coronary angioplasty implant and graft: Secondary | ICD-10-CM

## 2020-02-16 NOTE — Progress Notes (Signed)
Cardiac Individual Treatment Plan  Patient Details  Name: [name] MRN: [id number] Date of Birth: March 12, 1960 Referring Provider:   Flowsheet Row Cardiac Rehab from 11/15/2019 in [facility] Cardiac and Pulmonary Rehab  Referring Provider Arida      Initial Encounter Date:  Flowsheet Row Cardiac Rehab from 11/15/2019 in [facility] Cardiac and Pulmonary Rehab  Date 11/15/19      Visit Diagnosis: Status post coronary artery stent placement  Patient's Home Medications on Admission:  Current Outpatient Medications:  .  aspirin EC 81 MG tablet, Take 81 mg by mouth daily., Disp: , Rfl:  .  atorvastatin (LIPITOR) 80 MG tablet, Take 80 mg by mouth daily., Disp: , Rfl:  .  carvedilol (COREG) 3.125 MG tablet, Take 1 tablet (3.125 mg total) by mouth 2 (two) times daily with a meal., Disp: 180 tablet, Rfl: 3 .  Cinnamon 500 MG capsule, Take 1,000 mg by mouth daily., Disp: , Rfl:  .  clopidogrel (PLAVIX) 75 MG tablet, Take 75 mg by mouth daily., Disp: , Rfl:  .  EPINEPHrine 0.3 mg/0.3 mL IJ SOAJ injection, Inject 0.3 mg into the muscle as needed for anaphylaxis., Disp: , Rfl:  .  famotidine (PEPCID) 20 MG tablet, Take 20 mg by mouth daily., Disp: , Rfl:  .  glipiZIDE (GLUCOTROL) 10 MG tablet, Take 2 tablets (20 mg total) by mouth 2 (two) times daily before a meal., Disp: 360 tablet, Rfl: 2 .  glucose blood test strip, Use OneTouch Verio test strips as instructed to check blood sugar twice daily. DX:E11.65, Disp: 100 each, Rfl: 3 .  hydrochlorothiazide (HYDRODIURIL) 25 MG tablet, Take 1 tablet (25 mg total) by mouth daily., Disp: , Rfl:  .  isosorbide mononitrate (IMDUR) 60 MG 24 hr tablet, Take 1.5 tablets (90 mg total) by mouth daily., Disp: 45 tablet, Rfl: 5 .  metFORMIN (GLUCOPHAGE) 1000 MG tablet, Take 1 tablet (1,000 mg total) by mouth 2 (two) times daily with a meal. Restart on 10/02/19., Disp: 180 tablet, Rfl: 3 .  nitroGLYCERIN (NITROSTAT) 0.4 MG SL tablet, Place 0.4 mg under the tongue every 5  (five) minutes as needed for chest pain. , Disp: , Rfl:  .  pioglitazone (ACTOS) 45 MG tablet, Take 1 tablet (45 mg total) by mouth daily., Disp: 90 tablet, Rfl: 1 .  ranolazine (RANEXA) 500 MG 12 hr tablet, Take 500 mg by mouth 2 (two) times daily., Disp: , Rfl:  .  vitamin B-12 (CYANOCOBALAMIN) 1000 MCG tablet, Take 1,000 mcg by mouth daily., Disp: , Rfl:   Past Medical History: Past Medical History:  Diagnosis Date  . CKD (chronic kidney disease), stage III ([place])   . Coronary artery disease    a.  H/o NSTEMI s/p LAD/RCA stenting; b. 08/2019 PCI: RCA 99p (2.5x15 Resolute Onyx DES), 75p/m, mild ISR; c. 09/2019 PCI: 85p - heavily calcified (intravascular lithotripsy & 3.0x26 Resolute Onyx DES); d. 10/18/19 Relook cath: LM 25, LAD 17m ISR, 75d, 70/50d/apical, RI 80, LCX 50p/m/d, RCA 30p ISR, 31m ISR, 50d, RPDA 60-->Med Rx.  . Diabetes mellitus without complication ([place])   . Diastolic dysfunction    a. 08/2019 Echo: EF 50-55%, no rwma, Gr1 DD, nl RV size/fxn. Triv MR.  Marland Kitchen GERD (gastroesophageal reflux disease)   . Hyperlipidemia   . Hypertension   . NSTEMI (non-ST elevated myocardial infarction) ([place]) 08/2019  . Sleep apnea   . Tobacco abuse     Tobacco Use: Social History   Tobacco Use  Smoking Status Current Every  Day Smoker  . Packs/day: 0.50  . Years: 50.00  . Pack years: 25.00  . Types: Cigarettes  Smokeless Tobacco Never Used  Tobacco Comment   Patient states he is ready to quit    Labs: Recent Review Flowsheet Data    Labs for ITP Cardiac and Pulmonary Rehab Latest Ref Rng & Units 08/31/2019 09/01/2019 12/31/2019   Cholestrol 0 - 200 mg/dL - 227(H) 170   LDLCALC 0 - 99 mg/dL - UNABLE TO CALCULATE IF TRIGLYCERIDE OVER 400 mg/dL -   LDLDIRECT mg/dL - 71.5 74.0   HDL >39.00 mg/dL - 34(L) 35.70(L)   Trlycerides 0.0 - 149.0 mg/dL - 755(H) 434.0 Triglyceride is over 400; calculations on Lipids are invalid.(H)   Hemoglobin A1c 4.0 - 5.6 % 12.1(H) - 9.1(A)       Exercise Target  Goals: Exercise Program Goal: Individual exercise prescription set using results from initial 6 min walk test and THRR while considering  patient's activity barriers and safety.   Exercise Prescription Goal: Initial exercise prescription builds to 30-45 minutes a day of aerobic activity, 2-3 days per week.  Home exercise guidelines will be given to patient during program as part of exercise prescription that the participant will acknowledge.   Education: Aerobic Exercise: - Group verbal and visual presentation on the components of exercise prescription. Introduces F.I.T.T principle from ACSM for exercise prescriptions.  Reviews F.I.T.T. principles of aerobic exercise including progression. Written material given at graduation. Flowsheet Row Cardiac Rehab from 12/16/2019 in [facility] Cardiac and Pulmonary Rehab  Date 12/16/19  Hilda Blades only on 10/28]  Educator [facility]  Instruction Review Code 1- [employer] Understanding      Education: Resistance Exercise: - Group verbal and visual presentation on the components of exercise prescription. Introduces F.I.T.T principle from ACSM for exercise prescriptions  Reviews F.I.T.T. principles of resistance exercise including progression. Written material given at graduation.    Education: Exercise & Equipment Safety: - Individual verbal instruction and demonstration of equipment use and safety with use of the equipment. Flowsheet Row Cardiac Rehab from 12/16/2019 in [facility] Cardiac and Pulmonary Rehab  Date 11/15/19  Educator AS  Instruction Review Code 1- Verbalizes Understanding      Education: Exercise Physiology & General Exercise Guidelines: - Group verbal and written instruction with models to review the exercise physiology of the cardiovascular system and associated critical values. Provides general exercise guidelines with specific guidelines to those with heart or lung disease.  Flowsheet Row Cardiac Rehab from 12/16/2019 in [facility] Cardiac and  Pulmonary Rehab  Date 12/02/19  Educator [facility]  Instruction Review Code 1- Verbalizes Understanding      Education: Flexibility, Balance, Mind/Body Relaxation: - Group verbal and visual presentation with interactive activity on the components of exercise prescription. Introduces F.I.T.T principle from ACSM for exercise prescriptions. Reviews F.I.T.T. principles of flexibility and balance exercise training including progression. Also discusses the mind body connection.  Reviews various relaxation techniques to help reduce and manage stress (i.e. Deep breathing, progressive muscle relaxation, and visualization). Balance handout provided to take home. Written material given at graduation.   Activity Barriers & Risk Stratification:   6 Minute Walk:  6 Minute Walk    Row Name 11/15/19 1610         6 Minute Walk   Phase Initial     Distance 975 feet     Walk Time 6 minutes     # of Rest Breaks 0     MPH 1.85     METS 2.1  RPE 11     Perceived Dyspnea  1     VO2 Peak 7.4     Symptoms Yes (comment)     Comments hip pain 3/10     Resting HR 67 bpm     Resting BP 100/68     Resting Oxygen Saturation  97 %     Exercise Oxygen Saturation  during 6 min walk 98 %     Max Ex. HR 76 bpm     Max Ex. BP 112/56     2 Minute Post BP 92/56            Oxygen Initial Assessment:   Oxygen Re-Evaluation:   Oxygen Discharge (Final Oxygen Re-Evaluation):   Initial Exercise Prescription:  Initial Exercise Prescription - 11/15/19 1600      Date of Initial Exercise RX and Referring Provider   Date 11/15/19    Referring Provider Arida      Treadmill   MPH 1    Grade 0    Minutes 15    METs 2      Recumbant Bike   Level 1    RPM 60    Minutes 15    METs 2      NuStep   Level 1    SPM 80    Minutes 15    METs 2      REL-XR   Level 1    Speed 50    Minutes 15    METs 2      T5 Nustep   Level 1    SPM 80    Minutes 15    METs 2      Prescription Details    Frequency (times per week) 3    Duration Progress to 30 minutes of continuous aerobic without signs/symptoms of physical distress      Intensity   THRR 40-80% of Max Heartrate 104-140    Ratings of Perceived Exertion 11-13    Perceived Dyspnea 0-4      Progression   Progression Continue to progress workloads to maintain intensity without signs/symptoms of physical distress.      Resistance Training   Training Prescription Yes    Weight 3 lb    Reps 10-15           Perform Capillary Blood Glucose checks as needed.  Exercise Prescription Changes:  Exercise Prescription Changes    Row Name 12/06/19 0800 12/20/19 1000           Response to Exercise   Blood Pressure (Admit) 108/62 112/60      Blood Pressure (Exercise) 112/68 146/64      Blood Pressure (Exit) 106/62 92/56      Heart Rate (Admit) 71 bpm 73 bpm      Heart Rate (Exercise) 82 bpm 91 bpm      Heart Rate (Exit) 73 bpm 75 bpm      Rating of Perceived Exertion (Exercise) 13 13      Symptoms none none      Comments second full day of exercise --      Duration Progress to 30 minutes of  aerobic without signs/symptoms of physical distress Continue with 30 min of aerobic exercise without signs/symptoms of physical distress.      Intensity THRR unchanged THRR unchanged             Progression   Progression Continue to progress workloads to maintain intensity without signs/symptoms of physical distress. Continue to progress  workloads to maintain intensity without signs/symptoms of physical distress.      Average METs 2.02 1.92             Resistance Training   Training Prescription Yes Yes      Weight 3 lb 3 lb      Reps 10-15 10-15             Interval Training   Interval Training No No             Treadmill   MPH 1.1 1.2      Grade 0 0      Minutes 15 15      METs 1.84 1.84             T5 Nustep   Level 1 1      SPM -- 80      Minutes 15 15      METs 2.2 2             Exercise Comments:   Exercise Comments    Row Name 11/30/19 1016           Exercise Comments First full day of exercise!  Patient was oriented to gym and equipment including functions, settings, policies, and procedures.  Patient's individual exercise prescription and treatment plan were reviewed.  All starting workloads were established based on the results of the 6 minute walk test done at initial orientation visit.  The plan for exercise progression was also introduced and progression will be customized based on patient's performance and goals.              Exercise Goals and Review:  Exercise Goals    Row Name 11/15/19 1619             Exercise Goals   Increase Physical Activity Yes       Intervention Provide advice, education, support and counseling about physical activity/exercise needs.;Develop an individualized exercise prescription for aerobic and resistive training based on initial evaluation findings, risk stratification, comorbidities and participant's personal goals.       Expected Outcomes Short Term: Attend rehab on a regular basis to increase amount of physical activity.;Long Term: Add in home exercise to make exercise part of routine and to increase amount of physical activity.;Long Term: Exercising regularly at least 3-5 days a week.       Increase Strength and Stamina Yes       Intervention Provide advice, education, support and counseling about physical activity/exercise needs.;Develop an individualized exercise prescription for aerobic and resistive training based on initial evaluation findings, risk stratification, comorbidities and participant's personal goals.       Expected Outcomes Short Term: Increase workloads from initial exercise prescription for resistance, speed, and METs.;Short Term: Perform resistance training exercises routinely during rehab and add in resistance training at home;Long Term: Improve cardiorespiratory fitness, muscular endurance and strength as measured by  increased METs and functional capacity (6MWT)       Able to understand and use rate of perceived exertion (RPE) scale Yes       Intervention Provide education and explanation on how to use RPE scale       Expected Outcomes Short Term: Able to use RPE daily in rehab to express subjective intensity level;Long Term:  Able to use RPE to guide intensity level when exercising independently       Able to understand and use Dyspnea scale Yes       Intervention Provide education and explanation  on how to use Dyspnea scale       Expected Outcomes Short Term: Able to use Dyspnea scale daily in rehab to express subjective sense of shortness of breath during exertion;Long Term: Able to use Dyspnea scale to guide intensity level when exercising independently       Knowledge and understanding of Target Heart Rate Range (THRR) Yes       Intervention Provide education and explanation of THRR including how the numbers were predicted and where they are located for reference       Expected Outcomes Short Term: Able to state/look up THRR;Short Term: Able to use daily as guideline for intensity in rehab;Long Term: Able to use THRR to govern intensity when exercising independently       Able to check pulse independently Yes       Intervention Provide education and demonstration on how to check pulse in carotid and radial arteries.;Review the importance of being able to check your own pulse for safety during independent exercise       Expected Outcomes Short Term: Able to explain why pulse checking is important during independent exercise;Long Term: Able to check pulse independently and accurately       Understanding of Exercise Prescription Yes       Intervention Provide education, explanation, and written materials on patient's individual exercise prescription       Expected Outcomes Short Term: Able to explain program exercise prescription;Long Term: Able to explain home exercise prescription to exercise independently               Exercise Goals Re-Evaluation :  Exercise Goals Re-Evaluation    Row Name 11/30/19 1016 12/06/19 0838 12/16/19 0938 12/20/19 1044 01/03/20 1055     Exercise Goal Re-Evaluation   Exercise Goals Review Able to understand and use rate of perceived exertion (RPE) scale;Knowledge and understanding of Target Heart Rate Range (THRR);Understanding of Exercise Prescription Increase Physical Activity;Increase Strength and Stamina;Understanding of Exercise Prescription Increase Physical Activity;Increase Strength and Stamina;Understanding of Exercise Prescription Increase Physical Activity;Increase Strength and Stamina;Understanding of Exercise Prescription --   Comments Reviewed RPE and dyspnea scales, THR and program prescription with pt today.  Pt voiced understanding and was given a copy of goals to take home. [name] is off to a good start in rehab.  He is already up to 2.2 METs on the T5 NuStep.  We will continue to monitor his progress. [name] everyday for 15 minutes at RPE 12. He has no weights at home at this time. EP has not gone over home execise with [name] yet. [name] has tolerated exercise well in his first sessions.  Staff will monitor progress. Out since last review   Expected Outcomes Short: Use RPE daily to regulate intensity. Long: Follow program prescription in THR. Short: Continue to attend rehab regularly Long: Continue to follow program prescription ST: increase walk to 20 minutes LT: exercise 150 minutes of moderate activity Short: review home exercise with staff Long: exercise consistently 3-5 days per week --   [name] Name 02/01/20 0811             Exercise Goal Re-Evaluation   Comments Out since last review              Discharge Exercise Prescription (Final Exercise Prescription Changes):  Exercise Prescription Changes - 12/20/19 1000      Response to Exercise   Blood Pressure (Admit) 112/60    Blood Pressure (Exercise) 146/64    Blood Pressure (Exit) 92/56  Heart Rate (Admit) 73 bpm    Heart Rate (Exercise) 91 bpm    Heart Rate (Exit) 75 bpm    Rating of Perceived Exertion (Exercise) 13    Symptoms none    Duration Continue with 30 min of aerobic exercise without signs/symptoms of physical distress.    Intensity THRR unchanged      Progression   Progression Continue to progress workloads to maintain intensity without signs/symptoms of physical distress.    Average METs 1.92      Resistance Training   Training Prescription Yes    Weight 3 lb    Reps 10-15      Interval Training   Interval Training No      Treadmill   MPH 1.2    Grade 0    Minutes 15    METs 1.84      T5 Nustep   Level 1    SPM 80    Minutes 15    METs 2           Nutrition:  Target Goals: Understanding of nutrition guidelines, daily intake of sodium 1500mg , cholesterol 200mg , calories 30% from fat and 7% or less from saturated fats, daily to have 5 or more servings of fruits and vegetables.  Education: All About Nutrition: -Group instruction provided by verbal, written material, interactive activities, discussions, models, and posters to present general guidelines for heart healthy nutrition including fat, fiber, MyPlate, the role of sodium in heart healthy nutrition, utilization of the nutrition label, and utilization of this knowledge for meal planning. Follow up email sent as well. Written material given at graduation.   Biometrics:  Pre Biometrics - 11/15/19 1620      Pre Biometrics   Height 5' 6.5" (1.689 m)    Weight 211 lb 12.8 oz (96.1 kg)    BMI (Calculated) 33.68    Single Leg Stand 28.5 seconds            Nutrition Therapy Plan and Nutrition Goals:  Nutrition Therapy & Goals - 12/13/19 1202      Nutrition Therapy   Diet heart healthy, low Na, T2DM    Drug/Food Interactions Statins/Certain Fruits    Protein (specify units) 75g    Fiber 30 grams    Whole Grain Foods 3 servings    Saturated Fats 12 max. grams    Fruits and  Vegetables 8 servings/day    Sodium 1.5 grams      Personal Nutrition Goals   Nutrition Goal ST: fruit and peanutbutter for a snack, popcorn with nuts LT: A1C <7; A1C in July was 12, 1/2 grains whole grains, follow MyPlate, 3 meals with 2 snacks, 8 fruits and vegetables, <2g Na    Comments He reports lately not eating a lot, he is afraid of having high BG. Sat. BG 190 - cajun filet biscuit and chicken stew for dinner. Just started seeing new endocronologist - will see in November. PCP put him on glipizide, new MD increased. his very high BG could be due to medication management - encouraged to let doctor know what is going on, he could be at risk for a hyperglycemic emergency. B; scrambled egg or sandwich L: sandwich for lunch, chicken sandwich take out. D: chicken, fish, corn, green beans. S: potato chips Drinks: tea - diluted (sweet tea - 1 cup of sugar per gallon) with dinner, water, sometimes diet soda. Discussed  heart healthy and diabetes friendly eating.      Intervention Plan   Intervention  Prescribe, educate and counsel regarding individualized specific dietary modifications aiming towards targeted core components such as weight, hypertension, lipid management, diabetes, heart failure and other comorbidities.;Nutrition handout(s) given to patient.    Expected Outcomes Short Term Goal: Understand basic principles of dietary content, such as calories, fat, sodium, cholesterol and nutrients.;Short Term Goal: A plan has been developed with personal nutrition goals set during dietitian appointment.;Long Term Goal: Adherence to prescribed nutrition plan.           Nutrition Assessments:  Nutrition Assessments - 11/15/19 1623      MEDFICTS Scores   Pre Score 36          MEDIFICTS Score Key:  ?70 Need to make dietary changes   40-70 Heart Healthy Diet  ? 40 Therapeutic Level Cholesterol Diet   Picture Your Plate Scores:  <51 Unhealthy dietary pattern with much room for  improvement.  41-50 Dietary pattern unlikely to meet recommendations for good health and room for improvement.  51-60 More healthful dietary pattern, with some room for improvement.   >60 Healthy dietary pattern, although there may be some specific behaviors that could be improved.    Nutrition Goals Re-Evaluation:   Nutrition Goals Discharge (Final Nutrition Goals Re-Evaluation):   Psychosocial: Target Goals: Acknowledge presence or absence of significant depression and/or stress, maximize coping skills, provide positive support system. Participant is able to verbalize types and ability to use techniques and skills needed for reducing stress and depression.   Education: Stress, Anxiety, and Depression - Group verbal and visual presentation to define topics covered.  Reviews how body is impacted by stress, anxiety, and depression.  Also discusses healthy ways to reduce stress and to treat/manage anxiety and depression.  Written material given at graduation.   Education: Sleep Hygiene -Provides group verbal and written instruction about how sleep can affect your health.  Define sleep hygiene, discuss sleep cycles and impact of sleep habits. Review good sleep hygiene tips.    Initial Review & Psychosocial Screening:  Initial Psych Review & Screening - 11/11/19 0941      Initial Review   Current issues with None Identified      Family Dynamics   Good Support System? Yes    Comments He can look to his son, daughter and fiance for support. He feels positive about his health and wants to spend more time with his grandchildren.      Barriers   Psychosocial barriers to participate in program There are no identifiable barriers or psychosocial needs.;The patient should benefit from training in stress management and relaxation.      Screening Interventions   Interventions Encouraged to exercise           Quality of Life Scores:   Quality of Life - 11/15/19 1622      Quality of  Life   Select Quality of Life      Quality of Life Scores   Health/Function Pre 21.11 %    Socioeconomic Pre 28.8 %    Psych/Spiritual Pre 30 %    Family Pre 26.4 %    GLOBAL Pre 25.21 %          Scores of 19 and below usually indicate a poorer quality of life in these areas.  A difference of  2-3 points is a clinically meaningful difference.  A difference of 2-3 points in the total score of the Quality of Life Index has been associated with significant improvement in overall quality of life, self-image, physical symptoms, and  general health in studies assessing change in quality of life.  PHQ-9: Recent Review Flowsheet Data    Depression screen [facility] 2/9 11/15/2019   Decreased Interest 1   Down, Depressed, Hopeless 0   PHQ - 2 Score 1   Altered sleeping 0   Tired, decreased energy 2   Change in appetite 0   Feeling bad or failure about yourself  0   Trouble concentrating 0   Moving slowly or fidgety/restless 0   Suicidal thoughts 0   PHQ-9 Score 3     Interpretation of Total Score  Total Score Depression Severity:  1-4 = Minimal depression, 5-9 = Mild depression, 10-14 = Moderate depression, 15-19 = Moderately severe depression, 20-27 = Severe depression   Psychosocial Evaluation and Intervention:  Psychosocial Evaluation - 11/11/19 0945      Psychosocial Evaluation & Interventions   Interventions Encouraged to exercise with the program and follow exercise prescription    Comments He can look to his son, daughter and fiance for support. He feels positive about his health and wants to spend more time with his grandchildren.    Expected Outcomes Short: Exercise regularly to support mental health and notify staff of any changes. Long: maintain mental health and well being through teaching of rehab or prescribed medications independently.    Continue Psychosocial Services  Follow up required by staff           Psychosocial Re-Evaluation:  Psychosocial Re-Evaluation    [place]  Name 12/16/19 0941             Psychosocial Re-Evaluation   Comments He reports no stress concerns at this time. He reports his family is a good support system for him. He likes to fish to relax.       Expected Outcomes ST: continue to reduce stress by fishing with grandchildren LT: maintain positive outlook.       Interventions Encouraged to attend Cardiac Rehabilitation for the exercise       Continue Psychosocial Services  Follow up required by staff              Psychosocial Discharge (Final Psychosocial Re-Evaluation):  Psychosocial Re-Evaluation - 12/16/19 0941      Psychosocial Re-Evaluation   Comments He reports no stress concerns at this time. He reports his family is a good support system for him. He likes to fish to relax.    Expected Outcomes ST: continue to reduce stress by fishing with grandchildren LT: maintain positive outlook.    Interventions Encouraged to attend Cardiac Rehabilitation for the exercise    Continue Psychosocial Services  Follow up required by staff           Vocational Rehabilitation: Provide vocational rehab assistance to qualifying candidates.   Vocational Rehab Evaluation & Intervention:   Education: Education Goals: Education classes will be provided on a variety of topics geared toward better understanding of heart health and risk factor modification. Participant will state understanding/return demonstration of topics presented as noted by education test scores.  Learning Barriers/Preferences:  Learning Barriers/Preferences - 11/11/19 0940      Learning Barriers/Preferences   Learning Barriers None    Learning Preferences None           General Cardiac Education Topics:  AED/CPR: - Group verbal and written instruction with the use of models to demonstrate the basic use of the AED with the basic ABC's of resuscitation.   Anatomy and Cardiac Procedures: - Group verbal and visual presentation and models  provide information  about basic cardiac anatomy and function. Reviews the testing methods done to diagnose heart disease and the outcomes of the test results. Describes the treatment choices: Medical Management, Angioplasty, or Coronary Bypass Surgery for treating various heart conditions including Myocardial Infarction, Angina, Valve Disease, and Cardiac Arrhythmias.  Written material given at graduation. Flowsheet Row Cardiac Rehab from 12/16/2019 in [facility] Cardiac and Pulmonary Rehab  Date 12/16/19  Educator SB  Instruction Review Code 1- Verbalizes Understanding      Medication Safety: - Group verbal and visual instruction to review commonly prescribed medications for heart and lung disease. Reviews the medication, class of the drug, and side effects. Includes the steps to properly store meds and maintain the prescription regimen.  Written material given at graduation.   Intimacy: - Group verbal instruction through game format to discuss how heart and lung disease can affect sexual intimacy. Written material given at graduation..   Know Your Numbers and Heart Failure: - Group verbal and visual instruction to discuss disease risk factors for cardiac and pulmonary disease and treatment options.  Reviews associated critical values for Overweight/Obesity, Hypertension, Cholesterol, and Diabetes.  Discusses basics of heart failure: signs/symptoms and treatments.  Introduces Heart Failure Zone chart for action plan for heart failure.  Written material given at graduation.   Infection Prevention: - Provides verbal and written material to individual with discussion of infection control including proper hand washing and proper equipment cleaning during exercise session. Flowsheet Row Cardiac Rehab from 11/11/2019 in [facility] Cardiac and Pulmonary Rehab  Date 11/11/19  Educator [facility]  Instruction Review Code 1- Verbalizes Understanding      Falls Prevention: - Provides verbal and written material to individual with  discussion of falls prevention and safety. Flowsheet Row Cardiac Rehab from 12/16/2019 in [facility] Cardiac and Pulmonary Rehab  Date 11/15/19  Educator AS  Instruction Review Code 1- Verbalizes Understanding      Other: -Provides group and verbal instruction on various topics (see comments) Flowsheet Row Cardiac Rehab from 12/16/2019 in [facility] Cardiac and Pulmonary Rehab  Date 11/15/19  [place] [facility] quit smoking packet]  Educator AS  Instruction Review Code 1- Verbalizes Understanding      Knowledge Questionnaire Score:  Knowledge Questionnaire Score - 11/15/19 1621      Knowledge Questionnaire Score   Pre Score 21/26 HF,exercise,nutrition           Core Components/Risk Factors/Patient Goals at Admission:  Personal Goals and Risk Factors at Admission - 11/15/19 1623      Core Components/Risk Factors/Patient Goals on Admission    Weight Management Yes;Weight Loss    Intervention Weight Management: Develop a combined nutrition and exercise program designed to reach desired caloric intake, while maintaining appropriate intake of nutrient and fiber, sodium and fats, and appropriate energy expenditure required for the weight goal.;Weight Management: Provide education and appropriate resources to help participant work on and attain dietary goals.;Weight Management/Obesity: Establish reasonable short term and long term weight goals.    Admit Weight 211 lb 12.8 oz (96.1 kg)    Goal Weight: Short Term 205 lb (93 kg)    Goal Weight: Long Term 200 lb (90.7 kg)    Expected Outcomes Short Term: Continue to assess and modify interventions until short term weight is achieved;Long Term: Adherence to nutrition and physical activity/exercise program aimed toward attainment of established weight goal;Weight Maintenance: Understanding of the daily nutrition guidelines, which includes 25-35% calories from fat, 7% or less cal from saturated fats, less than 200mg  cholesterol, less  than 1.5gm of sodium, & 5 or more  servings of fruits and vegetables daily;Weight Loss: Understanding of general recommendations for a balanced deficit meal plan, which promotes 1-2 lb weight loss per week and includes a negative energy balance of [phone] kcal/d;Understanding of distribution of calorie intake throughout the day with the consumption of 4-5 meals/snacks;Understanding recommendations for meals to include 15-35% energy as protein, 25-35% energy from fat, 35-60% energy from carbohydrates, less than 200mg  of dietary cholesterol, 20-35 gm of total fiber daily    Tobacco Cessation Yes    Number of packs per day Half    Intervention Assist the participant in steps to quit. Provide individualized education and counseling about committing to Tobacco Cessation, relapse prevention, and pharmacological support that can be provided by physician.;Advice worker, assist with locating and accessing local/national Quit Smoking programs, and support quit date choice.    Expected Outcomes Short Term: Will demonstrate readiness to quit, by selecting a quit date.;Short Term: Will quit all tobacco product use, adhering to prevention of relapse plan.;Long Term: Complete abstinence from all tobacco products for at least 12 months from quit date.    Diabetes Yes    Intervention Provide education about signs/symptoms and action to take for hypo/hyperglycemia.;Provide education about proper nutrition, including hydration, and aerobic/resistive exercise prescription along with prescribed medications to achieve blood glucose in normal ranges: Fasting glucose 65-99 mg/dL    Expected Outcomes Short Term: Participant verbalizes understanding of the signs/symptoms and immediate care of hyper/hypoglycemia, proper foot care and importance of medication, aerobic/resistive exercise and nutrition plan for blood glucose control.;Long Term: Attainment of HbA1C < 7%.    Hypertension Yes    Intervention Provide education on lifestyle modifcations  including regular physical activity/exercise, weight management, moderate sodium restriction and increased consumption of fresh fruit, vegetables, and low fat dairy, alcohol moderation, and smoking cessation.;Monitor prescription use compliance.    Expected Outcomes Short Term: Continued assessment and intervention until BP is < 140/18mm HG in hypertensive participants. < 130/72mm HG in hypertensive participants with diabetes, heart failure or chronic kidney disease.;Long Term: Maintenance of blood pressure at goal levels.    Lipids Yes    Intervention Provide education and support for participant on nutrition & aerobic/resistive exercise along with prescribed medications to achieve LDL 70mg , HDL >40mg .    Expected Outcomes Short Term: Participant states understanding of desired cholesterol values and is compliant with medications prescribed. Participant is following exercise prescription and nutrition guidelines.;Long Term: Cholesterol controlled with medications as prescribed, with individualized exercise RX and with personalized nutrition plan. Value goals: LDL < 70mg , HDL > 40 mg.           Education:Diabetes - Individual verbal and written instruction to review signs/symptoms of diabetes, desired ranges of glucose level fasting, after meals and with exercise. Acknowledge that pre and post exercise glucose checks will be done for 3 sessions at entry of program. [place] from 12/16/2019 in [facility] Cardiac and Pulmonary Rehab  Date 11/15/19  Educator AS  Instruction Review Code 1- Verbalizes Understanding      Core Components/Risk Factors/Patient Goals Review:   Goals and Risk Factor Review    Row Name 12/16/19 [phone]             Core Components/Risk Factors/Patient Goals Review   Personal Goals Review Weight Management/Obesity;Hypertension;Diabetes       Review [name] reports weight relatively stable since beginning program with maybe a couple of pounds lost. RD just  spoke with pt regarding nutrition  less than a week ago, still working on changes. Jakobe reports his BP has been good at home and is similar to what we get in rehab (today 112/60). He is taking his medication as prescribed. His BG has been running high 200's - believe this is medication related as on Trulicity his BG was better, now he is on glipizide and metformin and his BG has been running high. RD discussed nutritional changes to help with BG. [name] has an appointment with his doctor on Wednesday.       Expected Outcomes ST: see doctor, continue to take medication as prescibed and BP, continue to work on exercise and nutrition changes. LT: BG less than 200 in AM              Core Components/Risk Factors/Patient Goals at Discharge (Final Review):   Goals and Risk Factor Review - 12/16/19 0942      Core Components/Risk Factors/Patient Goals Review   Personal Goals Review Weight Management/Obesity;Hypertension;Diabetes    Review [name] reports weight relatively stable since beginning program with maybe a couple of pounds lost. RD just spoke with pt regarding nutrition less than a week ago, still working on changes. [name] reports his BP has been good at home and is similar to what we get in rehab (today 112/60). He is taking his medication as prescribed. His BG has been running high 200's - believe this is medication related as on Trulicity his BG was better, now he is on glipizide and metformin and his BG has been running high. RD discussed nutritional changes to help with BG. [name] has an appointment with his doctor on Wednesday.    Expected Outcomes ST: see doctor, continue to take medication as prescibed and BP, continue to work on exercise and nutrition changes. LT: BG less than 200 in AM           ITP Comments:  ITP Comments    Row Name 11/11/19 0258 11/15/19 1629 11/24/19 0559 11/30/19 1015 12/13/19 1241   ITP Comments Virtual Visit completed. Patient informed on EP and RD appointment  and 6 Minute walk test. Patient also informed of patient health questionnaires on My Chart. Patient Verbalizes understanding. Visit diagnosis can be found in [facility] 10/18/2019. Completed 6MWT and gym orientation. Initial ITP created and sent for review to Dr. Emily Filbert, Medical Director. 30 Day review completed. Medical Director ITP review done, changes made as directed, and signed approval by Medical Director. First full day of exercise!  Patient was oriented to gym and equipment including functions, settings, policies, and procedures.  Patient's individual exercise prescription and treatment plan were reviewed.  All starting workloads were established based on the results of the 6 minute walk test done at initial orientation visit.  The plan for exercise progression was also introduced and progression will be customized based on patient's performance and goals. Completed initial RD evaluation.   Row Name 12/22/19 5277 12/22/19 0659 01/03/20 1054 01/11/20 0843 01/19/20 1022   ITP Comments [name] has had blood glucose levels above 300 several days. Has been sent home to review this with his physician. 30 Day review completed. Medical Director ITP review done, changes made as directed, and signed approval by Medical Director. Called to check on pt. Out since 12/16/19.  His sugars have been running >300 mg/dl.  He saw endocrinologist last week and changed his medications. He is also having some problems with his kidneys.  He hopes to return next week. [name] has been out since 12/16/19.  His blood sugars are still running high even after his MD appointment. He is working together with his doctor to fix this and try new medication. We will call to check in in two weeks- 01/24/20. Unable to complete goals/re-evaluations at this time. 30 Day review completed. Medical Director ITP review done, changes made as directed, and signed approval by Medical Director. no visits in November   Row Name 01/31/20 1202 02/09/20 1328  02/16/20 0607       ITP Comments Called [name] to check in regarding his blood sugar and returning to rehab. He reports his blood sugar is now around 130 and is doing well, however, he has just lost his insurance and is trying to get coverage. He reports he should hear back in a couple of days. We will hold his spot until the beginning of the year- at that time if he cannot return we will have to discharge him at that time. Called [name] to check in regarding his insurance, he has still not heard anything and is going to try again today. We will check in again next week. His new insurance may not cover rehab as it may be considered pre-existing - At that time we could look at getting him in under Pulmonary Rehab if he qualifies. If we don't know anything before the new year we would have to discharge him at that time. 30 Day review completed. Medical Director ITP review done, changes made as directed, and signed approval by Medical Director.    HAs not attended this month            Comments:

## 2020-02-16 NOTE — Telephone Encounter (Signed)
Tried calling Anes twice today to check in regarding if he was able to get new insurance, both times message said "Call could not be completed at this time, please try again later".

## 2020-02-17 ENCOUNTER — Telehealth: Payer: Self-pay

## 2020-02-17 NOTE — Telephone Encounter (Signed)
His home phone number is still not letting the call go through. Tried his mobile number and left a message. If we do not hear back from him next week, we will need to discharge at this time.

## 2020-02-23 DIAGNOSIS — Z955 Presence of coronary angioplasty implant and graft: Secondary | ICD-10-CM

## 2020-02-23 NOTE — Progress Notes (Signed)
Discharge Progress Report  Patient Details  Name: [name] MRN: [id number] Date of Birth: [birth year] Referring Provider:   Flowsheet Row Cardiac Rehab from 11/15/2019 in [facility] Cardiac and Pulmonary Rehab  Referring Provider [place]       Number of Visits: 8    Reason for Discharge:  Early Exit:  Personal  Smoking History:  Social History   Tobacco Use  Smoking Status Current Every Day Smoker  . Packs/day: 0.50  . Years: 50.00  . Pack years: 25.00  . Types: Cigarettes  Smokeless Tobacco Never Used  Tobacco Comment   Patient states he is ready to quit    Diagnosis:  Status post coronary artery stent placement  ADL UCSD:   Initial Exercise Prescription:  Initial Exercise Prescription - 11/15/19 1600      Date of Initial Exercise RX and Referring Provider   Date 11/15/19    Referring Provider Arida      Treadmill   MPH 1    Grade 0    Minutes 15    METs 2      Recumbant Bike   Level 1    RPM 60    Minutes 15    METs 2      NuStep   Level 1    SPM 80    Minutes 15    METs 2      REL-XR   Level 1    Speed 50    Minutes 15    METs 2      T5 Nustep   Level 1    SPM 80    Minutes 15    METs 2      Prescription Details   Frequency (times per week) 3    Duration Progress to 30 minutes of continuous aerobic without signs/symptoms of physical distress      Intensity   THRR 40-80% of Max Heartrate 104-140    Ratings of Perceived Exertion 11-13    Perceived Dyspnea 0-4      Progression   Progression Continue to progress workloads to maintain intensity without signs/symptoms of physical distress.      Resistance Training   Training Prescription Yes    Weight 3 lb    Reps 10-15           Discharge Exercise Prescription (Final Exercise Prescription Changes):  Exercise Prescription Changes - 12/20/19 1000      Response to Exercise   Blood Pressure (Admit) 112/60    Blood Pressure (Exercise) 146/64    Blood Pressure (Exit) 92/56     Heart Rate (Admit) 73 bpm    Heart Rate (Exercise) 91 bpm    Heart Rate (Exit) 75 bpm    Rating of Perceived Exertion (Exercise) 13    Symptoms none    Duration Continue with 30 min of aerobic exercise without signs/symptoms of physical distress.    Intensity THRR unchanged      Progression   Progression Continue to progress workloads to maintain intensity without signs/symptoms of physical distress.    Average METs 1.92      Resistance Training   Training Prescription Yes    Weight 3 lb    Reps 10-15      Interval Training   Interval Training No      Treadmill   MPH 1.2    Grade 0    Minutes 15    METs 1.84      T5 Nustep   Level 1  SPM 80    Minutes 15    METs 2           Functional Capacity:  6 Minute Walk    Row Name 11/15/19 1610         6 Minute Walk   Phase Initial     Distance 975 feet     Walk Time 6 minutes     # of Rest Breaks 0     MPH 1.85     METS 2.1     RPE 11     Perceived Dyspnea  1     VO2 Peak 7.4     Symptoms Yes (comment)     Comments hip pain 3/10     Resting HR 67 bpm     Resting BP 100/68     Resting Oxygen Saturation  97 %     Exercise Oxygen Saturation  during 6 min walk 98 %     Max Ex. HR 76 bpm     Max Ex. BP 112/56     2 Minute Post BP 92/56            Psychological, QOL, Others - Outcomes: PHQ 2/9: Depression screen PHQ 2/9 11/15/2019  Decreased Interest 1  Down, Depressed, Hopeless 0  PHQ - 2 Score 1  Altered sleeping 0  Tired, decreased energy 2  Change in appetite 0  Feeling bad or failure about yourself  0  Trouble concentrating 0  Moving slowly or fidgety/restless 0  Suicidal thoughts 0  PHQ-9 Score 3    Quality of Life:  Quality of Life - 11/15/19 1622      Quality of Life   Select Quality of Life      Quality of Life Scores   Health/Function Pre 21.11 %    Socioeconomic Pre 28.8 %    Psych/Spiritual Pre 30 %    Family Pre 26.4 %    GLOBAL Pre 25.21 %           Nutrition & Weight  - Outcomes:  Pre Biometrics - 11/15/19 1620      Pre Biometrics   Height 5' 6.5" (1.689 m)    Weight 211 lb 12.8 oz (96.1 kg)    BMI (Calculated) 33.68    Single Leg Stand 28.5 seconds            Nutrition:  Nutrition Therapy & Goals - 12/13/19 1202      Nutrition Therapy   Diet heart healthy, low Na, T2DM    Drug/Food Interactions Statins/Certain Fruits    Protein (specify units) 75g    Fiber 30 grams    Whole Grain Foods 3 servings    Saturated Fats 12 max. grams    Fruits and Vegetables 8 servings/day    Sodium 1.5 grams      Personal Nutrition Goals   Nutrition Goal ST: fruit and peanutbutter for a snack, popcorn with nuts LT: A1C <7; A1C in July was 12, 1/2 grains whole grains, follow MyPlate, 3 meals with 2 snacks, 8 fruits and vegetables, <2g Na    Comments He reports lately not eating a lot, he is afraid of having high BG. Sat. BG 190 - cajun filet biscuit and chicken stew for dinner. Just started seeing new endocronologist - will see in November. PCP put him on glipizide, new MD increased. his very high BG could be due to medication management - encouraged to let doctor know what is going on, he could be at  risk for a hyperglycemic emergency. B; scrambled egg or sandwich L: sandwich for lunch, chicken sandwich take out. D: chicken, fish, corn, green beans. S: potato chips Drinks: tea - diluted (sweet tea - 1 cup of sugar per gallon) with dinner, water, sometimes diet soda. Discussed  heart healthy and diabetes friendly eating.      Intervention Plan   Intervention Prescribe, educate and counsel regarding individualized specific dietary modifications aiming towards targeted core components such as weight, hypertension, lipid management, diabetes, heart failure and other comorbidities.;Nutrition handout(s) given to patient.    Expected Outcomes Short Term Goal: Understand basic principles of dietary content, such as calories, fat, sodium, cholesterol and nutrients.;Short Term  Goal: A plan has been developed with personal nutrition goals set during dietitian appointment.;Long Term Goal: Adherence to prescribed nutrition plan.           Nutrition Discharge:  Nutrition Assessments - 11/15/19 1623      MEDFICTS Scores   Pre Score 36           Education Questionnaire Score:  Knowledge Questionnaire Score - 11/15/19 1621      Knowledge Questionnaire Score   Pre Score 21/26 HF,exercise,nutrition           Goals reviewed with patient; copy given to patient.

## 2020-02-23 NOTE — Progress Notes (Signed)
Cardiac Individual Treatment Plan  Patient Details  Name: [name] MRN: [id number] Date of Birth: Aug 20, 1960 Referring Provider:   Flowsheet Row Cardiac Rehab from 11/15/2019 in [facility] Cardiac and Pulmonary Rehab  Referring Provider Arida      Initial Encounter Date:  Flowsheet Row Cardiac Rehab from 11/15/2019 in [facility] Cardiac and Pulmonary Rehab  Date 11/15/19      Visit Diagnosis: Status post coronary artery stent placement  Patient's Home Medications on Admission:  Current Outpatient Medications:  .  aspirin EC 81 MG tablet, Take 81 mg by mouth daily., Disp: , Rfl:  .  atorvastatin (LIPITOR) 80 MG tablet, Take 80 mg by mouth daily., Disp: , Rfl:  .  carvedilol (COREG) 3.125 MG tablet, Take 1 tablet (3.125 mg total) by mouth 2 (two) times daily with a meal., Disp: 180 tablet, Rfl: 3 .  Cinnamon 500 MG capsule, Take 1,000 mg by mouth daily., Disp: , Rfl:  .  clopidogrel (PLAVIX) 75 MG tablet, Take 75 mg by mouth daily., Disp: , Rfl:  .  EPINEPHrine 0.3 mg/0.3 mL IJ SOAJ injection, Inject 0.3 mg into the muscle as needed for anaphylaxis., Disp: , Rfl:  .  famotidine (PEPCID) 20 MG tablet, Take 20 mg by mouth daily., Disp: , Rfl:  .  glipiZIDE (GLUCOTROL) 10 MG tablet, Take 2 tablets (20 mg total) by mouth 2 (two) times daily before a meal., Disp: 360 tablet, Rfl: 2 .  glucose blood test strip, Use OneTouch Verio test strips as instructed to check blood sugar twice daily. DX:E11.65, Disp: 100 each, Rfl: 3 .  hydrochlorothiazide (HYDRODIURIL) 25 MG tablet, Take 1 tablet (25 mg total) by mouth daily., Disp: , Rfl:  .  isosorbide mononitrate (IMDUR) 60 MG 24 hr tablet, Take 1.5 tablets (90 mg total) by mouth daily., Disp: 45 tablet, Rfl: 5 .  metFORMIN (GLUCOPHAGE) 1000 MG tablet, Take 1 tablet (1,000 mg total) by mouth 2 (two) times daily with a meal. Restart on 10/02/19., Disp: 180 tablet, Rfl: 3 .  nitroGLYCERIN (NITROSTAT) 0.4 MG SL tablet, Place 0.4 mg under the tongue every 5  (five) minutes as needed for chest pain. , Disp: , Rfl:  .  pioglitazone (ACTOS) 45 MG tablet, Take 1 tablet (45 mg total) by mouth daily., Disp: 90 tablet, Rfl: 1 .  ranolazine (RANEXA) 500 MG 12 hr tablet, Take 500 mg by mouth 2 (two) times daily., Disp: , Rfl:  .  vitamin B-12 (CYANOCOBALAMIN) 1000 MCG tablet, Take 1,000 mcg by mouth daily., Disp: , Rfl:   Past Medical History: Past Medical History:  Diagnosis Date  . CKD (chronic kidney disease), stage III ([place])   . Coronary artery disease    a.  H/o NSTEMI s/p LAD/RCA stenting; b. 08/2019 PCI: RCA 99p (2.5x15 Resolute Onyx DES), 75p/m, mild ISR; c. 09/2019 PCI: 85p - heavily calcified (intravascular lithotripsy & 3.0x26 Resolute Onyx DES); d. 10/18/19 Relook cath: LM 25, LAD 45m ISR, 75d, 70/50d/apical, RI 80, LCX 50p/m/d, RCA 30p ISR, 48m ISR, 50d, RPDA 60-->Med Rx.  . Diabetes mellitus without complication ([place])   . Diastolic dysfunction    a. 08/2019 Echo: EF 50-55%, no rwma, Gr1 DD, nl RV size/fxn. Triv MR.  Marland Kitchen GERD (gastroesophageal reflux disease)   . Hyperlipidemia   . Hypertension   . NSTEMI (non-ST elevated myocardial infarction) ([place]) 08/2019  . Sleep apnea   . Tobacco abuse     Tobacco Use: Social History   Tobacco Use  Smoking Status Current Every  Day Smoker  . Packs/day: 0.50  . Years: 50.00  . Pack years: 25.00  . Types: Cigarettes  Smokeless Tobacco Never Used  Tobacco Comment   Patient states he is ready to quit    Labs: Recent Review Flowsheet Data    Labs for ITP Cardiac and Pulmonary Rehab Latest Ref Rng & Units 08/31/2019 09/01/2019 12/31/2019   Cholestrol 0 - 200 mg/dL - 227(H) 170   LDLCALC 0 - 99 mg/dL - UNABLE TO CALCULATE IF TRIGLYCERIDE OVER 400 mg/dL -   LDLDIRECT mg/dL - 71.5 74.0   HDL >39.00 mg/dL - 34(L) 35.70(L)   Trlycerides 0.0 - 149.0 mg/dL - 755(H) 434.0 Triglyceride is over 400; calculations on Lipids are invalid.(H)   Hemoglobin A1c 4.0 - 5.6 % 12.1(H) - 9.1(A)       Exercise Target  Goals: Exercise Program Goal: Individual exercise prescription set using results from initial 6 min walk test and THRR while considering  patient's activity barriers and safety.   Exercise Prescription Goal: Initial exercise prescription builds to 30-45 minutes a day of aerobic activity, 2-3 days per week.  Home exercise guidelines will be given to patient during program as part of exercise prescription that the participant will acknowledge.   Education: Aerobic Exercise: - Group verbal and visual presentation on the components of exercise prescription. Introduces F.I.T.T principle from ACSM for exercise prescriptions.  Reviews F.I.T.T. principles of aerobic exercise including progression. Written material given at graduation. Flowsheet Row Cardiac Rehab from 12/16/2019 in [facility] Cardiac and Pulmonary Rehab  Date 12/16/19  Hilda Blades only on 10/28]  Educator [facility]  Instruction Review Code 1- [employer] Understanding      Education: Resistance Exercise: - Group verbal and visual presentation on the components of exercise prescription. Introduces F.I.T.T principle from ACSM for exercise prescriptions  Reviews F.I.T.T. principles of resistance exercise including progression. Written material given at graduation.    Education: Exercise & Equipment Safety: - Individual verbal instruction and demonstration of equipment use and safety with use of the equipment. Flowsheet Row Cardiac Rehab from 12/16/2019 in [facility] Cardiac and Pulmonary Rehab  Date 11/15/19  Educator AS  Instruction Review Code 1- Verbalizes Understanding      Education: Exercise Physiology & General Exercise Guidelines: - Group verbal and written instruction with models to review the exercise physiology of the cardiovascular system and associated critical values. Provides general exercise guidelines with specific guidelines to those with heart or lung disease.  Flowsheet Row Cardiac Rehab from 12/16/2019 in [facility] Cardiac and  Pulmonary Rehab  Date 12/02/19  Educator [facility]  Instruction Review Code 1- Verbalizes Understanding      Education: Flexibility, Balance, Mind/Body Relaxation: - Group verbal and visual presentation with interactive activity on the components of exercise prescription. Introduces F.I.T.T principle from ACSM for exercise prescriptions. Reviews F.I.T.T. principles of flexibility and balance exercise training including progression. Also discusses the mind body connection.  Reviews various relaxation techniques to help reduce and manage stress (i.e. Deep breathing, progressive muscle relaxation, and visualization). Balance handout provided to take home. Written material given at graduation.   Activity Barriers & Risk Stratification:   6 Minute Walk:  6 Minute Walk    Row Name 11/15/19 1610         6 Minute Walk   Phase Initial     Distance 975 feet     Walk Time 6 minutes     # of Rest Breaks 0     MPH 1.85     METS 2.1  RPE 11     Perceived Dyspnea  1     VO2 Peak 7.4     Symptoms Yes (comment)     Comments hip pain 3/10     Resting HR 67 bpm     Resting BP 100/68     Resting Oxygen Saturation  97 %     Exercise Oxygen Saturation  during 6 min walk 98 %     Max Ex. HR 76 bpm     Max Ex. BP 112/56     2 Minute Post BP 92/56            Oxygen Initial Assessment:   Oxygen Re-Evaluation:   Oxygen Discharge (Final Oxygen Re-Evaluation):   Initial Exercise Prescription:  Initial Exercise Prescription - 11/15/19 1600      Date of Initial Exercise RX and Referring Provider   Date 11/15/19    Referring Provider Arida      Treadmill   MPH 1    Grade 0    Minutes 15    METs 2      Recumbant Bike   Level 1    RPM 60    Minutes 15    METs 2      NuStep   Level 1    SPM 80    Minutes 15    METs 2      REL-XR   Level 1    Speed 50    Minutes 15    METs 2      T5 Nustep   Level 1    SPM 80    Minutes 15    METs 2      Prescription Details    Frequency (times per week) 3    Duration Progress to 30 minutes of continuous aerobic without signs/symptoms of physical distress      Intensity   THRR 40-80% of Max Heartrate 104-140    Ratings of Perceived Exertion 11-13    Perceived Dyspnea 0-4      Progression   Progression Continue to progress workloads to maintain intensity without signs/symptoms of physical distress.      Resistance Training   Training Prescription Yes    Weight 3 lb    Reps 10-15           Perform Capillary Blood Glucose checks as needed.  Exercise Prescription Changes:  Exercise Prescription Changes    Row Name 12/06/19 0800 12/20/19 1000           Response to Exercise   Blood Pressure (Admit) 108/62 112/60      Blood Pressure (Exercise) 112/68 146/64      Blood Pressure (Exit) 106/62 92/56      Heart Rate (Admit) 71 bpm 73 bpm      Heart Rate (Exercise) 82 bpm 91 bpm      Heart Rate (Exit) 73 bpm 75 bpm      Rating of Perceived Exertion (Exercise) 13 13      Symptoms none none      Comments second full day of exercise --      Duration Progress to 30 minutes of  aerobic without signs/symptoms of physical distress Continue with 30 min of aerobic exercise without signs/symptoms of physical distress.      Intensity THRR unchanged THRR unchanged             Progression   Progression Continue to progress workloads to maintain intensity without signs/symptoms of physical distress. Continue to progress  workloads to maintain intensity without signs/symptoms of physical distress.      Average METs 2.02 1.92             Resistance Training   Training Prescription Yes Yes      Weight 3 lb 3 lb      Reps 10-15 10-15             Interval Training   Interval Training No No             Treadmill   MPH 1.1 1.2      Grade 0 0      Minutes 15 15      METs 1.84 1.84             T5 Nustep   Level 1 1      SPM -- 80      Minutes 15 15      METs 2.2 2             Exercise Comments:   Exercise Comments    Row Name 11/30/19 1016           Exercise Comments First full day of exercise!  Patient was oriented to gym and equipment including functions, settings, policies, and procedures.  Patient's individual exercise prescription and treatment plan were reviewed.  All starting workloads were established based on the results of the 6 minute walk test done at initial orientation visit.  The plan for exercise progression was also introduced and progression will be customized based on patient's performance and goals.              Exercise Goals and Review:  Exercise Goals    Row Name 11/15/19 1619             Exercise Goals   Increase Physical Activity Yes       Intervention Provide advice, education, support and counseling about physical activity/exercise needs.;Develop an individualized exercise prescription for aerobic and resistive training based on initial evaluation findings, risk stratification, comorbidities and participant's personal goals.       Expected Outcomes Short Term: Attend rehab on a regular basis to increase amount of physical activity.;Long Term: Add in home exercise to make exercise part of routine and to increase amount of physical activity.;Long Term: Exercising regularly at least 3-5 days a week.       Increase Strength and Stamina Yes       Intervention Provide advice, education, support and counseling about physical activity/exercise needs.;Develop an individualized exercise prescription for aerobic and resistive training based on initial evaluation findings, risk stratification, comorbidities and participant's personal goals.       Expected Outcomes Short Term: Increase workloads from initial exercise prescription for resistance, speed, and METs.;Short Term: Perform resistance training exercises routinely during rehab and add in resistance training at home;Long Term: Improve cardiorespiratory fitness, muscular endurance and strength as measured by  increased METs and functional capacity (6MWT)       Able to understand and use rate of perceived exertion (RPE) scale Yes       Intervention Provide education and explanation on how to use RPE scale       Expected Outcomes Short Term: Able to use RPE daily in rehab to express subjective intensity level;Long Term:  Able to use RPE to guide intensity level when exercising independently       Able to understand and use Dyspnea scale Yes       Intervention Provide education and explanation  on how to use Dyspnea scale       Expected Outcomes Short Term: Able to use Dyspnea scale daily in rehab to express subjective sense of shortness of breath during exertion;Long Term: Able to use Dyspnea scale to guide intensity level when exercising independently       Knowledge and understanding of Target Heart Rate Range (THRR) Yes       Intervention Provide education and explanation of THRR including how the numbers were predicted and where they are located for reference       Expected Outcomes Short Term: Able to state/look up THRR;Short Term: Able to use daily as guideline for intensity in rehab;Long Term: Able to use THRR to govern intensity when exercising independently       Able to check pulse independently Yes       Intervention Provide education and demonstration on how to check pulse in carotid and radial arteries.;Review the importance of being able to check your own pulse for safety during independent exercise       Expected Outcomes Short Term: Able to explain why pulse checking is important during independent exercise;Long Term: Able to check pulse independently and accurately       Understanding of Exercise Prescription Yes       Intervention Provide education, explanation, and written materials on patient's individual exercise prescription       Expected Outcomes Short Term: Able to explain program exercise prescription;Long Term: Able to explain home exercise prescription to exercise independently               Exercise Goals Re-Evaluation :  Exercise Goals Re-Evaluation    Row Name 11/30/19 1016 12/06/19 0838 12/16/19 0938 12/20/19 1044 01/03/20 1055     Exercise Goal Re-Evaluation   Exercise Goals Review Able to understand and use rate of perceived exertion (RPE) scale;Knowledge and understanding of Target Heart Rate Range (THRR);Understanding of Exercise Prescription Increase Physical Activity;Increase Strength and Stamina;Understanding of Exercise Prescription Increase Physical Activity;Increase Strength and Stamina;Understanding of Exercise Prescription Increase Physical Activity;Increase Strength and Stamina;Understanding of Exercise Prescription --   Comments Reviewed RPE and dyspnea scales, THR and program prescription with pt today.  Pt voiced understanding and was given a copy of goals to take home. [name] is off to a good start in rehab.  He is already up to 2.2 METs on the T5 NuStep.  We will continue to monitor his progress. [name] walks everyday for 15 minutes at RPE 12. He has no weights at home at this time. EP has not gone over home execise with [name] yet. [name] has tolerated exercise well in his first sessions.  Staff will monitor progress. Out since last review   Expected Outcomes Short: Use RPE daily to regulate intensity. Long: Follow program prescription in THR. Short: Continue to attend rehab regularly Long: Continue to follow program prescription ST: increase walk to 20 minutes LT: exercise 150 minutes of moderate activity Short: review home exercise with staff Long: exercise consistently 3-5 days per week --   [place] Name 02/01/20 0811             Exercise Goal Re-Evaluation   Comments Out since last review              Discharge Exercise Prescription (Final Exercise Prescription Changes):  Exercise Prescription Changes - 12/20/19 1000      Response to Exercise   Blood Pressure (Admit) 112/60    Blood Pressure (Exercise) 146/64    Blood Pressure (Exit) 92/56  Heart Rate (Admit) 73 bpm    Heart Rate (Exercise) 91 bpm    Heart Rate (Exit) 75 bpm    Rating of Perceived Exertion (Exercise) 13    Symptoms none    Duration Continue with 30 min of aerobic exercise without signs/symptoms of physical distress.    Intensity THRR unchanged      Progression   Progression Continue to progress workloads to maintain intensity without signs/symptoms of physical distress.    Average METs 1.92      Resistance Training   Training Prescription Yes    Weight 3 lb    Reps 10-15      Interval Training   Interval Training No      Treadmill   MPH 1.2    Grade 0    Minutes 15    METs 1.84      T5 Nustep   Level 1    SPM 80    Minutes 15    METs 2           Nutrition:  Target Goals: Understanding of nutrition guidelines, daily intake of sodium 1500mg , cholesterol 200mg , calories 30% from fat and 7% or less from saturated fats, daily to have 5 or more servings of fruits and vegetables.  Education: All About Nutrition: -Group instruction provided by verbal, written material, interactive activities, discussions, models, and posters to present general guidelines for heart healthy nutrition including fat, fiber, MyPlate, the role of sodium in heart healthy nutrition, utilization of the nutrition label, and utilization of this knowledge for meal planning. Follow up email sent as well. Written material given at graduation.   Biometrics:  Pre Biometrics - 11/15/19 1620      Pre Biometrics   Height 5' 6.5" (1.689 m)    Weight 211 lb 12.8 oz (96.1 kg)    BMI (Calculated) 33.68    Single Leg Stand 28.5 seconds            Nutrition Therapy Plan and Nutrition Goals:  Nutrition Therapy & Goals - 12/13/19 1202      Nutrition Therapy   Diet heart healthy, low Na, T2DM    Drug/Food Interactions Statins/Certain Fruits    Protein (specify units) 75g    Fiber 30 grams    Whole Grain Foods 3 servings    Saturated Fats 12 max. grams    Fruits and  Vegetables 8 servings/day    Sodium 1.5 grams      Personal Nutrition Goals   Nutrition Goal ST: fruit and peanutbutter for a snack, popcorn with nuts LT: A1C <7; A1C in July was 12, 1/2 grains whole grains, follow MyPlate, 3 meals with 2 snacks, 8 fruits and vegetables, <2g Na    Comments He reports lately not eating a lot, he is afraid of having high BG. Sat. BG 190 - cajun filet biscuit and chicken stew for dinner. Just started seeing new endocronologist - will see in November. PCP put him on glipizide, new MD increased. his very high BG could be due to medication management - encouraged to let doctor know what is going on, he could be at risk for a hyperglycemic emergency. B; scrambled egg or sandwich L: sandwich for lunch, chicken sandwich take out. D: chicken, fish, corn, green beans. S: potato chips Drinks: tea - diluted (sweet tea - 1 cup of sugar per gallon) with dinner, water, sometimes diet soda. Discussed  heart healthy and diabetes friendly eating.      Intervention Plan   Intervention  Prescribe, educate and counsel regarding individualized specific dietary modifications aiming towards targeted core components such as weight, hypertension, lipid management, diabetes, heart failure and other comorbidities.;Nutrition handout(s) given to patient.    Expected Outcomes Short Term Goal: Understand basic principles of dietary content, such as calories, fat, sodium, cholesterol and nutrients.;Short Term Goal: A plan has been developed with personal nutrition goals set during dietitian appointment.;Long Term Goal: Adherence to prescribed nutrition plan.           Nutrition Assessments:  Nutrition Assessments - 11/15/19 1623      MEDFICTS Scores   Pre Score 36          MEDIFICTS Score Key:  ?70 Need to make dietary changes   40-70 Heart Healthy Diet  ? 40 Therapeutic Level Cholesterol Diet   Picture Your Plate Scores:  <87 Unhealthy dietary pattern with much room for  improvement.  41-50 Dietary pattern unlikely to meet recommendations for good health and room for improvement.  51-60 More healthful dietary pattern, with some room for improvement.   >60 Healthy dietary pattern, although there may be some specific behaviors that could be improved.    Nutrition Goals Re-Evaluation:   Nutrition Goals Discharge (Final Nutrition Goals Re-Evaluation):   Psychosocial: Target Goals: Acknowledge presence or absence of significant depression and/or stress, maximize coping skills, provide positive support system. Participant is able to verbalize types and ability to use techniques and skills needed for reducing stress and depression.   Education: Stress, Anxiety, and Depression - Group verbal and visual presentation to define topics covered.  Reviews how body is impacted by stress, anxiety, and depression.  Also discusses healthy ways to reduce stress and to treat/manage anxiety and depression.  Written material given at graduation.   Education: Sleep Hygiene -Provides group verbal and written instruction about how sleep can affect your health.  Define sleep hygiene, discuss sleep cycles and impact of sleep habits. Review good sleep hygiene tips.    Initial Review & Psychosocial Screening:  Initial Psych Review & Screening - 11/11/19 0941      Initial Review   Current issues with None Identified      Family Dynamics   Good Support System? Yes    Comments He can look to his son, daughter and fiance for support. He feels positive about his health and wants to spend more time with his grandchildren.      Barriers   Psychosocial barriers to participate in program There are no identifiable barriers or psychosocial needs.;The patient should benefit from training in stress management and relaxation.      Screening Interventions   Interventions Encouraged to exercise           Quality of Life Scores:   Quality of Life - 11/15/19 1622      Quality of  Life   Select Quality of Life      Quality of Life Scores   Health/Function Pre 21.11 %    Socioeconomic Pre 28.8 %    Psych/Spiritual Pre 30 %    Family Pre 26.4 %    GLOBAL Pre 25.21 %          Scores of 19 and below usually indicate a poorer quality of life in these areas.  A difference of  2-3 points is a clinically meaningful difference.  A difference of 2-3 points in the total score of the Quality of Life Index has been associated with significant improvement in overall quality of life, self-image, physical symptoms, and  general health in studies assessing change in quality of life.  PHQ-9: Recent Review Flowsheet Data    Depression screen [facility] 2/9 11/15/2019   Decreased Interest 1   Down, Depressed, Hopeless 0   PHQ - 2 Score 1   Altered sleeping 0   Tired, decreased energy 2   Change in appetite 0   Feeling bad or failure about yourself  0   Trouble concentrating 0   Moving slowly or fidgety/restless 0   Suicidal thoughts 0   PHQ-9 Score 3     Interpretation of Total Score  Total Score Depression Severity:  1-4 = Minimal depression, 5-9 = Mild depression, 10-14 = Moderate depression, 15-19 = Moderately severe depression, 20-27 = Severe depression   Psychosocial Evaluation and Intervention:  Psychosocial Evaluation - 11/11/19 0945      Psychosocial Evaluation & Interventions   Interventions Encouraged to exercise with the program and follow exercise prescription    Comments He can look to his son, daughter and fiance for support. He feels positive about his health and wants to spend more time with his grandchildren.    Expected Outcomes Short: Exercise regularly to support mental health and notify staff of any changes. Long: maintain mental health and well being through teaching of rehab or prescribed medications independently.    Continue Psychosocial Services  Follow up required by staff           Psychosocial Re-Evaluation:  Psychosocial Re-Evaluation    [place]  Name 12/16/19 0941             Psychosocial Re-Evaluation   Comments He reports no stress concerns at this time. He reports his family is a good support system for him. He likes to fish to relax.       Expected Outcomes ST: continue to reduce stress by fishing with grandchildren LT: maintain positive outlook.       Interventions Encouraged to attend Cardiac Rehabilitation for the exercise       Continue Psychosocial Services  Follow up required by staff              Psychosocial Discharge (Final Psychosocial Re-Evaluation):  Psychosocial Re-Evaluation - 12/16/19 0941      Psychosocial Re-Evaluation   Comments He reports no stress concerns at this time. He reports his family is a good support system for him. He likes to fish to relax.    Expected Outcomes ST: continue to reduce stress by fishing with grandchildren LT: maintain positive outlook.    Interventions Encouraged to attend Cardiac Rehabilitation for the exercise    Continue Psychosocial Services  Follow up required by staff           Vocational Rehabilitation: Provide vocational rehab assistance to qualifying candidates.   Vocational Rehab Evaluation & Intervention:   Education: Education Goals: Education classes will be provided on a variety of topics geared toward better understanding of heart health and risk factor modification. Participant will state understanding/return demonstration of topics presented as noted by education test scores.  Learning Barriers/Preferences:  Learning Barriers/Preferences - 11/11/19 0940      Learning Barriers/Preferences   Learning Barriers None    Learning Preferences None           General Cardiac Education Topics:  AED/CPR: - Group verbal and written instruction with the use of models to demonstrate the basic use of the AED with the basic ABC's of resuscitation.   Anatomy and Cardiac Procedures: - Group verbal and visual presentation and models  provide information  about basic cardiac anatomy and function. Reviews the testing methods done to diagnose heart disease and the outcomes of the test results. Describes the treatment choices: Medical Management, Angioplasty, or Coronary Bypass Surgery for treating various heart conditions including Myocardial Infarction, Angina, Valve Disease, and Cardiac Arrhythmias.  Written material given at graduation. Flowsheet Row Cardiac Rehab from 12/16/2019 in [facility] Cardiac and Pulmonary Rehab  Date 12/16/19  Educator SB  Instruction Review Code 1- Verbalizes Understanding      Medication Safety: - Group verbal and visual instruction to review commonly prescribed medications for heart and lung disease. Reviews the medication, class of the drug, and side effects. Includes the steps to properly store meds and maintain the prescription regimen.  Written material given at graduation.   Intimacy: - Group verbal instruction through game format to discuss how heart and lung disease can affect sexual intimacy. Written material given at graduation..   Know Your Numbers and Heart Failure: - Group verbal and visual instruction to discuss disease risk factors for cardiac and pulmonary disease and treatment options.  Reviews associated critical values for Overweight/Obesity, Hypertension, Cholesterol, and Diabetes.  Discusses basics of heart failure: signs/symptoms and treatments.  Introduces Heart Failure Zone chart for action plan for heart failure.  Written material given at graduation.   Infection Prevention: - Provides verbal and written material to individual with discussion of infection control including proper hand washing and proper equipment cleaning during exercise session. Flowsheet Row Cardiac Rehab from 11/11/2019 in [facility] Cardiac and Pulmonary Rehab  Date 11/11/19  Educator [facility]  Instruction Review Code 1- Verbalizes Understanding      Falls Prevention: - Provides verbal and written material to individual with  discussion of falls prevention and safety. Flowsheet Row Cardiac Rehab from 12/16/2019 in [facility] Cardiac and Pulmonary Rehab  Date 11/15/19  Educator AS  Instruction Review Code 1- Verbalizes Understanding      Other: -Provides group and verbal instruction on various topics (see comments) Flowsheet Row Cardiac Rehab from 12/16/2019 in [facility] Cardiac and Pulmonary Rehab  Date 11/15/19  [facility] quit smoking packet]  Educator AS  Instruction Review Code 1- Verbalizes Understanding      Knowledge Questionnaire Score:  Knowledge Questionnaire Score - 11/15/19 1621      Knowledge Questionnaire Score   Pre Score 21/26 HF,exercise,nutrition           Core Components/Risk Factors/Patient Goals at Admission:  Personal Goals and Risk Factors at Admission - 11/15/19 1623      Core Components/Risk Factors/Patient Goals on Admission    Weight Management Yes;Weight Loss    Intervention Weight Management: Develop a combined nutrition and exercise program designed to reach desired caloric intake, while maintaining appropriate intake of nutrient and fiber, sodium and fats, and appropriate energy expenditure required for the weight goal.;Weight Management: Provide education and appropriate resources to help participant work on and attain dietary goals.;Weight Management/Obesity: Establish reasonable short term and long term weight goals.    Admit Weight 211 lb 12.8 oz (96.1 kg)    Goal Weight: Short Term 205 lb (93 kg)    Goal Weight: Long Term 200 lb (90.7 kg)    Expected Outcomes Short Term: Continue to assess and modify interventions until short term weight is achieved;Long Term: Adherence to nutrition and physical activity/exercise program aimed toward attainment of established weight goal;Weight Maintenance: Understanding of the daily nutrition guidelines, which includes 25-35% calories from fat, 7% or less cal from saturated fats, less than 200mg  cholesterol, less  than 1.5gm of sodium, & 5 or more  servings of fruits and vegetables daily;Weight Loss: Understanding of general recommendations for a balanced deficit meal plan, which promotes 1-2 lb weight loss per week and includes a negative energy balance of [phone] kcal/d;Understanding of distribution of calorie intake throughout the day with the consumption of 4-5 meals/snacks;Understanding recommendations for meals to include 15-35% energy as protein, 25-35% energy from fat, 35-60% energy from carbohydrates, less than 200mg  of dietary cholesterol, 20-35 gm of total fiber daily    Tobacco Cessation Yes    Number of packs per day Half    Intervention Assist the participant in steps to quit. Provide individualized education and counseling about committing to Tobacco Cessation, relapse prevention, and pharmacological support that can be provided by physician.;Advice worker, assist with locating and accessing local/national Quit Smoking programs, and support quit date choice.    Expected Outcomes Short Term: Will demonstrate readiness to quit, by selecting a quit date.;Short Term: Will quit all tobacco product use, adhering to prevention of relapse plan.;Long Term: Complete abstinence from all tobacco products for at least 12 months from quit date.    Diabetes Yes    Intervention Provide education about signs/symptoms and action to take for hypo/hyperglycemia.;Provide education about proper nutrition, including hydration, and aerobic/resistive exercise prescription along with prescribed medications to achieve blood glucose in normal ranges: Fasting glucose 65-99 mg/dL    Expected Outcomes Short Term: Participant verbalizes understanding of the signs/symptoms and immediate care of hyper/hypoglycemia, proper foot care and importance of medication, aerobic/resistive exercise and nutrition plan for blood glucose control.;Long Term: Attainment of HbA1C < 7%.    Hypertension Yes    Intervention Provide education on lifestyle modifcations  including regular physical activity/exercise, weight management, moderate sodium restriction and increased consumption of fresh fruit, vegetables, and low fat dairy, alcohol moderation, and smoking cessation.;Monitor prescription use compliance.    Expected Outcomes Short Term: Continued assessment and intervention until BP is < 140/40mm HG in hypertensive participants. < 130/4mm HG in hypertensive participants with diabetes, heart failure or chronic kidney disease.;Long Term: Maintenance of blood pressure at goal levels.    Lipids Yes    Intervention Provide education and support for participant on nutrition & aerobic/resistive exercise along with prescribed medications to achieve LDL 70mg , HDL >40mg .    Expected Outcomes Short Term: Participant states understanding of desired cholesterol values and is compliant with medications prescribed. Participant is following exercise prescription and nutrition guidelines.;Long Term: Cholesterol controlled with medications as prescribed, with individualized exercise RX and with personalized nutrition plan. Value goals: LDL < 70mg , HDL > 40 mg.           Education:Diabetes - Individual verbal and written instruction to review signs/symptoms of diabetes, desired ranges of glucose level fasting, after meals and with exercise. Acknowledge that pre and post exercise glucose checks will be done for 3 sessions at entry of program. [place] from 12/16/2019 in [facility] Cardiac and Pulmonary Rehab  Date 11/15/19  Educator AS  Instruction Review Code 1- Verbalizes Understanding      Core Components/Risk Factors/Patient Goals Review:   Goals and Risk Factor Review    Row Name 12/16/19 [id number]             Core Components/Risk Factors/Patient Goals Review   Personal Goals Review Weight Management/Obesity;Hypertension;Diabetes       Review [name] reports weight relatively stable since beginning program with maybe a couple of pounds lost. RD just  spoke with pt regarding nutrition  less than a week ago, still working on changes. [name] reports his BP has been good at home and is similar to what we get in rehab (today 112/60). He is taking his medication as prescribed. His BG has been running high 200's - believe this is medication related as on Trulicity his BG was better, now he is on glipizide and metformin and his BG has been running high. RD discussed nutritional changes to help with BG. [name] has an appointment with his doctor on Wednesday.       Expected Outcomes ST: see doctor, continue to take medication as prescibed and BP, continue to work on exercise and nutrition changes. LT: BG less than 200 in AM              Core Components/Risk Factors/Patient Goals at Discharge (Final Review):   Goals and Risk Factor Review - 12/16/19 0942      Core Components/Risk Factors/Patient Goals Review   Personal Goals Review Weight Management/Obesity;Hypertension;Diabetes    Review [name] reports weight relatively stable since beginning program with maybe a couple of pounds lost. RD just spoke with pt regarding nutrition less than a week ago, still working on changes. [name] reports his BP has been good at home and is similar to what we get in rehab (today 112/60). He is taking his medication as prescribed. His BG has been running high 200's - believe this is medication related as on Trulicity his BG was better, now he is on glipizide and metformin and his BG has been running high. RD discussed nutritional changes to help with BG. [name] has an appointment with his doctor on Wednesday.    Expected Outcomes ST: see doctor, continue to take medication as prescibed and BP, continue to work on exercise and nutrition changes. LT: BG less than 200 in AM           ITP Comments:  ITP Comments    Row Name 11/11/19 5681 11/15/19 1629 11/24/19 0559 11/30/19 1015 12/13/19 1241   ITP Comments Virtual Visit completed. Patient informed on EP and RD appointment  and 6 Minute walk test. Patient also informed of patient health questionnaires on My Chart. Patient Verbalizes understanding. Visit diagnosis can be found in [facility] 10/18/2019. Completed 6MWT and gym orientation. Initial ITP created and sent for review to Dr. Emily Filbert, Medical Director. 30 Day review completed. Medical Director ITP review done, changes made as directed, and signed approval by Medical Director. First full day of exercise!  Patient was oriented to gym and equipment including functions, settings, policies, and procedures.  Patient's individual exercise prescription and treatment plan were reviewed.  All starting workloads were established based on the results of the 6 minute walk test done at initial orientation visit.  The plan for exercise progression was also introduced and progression will be customized based on patient's performance and goals. Completed initial RD evaluation.   Row Name 12/22/19 2751 12/22/19 0659 01/03/20 1054 01/11/20 0843 01/19/20 1022   ITP Comments [name] has had blood glucose levels above 300 several days. Has been sent home to review this with his physician. 30 Day review completed. Medical Director ITP review done, changes made as directed, and signed approval by Medical Director. Called to check on pt. Out since 12/16/19.  His sugars have been running >300 mg/dl.  He saw endocrinologist last week and changed his medications. He is also having some problems with his kidneys.  He hopes to return next week. [name] has been out since 12/16/19.  His blood sugars are still running high even after his MD appointment. He is working together with his doctor to fix this and try new medication. We will call to check in in two weeks- 01/24/20. Unable to complete goals/re-evaluations at this time. 30 Day review completed. Medical Director ITP review done, changes made as directed, and signed approval by Medical Director. no visits in November   Row Name 01/31/20 1202 02/09/20 1328  02/16/20 0607 02/23/20 1407     ITP Comments Called [name] to check in regarding his blood sugar and returning to rehab. He reports his blood sugar is now around 130 and is doing well, however, he has just lost his insurance and is trying to get coverage. He reports he should hear back in a couple of days. We will hold his spot until the beginning of the year- at that time if he cannot return we will have to discharge him at that time. Called [name] to check in regarding his insurance, he has still not heard anything and is going to try again today. We will check in again next week. His new insurance may not cover rehab as it may be considered pre-existing - At that time we could look at getting him in under Pulmonary Rehab if he qualifies. If we don't know anything before the new year we would have to discharge him at that time. 30 Day review completed. Medical Director ITP review done, changes made as directed, and signed approval by Medical Director.    HAs not attended this month [name] recently lost his insurance and has not been able to become reinsured; he will have to discharge at this time.           Comments: ITP discharge note.

## 2020-03-03 NOTE — Telephone Encounter (Signed)
Patient is calling to get the status of his disability forms.

## 2020-03-03 NOTE — Telephone Encounter (Signed)
I have filled in what I could on the form. There are questions asked that will need to be addressed by Dr. Fletcher Anon.

## 2020-03-07 ENCOUNTER — Encounter: Payer: Self-pay | Admitting: Emergency Medicine

## 2020-03-07 ENCOUNTER — Emergency Department
Admission: EM | Admit: 2020-03-07 | Discharge: 2020-03-07 | Disposition: A | Discharge status: Home / Self Care | Payer: Medicaid Other | Attending: Emergency Medicine | Admitting: Emergency Medicine

## 2020-03-07 ENCOUNTER — Other Ambulatory Visit: Payer: Self-pay

## 2020-03-07 DIAGNOSIS — R739 Hyperglycemia, unspecified: Secondary | ICD-10-CM | POA: Diagnosis not present

## 2020-03-07 DIAGNOSIS — Z5321 Procedure and treatment not carried out due to patient leaving prior to being seen by health care provider: Secondary | ICD-10-CM | POA: Insufficient documentation

## 2020-03-07 DIAGNOSIS — R531 Weakness: Secondary | ICD-10-CM | POA: Diagnosis not present

## 2020-03-07 LAB — URINALYSIS, COMPLETE (UACMP) WITH MICROSCOPIC
Bacteria, UA: NONE SEEN
Bilirubin Urine: NEGATIVE
Glucose, UA: >=500 mg/dL — AB
Hgb urine dipstick: NEGATIVE
Ketones, ur: NEGATIVE mg/dL
Leukocytes,Ua: NEGATIVE
Nitrite: NEGATIVE
Protein, ur: NEGATIVE mg/dL
Specific Gravity, Urine: 1.026 (ref 1.005–1.030)
pH: 5 (ref 5.0–8.0)

## 2020-03-07 LAB — BASIC METABOLIC PANEL
Anion gap: 13 (ref 5–15)
BUN: 28 mg/dL — ABNORMAL HIGH (ref 8–23)
CO2: 21 mmol/L — ABNORMAL LOW (ref 22–32)
Calcium: 9.7 mg/dL (ref 8.9–10.3)
Chloride: 93 mmol/L — ABNORMAL LOW (ref 98–111)
Creatinine, Ser: 1.75 mg/dL — ABNORMAL HIGH (ref 0.61–1.24)
GFR, Estimated: 44 mL/min — ABNORMAL LOW (ref >60)
Glucose, Bld: 450 mg/dL — ABNORMAL HIGH (ref 70–99)
Potassium: 4.6 mmol/L (ref 3.5–5.1)
Sodium: 127 mmol/L — ABNORMAL LOW (ref 135–145)

## 2020-03-07 LAB — CBG MONITORING, ED: Glucose-Capillary: 438 mg/dL — ABNORMAL HIGH (ref 70–99)

## 2020-03-07 LAB — CBC
HCT: 39.4 % (ref 39.0–52.0)
Hemoglobin: 14.2 g/dL (ref 13.0–17.0)
MCH: 31.7 pg (ref 26.0–34.0)
MCHC: 36 g/dL (ref 30.0–36.0)
MCV: 87.9 fL (ref 80.0–100.0)
Platelets: 263 10*3/uL (ref 150–400)
RBC: 4.48 MIL/uL (ref 4.22–5.81)
RDW: 12.4 % (ref 11.5–15.5)
WBC: 10.2 10*3/uL (ref 4.0–10.5)
nRBC: 0 % (ref 0.0–0.2)

## 2020-03-07 NOTE — Telephone Encounter (Signed)
I finished the form and placed it in my out basket.

## 2020-03-07 NOTE — ED Triage Notes (Signed)
Patient states his blood sugar has been high over the past 3 days and he states he feels weak and "tingly in my head."  Patient reports feeling like he is going to pass out.  Grip strength is equal and smile is even.  Patient's speech is clear at this time.

## 2020-03-08 ENCOUNTER — Telehealth: Payer: Self-pay | Admitting: Internal Medicine

## 2020-03-08 NOTE — Telephone Encounter (Signed)
Completed form handed to [place] at the front desk.

## 2020-03-08 NOTE — Telephone Encounter (Signed)
FYI  Tried to call patient earlier this morning and unable to reach patient.  I have tried again and he stated that he is sitting the the ED because he feels worse such as tingling, fatigue, and nausea. He cannot give readings since he is at the ED, he thinks it is sugar and maybe getting sick with something then proceeded to hung up the phone.

## 2020-03-08 NOTE — Telephone Encounter (Signed)
Please schedule the pt for a follow up on his sugar readings, please remind pt to bring meter.    Thanks

## 2020-03-08 NOTE — Telephone Encounter (Signed)
Patient has had high blood sugars and was in the hospital for it - he wanted to know if a nurse could give him a call back. Ph# [phone]

## 2020-03-08 NOTE — Telephone Encounter (Signed)
Gave completed forms to [employer].

## 2020-03-08 NOTE — Telephone Encounter (Signed)
Noted. Will try to call patient later today again

## 2020-03-08 NOTE — Telephone Encounter (Signed)
Called to offer appointment for tomorrow - patient states since he's now in the ER he does not think can come tomorrow. Asked if Dr or Nurse could please give him a call again to discuss sugars.

## 2020-03-09 NOTE — Telephone Encounter (Signed)
Try to call patient this morning but patient return home early this morning from the ED.  Called patient this afternoon. He stated that his blood sugar this morning was 380. He stated that he was told to push more fluids and told to contact his PCP. He will contact us after he speaks with his PCP.

## 2020-03-10 ENCOUNTER — Telehealth: Payer: Self-pay | Admitting: Cardiovascular Disease

## 2020-03-10 MED ORDER — CLOPIDOGREL BISULFATE 75 MG PO TABS: 75.0000 mg | ORAL_TABLET | Freq: Every day | ORAL | 0 refills | Status: DC

## 2020-03-10 NOTE — Telephone Encounter (Signed)
Forms faxed to [employer] and patient made aware. Completed forms scanned into documents

## 2020-03-10 NOTE — Telephone Encounter (Signed)
Requested Prescriptions   Signed Prescriptions Disp Refills   clopidogrel (PLAVIX) 75 MG tablet 90 tablet 0    Sig: Take 1 tablet (75 mg total) by mouth daily.    Authorizing Provider: Theora Gianotti    Ordering User: Raelene Bott, Seann Genther L

## 2020-03-10 NOTE — Telephone Encounter (Signed)
*  STAT* If patient is at the pharmacy, call can be transferred to refill team.   1. Which medications need to be refilled? (please list name of each medication and dose if known) clopidogrel 75 MG daily  2. Which pharmacy/location (including street and city if local pharmacy) is medication to be sent to? [facility] on [address]  3. Do they need a 30 day or 90 day supply? 90 day

## 2020-04-03 ENCOUNTER — Other Ambulatory Visit: Payer: Self-pay | Admitting: Internal Medicine

## 2020-04-03 ENCOUNTER — Telehealth: Payer: Self-pay | Admitting: Internal Medicine

## 2020-04-03 MED ORDER — ONETOUCH VERIO VI STRP: ORAL_STRIP | 5 refills | Status: DC

## 2020-04-03 NOTE — Telephone Encounter (Signed)
I have called [facility] and was told them the information that patient have provided to [place]. They suggested to re-sent a new Rx to the [facility] pharmacy.   Message left for patient to return my call so he can call [facility] and check on the refill.

## 2020-04-03 NOTE — Telephone Encounter (Signed)
When pt went to go pick up his test strips from the Pharmacy they told him Dr.Shamleffer was not in the network with Medicaid so he could not get his test strips. Pt states they are $75 without insurance and he cannot afford that so he doesn't know what to do but states he needs his test strips.  [phone]

## 2020-04-04 ENCOUNTER — Other Ambulatory Visit: Payer: Self-pay

## 2020-04-04 MED ORDER — PIOGLITAZONE HCL 45 MG PO TABS: 45.0000 mg | ORAL_TABLET | Freq: Every day | ORAL | 1 refills | Status: DC

## 2020-04-04 MED ORDER — ACCU-CHEK GUIDE VI STRP: ORAL_STRIP | 5 refills | Status: DC

## 2020-04-04 MED ORDER — ACCU-CHEK GUIDE ME W/DEVICE KIT: PACK | 0 refills | Status: DC

## 2020-04-04 MED ORDER — ACCU-CHEK FASTCLIX LANCETS MISC: 5 refills | Status: AC

## 2020-04-04 NOTE — Addendum Note (Signed)
Addended by: Jacqualin Combes on: 04/04/2020 08:57 AM   Modules accepted: Orders

## 2020-04-04 NOTE — Telephone Encounter (Signed)
Patient called stating his insurance covers accu chek - he asked if we could call in the accu chek meter and strips.  Also asked if we could call in pioglitazone   PHARMACY:  [id number] [place], [place] Phone:  [phone]  Fax:  [phone]

## 2020-04-04 NOTE — Telephone Encounter (Signed)
Patient have been notified on 04/03/2020

## 2020-04-04 NOTE — Telephone Encounter (Signed)
Sent Accu-Check Guide me meter and test strips to [facility] pharmacy. Also sent pioglitazone as requested.

## 2020-04-05 ENCOUNTER — Encounter: Payer: Self-pay | Admitting: Internal Medicine

## 2020-04-05 ENCOUNTER — Ambulatory Visit (INDEPENDENT_AMBULATORY_CARE_PROVIDER_SITE_OTHER): Payer: Medicaid Other | Admitting: Internal Medicine

## 2020-04-05 VITALS — BP 112/70 | HR 83 | Ht 66.0 in | Wt 200.5 lb

## 2020-04-05 DIAGNOSIS — E1165 Type 2 diabetes mellitus with hyperglycemia: Secondary | ICD-10-CM

## 2020-04-05 LAB — POCT GLYCOSYLATED HEMOGLOBIN (HGB A1C): Hemoglobin A1C: 10 % — AB (ref 4.0–5.6)

## 2020-04-05 MED ORDER — METFORMIN HCL 1000 MG PO TABS: 1000.0000 mg | ORAL_TABLET | Freq: Every day | ORAL | 3 refills | Status: DC

## 2020-04-05 MED ORDER — DAPAGLIFLOZIN PROPANEDIOL 5 MG PO TABS: 5.0000 mg | ORAL_TABLET | Freq: Every day | ORAL | 6 refills | Status: DC

## 2020-04-05 MED ORDER — INSULIN PEN NEEDLE 32G X 4 MM MISC: 18.0000 [IU] | Freq: Every day | 3 refills | Status: DC

## 2020-04-05 MED ORDER — BASAGLAR KWIKPEN 100 UNIT/ML ~~LOC~~ SOPN: 18.0000 [IU] | PEN_INJECTOR | Freq: Every day | SUBCUTANEOUS | 6 refills | Status: DC

## 2020-04-05 NOTE — Patient Instructions (Addendum)
-   STOP Glipizide  - Start Farxiga 5 mg daily in the morning  - Start Basaglar 18 units daily  - Decrease Metformin 1000 mg ONCE daily  - Continue Pioglitazone 45 mg daily       HOW TO TREAT LOW BLOOD SUGARS (Blood sugar LESS THAN 70 MG/DL)  Please follow the RULE OF 15 for the treatment of hypoglycemia treatment (when your (blood sugars are less than 70 mg/dL)    STEP 1: Take 15 grams of carbohydrates when your blood sugar is low, which includes:   3-4 GLUCOSE TABS  OR  3-4 OZ OF JUICE OR REGULAR SODA OR  ONE TUBE OF GLUCOSE GEL     STEP 2: RECHECK blood sugar in 15 MINUTES STEP 3: If your blood sugar is still low at the 15 minute recheck --> then, go back to STEP 1 and treat AGAIN with another 15 grams of carbohydrates.

## 2020-04-05 NOTE — Progress Notes (Signed)
Name: ZACHERIE HONEYMAN  Age/ Sex: 60 y.o., male   MRN/ DOB: [id number], Nov 09, 1960     PCP: Cyndi Bender, PA-C   Reason for Endocrinology Evaluation: Type 2 Diabetes Mellitus  Initial Endocrine Consultative Visit: 11/03/2019    PATIENT IDENTIFIER: [name] is a 60 y.o. male with a past medical history of T2DM, CAD, OSA and HTN.  The patient has followed with Endocrinology clinic since 11/03/2019 for consultative assistance with management of his diabetes.  DIABETIC HISTORY:  Mr. Bhatnagar was diagnosed with DM many years ago. Trulicity has caused nausea. His hemoglobin A1c has ranged from 9.0% in 12/2019, peaking at 12.1% in 10/2019.  No personal hx of pancreatitis    On his initial visit to our clinic he had an A1c of 12.1 %. He was on metformin, Glipizide and pioglitazone. We increased Glipizide     By 12/2019 reduced metformin due to low GFR SUBJECTIVE:   During the last visit (12/31/2019): A1c 9.1% we increased glipizide, continue pioglitazone and decreased metformin due to low GFR   Today (04/05/2020): Mr. Riner is here for a follow up on diabetes.  He is accompanied by his wife Marlowe Kays. He checks his blood sugars 1-2 times daily. The patient has had hypoglycemic episodes since the last clinic visit.  He ended up in the ED in 02/2020 with hyperglycemia with a BG of 328 mg/dL. He was given IVF  Denies nausea/ vomiting or sob    HOME DIABETES REGIMEN:  Glipizide 10 mg 2 tabs BID Metformin 1000 mg daily - he is back to taking it twice daily  Pioglitazone 45 mg daily    Statin: yes ACE-I/ARB: no   METER DOWNLOAD SUMMARY: Date range evaluated: 2/2-2/16/2022  Average Number Tests/Day = 1.2 Overall Mean FS Glucose = 157 Standard Deviation = 59  BG Ranges: Low = 68 High = 255   Hypoglycemic Events/30 Days: BG < 50 = 0 Episodes of symptomatic severe hypoglycemia = 0        DIABETIC COMPLICATIONS: Microvascular complications:    Denies: Denies: CKD,  retinopathy, neuropathy   Last Eye Exam: Completed 01/2019  Macrovascular complications:   CAD ( S/P PCI)  Denies: CVA, PVD   HISTORY:  Past Medical History:  Past Medical History:  Diagnosis Date  . CKD (chronic kidney disease), stage III ([place])   . Coronary artery disease    a.  H/o NSTEMI s/p LAD/RCA stenting; b. 08/2019 PCI: RCA 99p (2.5x15 Resolute Onyx DES), 75p/m, mild ISR; c. 09/2019 PCI: 85p - heavily calcified (intravascular lithotripsy & 3.0x26 Resolute Onyx DES); d. 10/18/19 Relook cath: LM 25, LAD 8mISR, 75d, 70/50d/apical, RI 80, LCX 50p/m/d, RCA 30p ISR, 275mSR, 50d, RPDA 60-->Med Rx.  . [place] ([place]  . Diastolic dysfunction    a. 08/2019 Echo: EF 50-55%, no rwma, Gr1 DD, nl RV size/fxn. Triv MR.  . Marland KitchenERD (gastroesophageal reflux disease)   . Hyperlipidemia   . Hypertension   . [place]STEMI (non-ST elevated myocardial infarction) ([facility]  . Sleep apnea   . Tobacco abuse    Past Surgical History:  Past Surgical History:  Procedure Laterality Date  . APPENDECTOMY    . CARDIAC CATHETERIZATION    . COLONOSCOPY    . COLONOSCOPY WITH PROPOFOL N/A 09/16/2017   Procedure: COLONOSCOPY WITH PROPOFOL;  Surgeon: Toledo, TeBenay PikeMD;  Location: [facility] ENDOSCOPY;  Service: Gastroenterology;  Laterality: N/A;  (+) DM - oral  . CORONARY STENT INTERVENTION N/A 08/31/2019  Procedure: CORONARY STENT INTERVENTION;  Surgeon: Yolonda Kida, MD;  Location: [place] [facility];  Service: Cardiovascular;  Laterality: N/A;  . CORONARY STENT INTERVENTION  09/29/2019  . CORONARY STENT INTERVENTION N/A 09/29/2019   Procedure: CORONARY STENT INTERVENTION;  Surgeon: Wellington Hampshire, MD;  Location: [place] [facility];  Service: Cardiovascular;  Laterality: N/A;  . EXPLORATORY LAPAROTOMY    . EYE SURGERY    . HERNIA REPAIR    . LEFT HEART CATH AND CORONARY ANGIOGRAPHY N/A 08/31/2019   Procedure: LEFT HEART CATH AND CORONARY ANGIOGRAPHY possible PCI and  stent;  Surgeon: Yolonda Kida, MD;  Location: [place] [facility];  Service: Cardiovascular;  Laterality: N/A;  . LEFT HEART CATH AND CORONARY ANGIOGRAPHY Left 10/18/2019   Procedure: LEFT HEART CATH AND CORONARY ANGIOGRAPHY;  Surgeon: Wellington Hampshire, MD;  Location: [place] [facility];  Service: Cardiovascular;  Laterality: Left;    Social History:  reports that he has been smoking cigarettes. He has a 25.00 pack-year smoking history. He has never used smokeless tobacco. He reports that he does not drink alcohol and does not use drugs. Family History:  Family History  Problem Relation Age of Onset  . Heart disease Father   . Heart attack Father   . Hypertension Father   . Diabetes Father      HOME MEDICATIONS: Allergies as of 04/05/2020      Reactions   Shellfish Allergy Anaphylaxis   Penicillins Swelling   Childhood   Erythromycin Rash, Other (See Comments)   Unknown      Medication List       Accurate as of April 05, 2020  2:30 PM. If you have any questions, ask your nurse or doctor.        STOP taking these medications   glipiZIDE 10 MG tablet Commonly known as: GLUCOTROL Stopped by: Dorita Sciara, MD     TAKE these medications   Accu-Chek FastClix Lancets Misc Use as instructed to check blood sugar up to 2 times daily   Accu-Chek Guide Me w/Device Kit Use as instructed to check blood sugar up to 2 times daily   Accu-Chek Guide test strip Generic drug: glucose blood Use as instructed to check blood sugar up to 2 times daily   aspirin EC 81 MG tablet Take 81 mg by mouth daily.   atorvastatin 80 MG tablet Commonly known as: LIPITOR Take 80 mg by mouth daily.   Basaglar KwikPen 100 UNIT/ML Inject 18 Units into the skin daily. Started by: Dorita Sciara, MD   carvedilol 3.125 MG tablet Commonly known as: COREG Take 1 tablet (3.125 mg total) by mouth 2 (two) times daily with a meal.   Cinnamon 500 MG capsule Take 1,000 mg by  mouth daily.   clopidogrel 75 MG tablet Commonly known as: PLAVIX Take 1 tablet (75 mg total) by mouth daily.   dapagliflozin propanediol 5 MG Tabs tablet Commonly known as: Farxiga Take 1 tablet (5 mg total) by mouth daily. Started by: Dorita Sciara, MD   EPINEPHrine 0.3 mg/0.3 mL Soaj injection Commonly known as: EPI-PEN Inject 0.3 mg into the muscle as needed for anaphylaxis.   famotidine 20 MG tablet Commonly known as: PEPCID Take 20 mg by mouth daily.   hydrochlorothiazide 25 MG tablet Commonly known as: HYDRODIURIL Take 1 tablet (25 mg total) by mouth daily.   Insulin Pen Needle 32G X 4 MM Misc 18 Units by Does not apply route daily. Started  by: Dorita Sciara, MD   isosorbide mononitrate 60 MG 24 hr tablet Commonly known as: IMDUR Take 1.5 tablets (90 mg total) by mouth daily.   metFORMIN 1000 MG tablet Commonly known as: GLUCOPHAGE Take 1 tablet (1,000 mg total) by mouth daily with breakfast. Restart on 10/02/19. What changed: when to take this Changed by: Dorita Sciara, MD   nitroGLYCERIN 0.4 MG SL tablet Commonly known as: NITROSTAT Place 0.4 mg under the tongue every 5 (five) minutes as needed for chest pain.   pioglitazone 45 MG tablet Commonly known as: ACTOS Take 1 tablet (45 mg total) by mouth daily.   ranolazine 500 MG 12 hr tablet Commonly known as: RANEXA Take 500 mg by mouth 2 (two) times daily.   vitamin B-12 1000 MCG tablet Commonly known as: CYANOCOBALAMIN Take 1,000 mcg by mouth daily.        OBJECTIVE:   Vital Signs: BP 112/70   Pulse 83   Ht 5' 6"  (1.676 m)   Wt 200 lb 8 oz (90.9 kg)   SpO2 98%   BMI 32.36 kg/m   Wt Readings from Last 3 Encounters:  04/05/20 200 lb 8 oz (90.9 kg)  03/07/20 218 lb (98.9 kg)  12/31/19 218 lb 4 oz (99 kg)     Exam: General: Pt appears well and is in NAD  Lungs: Clear with good BS bilat with no rales, rhonchi, or wheezes  Heart: RRR   Abdomen: Normoactive bowel  sounds, soft, nontender, without masses or organomegaly palpable  Extremities: No pretibial edema.  Neuro: MS is good with appropriate affect, pt is alert and Ox3       DM foot exam: 11/03/2019  The skin of the feet is intact without sores or ulcerations. The pedal pulses are 2+ on right and 2+ on left. The sensation is intact to a screening 5.07, 10 gram monofilament bilaterally    DATA REVIEWED:  Lab Results  Component Value Date   HGBA1C 10.0 (A) 04/05/2020   HGBA1C 9.1 (A) 12/31/2019   HGBA1C 12.1 (H) 08/31/2019   Lab Results  Component Value Date   MICROALBUR 1.9 12/31/2019   LDLCALC UNABLE TO CALCULATE IF TRIGLYCERIDE OVER 400 mg/dL 09/01/2019   CREATININE 1.75 (H) 03/07/2020     Lab Results  Component Value Date   CHOL 170 12/31/2019   HDL 35.70 (L) 12/31/2019   LDLCALC UNABLE TO CALCULATE IF TRIGLYCERIDE OVER 400 mg/dL 09/01/2019   LDLDIRECT 74.0 12/31/2019   TRIG (H) 12/31/2019    434.0 Triglyceride is over 400; calculations on Lipids are invalid.   CHOLHDL 5 12/31/2019        Results for MAGUIRE, SIME (MRN [id number]) as of 04/05/2020 09:37  Ref. Range 03/07/2020 16:03  Sodium Latest Ref Range: 135 - 145 mmol/L 127 (L)  Potassium Latest Ref Range: 3.5 - 5.1 mmol/L 4.6  Chloride Latest Ref Range: 98 - 111 mmol/L 93 (L)  CO2 Latest Ref Range: 22 - 32 mmol/L 21 (L)  Glucose Latest Ref Range: 70 - 99 mg/dL 450 (H)  BUN Latest Ref Range: 8 - 23 mg/dL 28 (H)  Creatinine Latest Ref Range: 0.61 - 1.24 mg/dL 1.75 (H)  Calcium Latest Ref Range: 8.9 - 10.3 mg/dL 9.7  Anion gap Latest Ref Range: 5 - 15  13  GFR, Estimated Latest Ref Range: >60 mL/min 44 (L)   ASSESSMENT / PLAN / RECOMMENDATIONS:   1) Type 2 Diabetes Mellitus, Poorly  controlled, With CKD III and macrovascular  complications - Most recent A1c of 10.0  %. Goal A1c < 7.0.   - A1c worsening , pt with variable dietary indiscretions, this is evident  In reviewing of his meter download  He was noted to  have optimal BG's the week 2/3-03/29/2020 but in looking at his fasting BG's the week of 2/10-2/16/2022 they were as high as 242 mg/dL this drastic variabiity is only explained by variable dietary indiscretions.  - I had reduce his Metformin to 1000 mg ( down from 2000 mg)  Due to w GFR of 35 ,but he self increased it back up again. I explained to him the risk of  Metformin toxicity and acidosis if extra metformin taken in the setting of low GFR  - We held off on starting  SGLT-2 inhibitors on last visit due to low GFR but now its 44, will start , cautioned against genital infections  He is not a candidate for GLP-1 agonist and DPP-4 inhibitors at this time due to risk of pancreatitis and especially with a triglyceride level of > 500 mg/dL. Will consider once Tg trend down.  - I have advised him stop Glipizide and start insulin , which he is in agreement, he was shows on how to use the insulin pen today   - We again emphasized the importance of low carb diet   MEDICATIONS: - STOP Glipizide  - Start Farxiga 5 mg daily in the morning  - Start Basaglar 18 units daily  - Decrease Metformin 1000 mg ONCE daily  - Continue Pioglitazone 45 mg daily    EDUCATION / INSTRUCTIONS:  BG monitoring instructions: Patient is instructed to check his blood sugars 2 times a day, before breakfast and supper.  Call [place] [facility] if: BG persistently < 70  . I reviewed the Rule of 15 for the treatment of hypoglycemia in detail with the patient. Literature supplied.     2) Diabetic complications:   Eye: Does not have known diabetic retinopathy.   Neuro/ Feet: Does not have known diabetic peripheral neuropathy .   Renal: Patient does not have known baseline CKD. He   is not a candidate for  ACEI/ARB at present due to hyperkalemia . MA/Cr ratio is normal    3) Mixed Dyslipidemia: Patient is on atorvastatin 80 mg daily . TG improved from 755 to 434 mg/dL. Will continue to monitor. If no  improvement will consider add on therapy    F/U in 3 months   Signed electronically by: Mack Guise, MD  [facility]  [address] [place], [place] [address] Phone: [phone] FAX: [phone]   CC: [address] [place] [address] Phone: [phone]  Fax: [phone]  Return to Endocrinology clinic as below: Future Appointments  Date Time Provider [place]  04/13/2020  9:40 AM Wellington Hampshire, MD [facility]  07/05/2020  9:30 AM Kateleen Encarnacion, Melanie Crazier, MD [facility] None

## 2020-04-10 MED ORDER — PIOGLITAZONE HCL 45 MG PO TABS: 45.0000 mg | ORAL_TABLET | Freq: Every day | ORAL | 1 refills | Status: DC

## 2020-04-10 NOTE — Telephone Encounter (Signed)
Sent pioglitazone to [facility] as requested on fax on 04/08/2020

## 2020-04-10 NOTE — Addendum Note (Signed)
Addended by: Jacqualin Combes on: 04/10/2020 03:24 PM   Modules accepted: Orders

## 2020-04-11 ENCOUNTER — Telehealth: Payer: Self-pay | Admitting: Internal Medicine

## 2020-04-11 MED ORDER — LANTUS SOLOSTAR 100 UNIT/ML ~~LOC~~ SOPN: 18.0000 [IU] | PEN_INJECTOR | Freq: Every day | SUBCUTANEOUS | 11 refills | Status: DC

## 2020-04-11 NOTE — Telephone Encounter (Signed)
[name] with [facility] called stating pt could not get the Lantus because it is too expensive and so he needs a cheaper alternative. Pharmacist recommended he get a prescription for Novalin N flex pen because that will be what he can afford. Please send to:  [facility], [place] Phone:  [phone]  Fax:  [phone]

## 2020-04-11 NOTE — Telephone Encounter (Signed)
Patient LVM on after hours phone stating his insurance covers Lantus and also a basic generic insulin - patient is out of insulin and requests an Rx be called into:  [facility], [place] Phone:  [phone]  Fax:  [phone]

## 2020-04-11 NOTE — Telephone Encounter (Signed)
Re-sent Rx to [facility] as requested.

## 2020-04-11 NOTE — Telephone Encounter (Signed)
Patient called and is requesting that the RX for insulin glargine (Lantus) be set to the [facility] [address] in [place]. Patient has Medicaid Family Planning and it does not cover RX

## 2020-04-12 MED ORDER — NOVOLIN N FLEXPEN 100 UNIT/ML ~~LOC~~ SUPN: 9.0000 [IU] | PEN_INJECTOR | Freq: Two times a day (BID) | SUBCUTANEOUS | 11 refills | Status: DC

## 2020-04-12 NOTE — Telephone Encounter (Signed)
Please advise 

## 2020-04-12 NOTE — Addendum Note (Signed)
Addended by: Dorita Sciara on: 04/12/2020 04:58 PM   Modules accepted: Orders

## 2020-04-12 NOTE — Telephone Encounter (Signed)
Novolin N 9 units TWICE daily . He needs to take this twice a day as its not long acting as lantus is

## 2020-04-13 ENCOUNTER — Other Ambulatory Visit: Payer: Self-pay | Admitting: Internal Medicine

## 2020-04-13 ENCOUNTER — Encounter: Payer: Self-pay | Admitting: Cardiovascular Disease

## 2020-04-13 ENCOUNTER — Ambulatory Visit (INDEPENDENT_AMBULATORY_CARE_PROVIDER_SITE_OTHER): Payer: Self-pay | Admitting: Cardiovascular Disease

## 2020-04-13 ENCOUNTER — Other Ambulatory Visit: Payer: Self-pay

## 2020-04-13 ENCOUNTER — Ambulatory Visit: Payer: 59 | Admitting: Cardiovascular Disease

## 2020-04-13 VITALS — BP 100/70 | HR 83 | Ht 66.0 in | Wt 199.2 lb

## 2020-04-13 DIAGNOSIS — Z720 Tobacco use: Secondary | ICD-10-CM

## 2020-04-13 DIAGNOSIS — E782 Mixed hyperlipidemia: Secondary | ICD-10-CM

## 2020-04-13 DIAGNOSIS — I10 Essential (primary) hypertension: Secondary | ICD-10-CM

## 2020-04-13 DIAGNOSIS — I25118 Atherosclerotic heart disease of native coronary artery with other forms of angina pectoris: Secondary | ICD-10-CM

## 2020-04-13 MED ORDER — NOVOLIN N FLEXPEN 100 UNIT/ML ~~LOC~~ SUPN: 9.0000 [IU] | PEN_INJECTOR | Freq: Two times a day (BID) | SUBCUTANEOUS | 11 refills | Status: DC

## 2020-04-13 NOTE — Progress Notes (Signed)
Cardiology Office Note   Date:  04/13/2020   ID:  [name], DOB 02-Mar-1960, MRN [id number]  PCP:  Cyndi Bender, PA-C  Cardiologist:  Kathlyn Sacramento, MD   Chief Complaint  Patient presents with  . 3 month follow up     Patient c/o shortness of breath and chest pain. Medications reviewed by the patient verbally.       History of Present Illness: [name] is a 60 y.o. male who presents for a follow-up visit regarding coronary artery disease and stable angina. He has known history of uncontrolled diabetes, essential hypertension, hyperlipidemia, obesity, sleep apnea, tobacco use and GERD. He has remote history of non-STEMI status post stenting of the mid right coronary artery.  He presented in July, 2021 with unstable angina.  He underwent cardiac catheterization which showed patent LAD stent with mild restenosis.  There was 80% stenosis in the distal LAD, 80% stenosis in the ramus branch and moderate left circumflex disease.  The RCA had severe proximal and mid disease with significant in-stent restenosis in the midsegment.  The proximal portion was heavily calcified and tortuous.  There was moderate distal RCA stenosis and 60 to 70% ostial right PDA stenosis.  Dr. Clayborn Bigness attempted RCA PCI but was able to place only a proximal RCA stent.  No other balloons could not be passed in the proximal to mid segment which was left to be treated medically but the patient continued to have angina.  Thus, the patient was referred to me to attempt RCA PCI.  This was done at [facility] on August 11.  The procedure was extremely difficult due to calcifications and tortuosity but I was able to do intravascular lithotripsy and drug eluting stent placement to the proximal right coronary artery.  He continued to have angina after the procedure and thus he underwent repeat catheterization which showed no significant change in coronary anatomy.  The stents were patent. He had stable symptoms since that time  and was placed on more antianginal therapy.  He went to the emergency room and January with fatigue and elevated blood sugar.  He had acute on chronic renal failure with creatinine of 2.4.  Improved with hydration.  He reports stable exertional dyspnea with overexertion with associated shortness of breath.  He gets dizzy and lightheaded frequently as his blood pressure is on the low side.  His diabetes is still uncontrolled and he continues to smoke.  Past Medical History:  Diagnosis Date  . CKD (chronic kidney disease), stage III ([place])   . Coronary artery disease    a.  H/o NSTEMI s/p LAD/RCA stenting; b. 08/2019 PCI: RCA 99p (2.5x15 Resolute Onyx DES), 75p/m, mild ISR; c. 09/2019 PCI: 85p - heavily calcified (intravascular lithotripsy & 3.0x26 Resolute Onyx DES); d. 10/18/19 Relook cath: LM 25, LAD 73mISR, 75d, 70/50d/apical, RI 80, LCX 50p/m/d, RCA 30p ISR, 243mSR, 50d, RPDA 60-->Med Rx.  . Diabetes mellitus without complication ([facility]  . Diastolic dysfunction    a. 08/2019 Echo: EF 50-55%, no rwma, Gr1 DD, nl RV size/fxn. Triv MR.  . Marland KitchenERD (gastroesophageal reflux disease)   . Hyperlipidemia   . Hypertension   . NSTEMI (non-ST elevated myocardial infarction) ([place]  . Sleep apnea   . Tobacco abuse     Past Surgical History:  Procedure Laterality Date  . APPENDECTOMY    . CARDIAC CATHETERIZATION    . COLONOSCOPY    . COLONOSCOPY WITH PROPOFOL N/A 09/16/2017   Procedure:  COLONOSCOPY WITH PROPOFOL;  Surgeon: Toledo, Benay Pike, MD;  Location: [facility] ENDOSCOPY;  Service: Gastroenterology;  Laterality: N/A;  (+) DM - oral  . CORONARY STENT INTERVENTION N/A 08/31/2019   Procedure: CORONARY STENT INTERVENTION;  Surgeon: Yolonda Kida, MD;  Location: [place] [facility];  Service: Cardiovascular;  Laterality: N/A;  . CORONARY STENT INTERVENTION  09/29/2019  . CORONARY STENT INTERVENTION N/A 09/29/2019   Procedure: CORONARY STENT INTERVENTION;  Surgeon: Wellington Hampshire, MD;  Location:  [place] [facility];  Service: Cardiovascular;  Laterality: N/A;  . EXPLORATORY LAPAROTOMY    . EYE SURGERY    . HERNIA REPAIR    . LEFT HEART CATH AND CORONARY ANGIOGRAPHY N/A 08/31/2019   Procedure: LEFT HEART CATH AND CORONARY ANGIOGRAPHY possible PCI and stent;  Surgeon: Yolonda Kida, MD;  Location: [place] [facility];  Service: Cardiovascular;  Laterality: N/A;  . LEFT HEART CATH AND CORONARY ANGIOGRAPHY Left 10/18/2019   Procedure: LEFT HEART CATH AND CORONARY ANGIOGRAPHY;  Surgeon: Wellington Hampshire, MD;  Location: [place] [facility];  Service: Cardiovascular;  Laterality: Left;     Current Outpatient Medications  Medication Sig Dispense Refill  . Accu-Chek FastClix Lancets MISC Use as instructed to check blood sugar up to 2 times daily 102 each 5  . aspirin EC 81 MG tablet Take 81 mg by mouth daily.    Marland Kitchen atorvastatin (LIPITOR) 80 MG tablet Take 80 mg by mouth daily.    . Blood Glucose Monitoring Suppl (ACCU-CHEK GUIDE ME) w/Device KIT Use as instructed to check blood sugar up to 2 times daily 1 kit 0  . carvedilol (COREG) 3.125 MG tablet Take 1 tablet (3.125 mg total) by mouth 2 (two) times daily with a meal. 180 tablet 3  . Cinnamon 500 MG capsule Take 1,000 mg by mouth daily.    . clopidogrel (PLAVIX) 75 MG tablet Take 1 tablet (75 mg total) by mouth daily. 90 tablet 0  . dapagliflozin propanediol (FARXIGA) 5 MG TABS tablet Take 1 tablet (5 mg total) by mouth daily. 30 tablet 6  . EPINEPHrine 0.3 mg/0.3 mL IJ SOAJ injection Inject 0.3 mg into the muscle as needed for anaphylaxis.    . famotidine (PEPCID) 20 MG tablet Take 20 mg by mouth daily.    Marland Kitchen glucose blood (ACCU-CHEK GUIDE) test strip Use as instructed to check blood sugar up to 2 times daily 100 each 5  . Insulin NPH, Human,, Isophane, (NOVOLIN N FLEXPEN) 100 UNIT/ML Kiwkpen Inject 9 Units into the skin 2 (two) times daily. 15 mL 11  . Insulin Pen Needle 32G X 4 MM MISC 18 Units by Does not apply route daily. 100 each  3  . isosorbide mononitrate (IMDUR) 60 MG 24 hr tablet Take 1.5 tablets (90 mg total) by mouth daily. 45 tablet 5  . metFORMIN (GLUCOPHAGE) 1000 MG tablet Take 1 tablet (1,000 mg total) by mouth daily with breakfast. Restart on 10/02/19. 90 tablet 3  . nitroGLYCERIN (NITROSTAT) 0.4 MG SL tablet Place 0.4 mg under the tongue every 5 (five) minutes as needed for chest pain.     . pioglitazone (ACTOS) 45 MG tablet Take 1 tablet (45 mg total) by mouth daily. 90 tablet 1  . ranolazine (RANEXA) 500 MG 12 hr tablet Take 500 mg by mouth 2 (two) times daily.    . vitamin B-12 (CYANOCOBALAMIN) 1000 MCG tablet Take 1,000 mcg by mouth daily.     No current facility-administered medications for this visit.  Allergies:   Shellfish allergy, Penicillins, and Erythromycin    Social History:  The patient  reports that he has been smoking cigarettes. He has a 25.00 pack-year smoking history. He has never used smokeless tobacco. He reports that he does not drink alcohol and does not use drugs.   Family History:  The patient's family history includes Diabetes in his father; Heart attack in his father; Heart disease in his father; Hypertension in his father.    ROS:  Please see the history of present illness.   Otherwise, review of systems are positive for none.   All other systems are reviewed and negative.    PHYSICAL EXAM: VS:  BP 100/70 (BP Location: Left Arm, Patient Position: Sitting, Cuff Size: Normal)   Pulse 83   Ht _0  (1.676 m)   Wt 199 lb 4 oz (90.4 kg)   SpO2 98%   BMI 32.16 kg/m  , BMI Body mass index is 32.16 kg/m. GEN: Well nourished, well developed, in no acute distress  HEENT: normal  Neck: no JVD, carotid bruits, or masses Cardiac: RRR; no murmurs, rubs, or gallops,no edema  Respiratory:  clear to auscultation bilaterally, normal work of breathing GI: soft, nontender, nondistended, + BS MS: no deformity or atrophy  Skin: warm and dry, no rash Neuro:  Strength and sensation are  intact Psych: euthymic mood, full affect   EKG:  EKG is ordered today. The ekg ordered today demonstrates normal sinus rhythm.  No significant ST or T wave changes.   Recent Labs: 03/07/2020: BUN 28; Creatinine, Ser 1.75; Hemoglobin 14.2; Platelets 263; Potassium 4.6; Sodium 127    Lipid Panel    Component Value Date/Time   CHOL 170 12/31/2019 1031   TRIG (H) 12/31/2019 1031    434.0 Triglyceride is over 400; calculations on Lipids are invalid.   HDL 35.70 (L) 12/31/2019 1031   CHOLHDL 5 12/31/2019 1031   VLDL UNABLE TO CALCULATE IF TRIGLYCERIDE OVER 400 mg/dL 09/01/2019 0450   LDLCALC UNABLE TO CALCULATE IF TRIGLYCERIDE OVER 400 mg/dL 09/01/2019 0450   LDLDIRECT 74.0 12/31/2019 1031      Wt Readings from Last 3 Encounters:  04/13/20 199 lb 4 oz (90.4 kg)  04/05/20 200 lb 8 oz (90.9 kg)  03/07/20 218 lb (98.9 kg)      No flowsheet data found.    ASSESSMENT AND PLAN:  1.  Coronary artery disease involving native coronary arteries with stable angina: He is doing reasonably well overall with controlled symptoms on medications.  He is currently [place] heart association class II.  2.  Mixed hyperlipidemia: Continue high-dose atorvastatin.  He has high triglyceride likely due to uncontrolled diabetes.  We offered him Vascepa in the past but was concerned about cost.  3.  Essential hypertension: Blood pressure is in the low side.  I discontinued hydrochlorothiazide today.  If additional blood pressure control is needed in the future, it would make more sense to place him on an ACE inhibitor or ARB given underlying diabetes.  4.  Tobacco use: I discussed with him the importance of smoking cessation.  5.  Morbid obesity: I discussed with him the importance of healthy lifestyle changes.  6.  Uncontrolled type 2 diabetes: He is working with his endocrinologist for better control.    Disposition:   FU with me in 6 month  Signed,  Kathlyn Sacramento, MD  04/13/2020 8:18 AM     [place]

## 2020-04-13 NOTE — Telephone Encounter (Signed)
Spoken to patient and notified Dr Shamleffer's comments. Verbalized understanding.   

## 2020-04-13 NOTE — Patient Instructions (Signed)
Medication Instructions:  Your physician has recommended you make the following change in your medication:   STOP HCTZ (hydrochlorithiazide)   *If you need a refill on your cardiac medications before your next appointment, please call your pharmacy*   Lab Work: None ordered If you have labs (blood work) drawn today and your tests are completely normal, you will receive your results only by: Marland Kitchen MyChart Message (if you have MyChart) OR . A paper copy in the mail If you have any lab test that is abnormal or we need to change your treatment, we will call you to review the results.   Testing/Procedures: None ordered   Follow-Up: At [facility], you and your health needs are our priority.  As part of our continuing mission to provide you with exceptional heart care, we have created designated Provider Care Teams.  These Care Teams include your primary Cardiologist (physician) and Advanced Practice Providers (APPs -  Physician Assistants and Nurse Practitioners) who all work together to provide you with the care you need, when you need it.  We recommend signing up for the patient portal called "MyChart".  Sign up information is provided on this After Visit Summary.  MyChart is used to connect with patients for Virtual Visits (Telemedicine).  Patients are able to view lab/test results, encounter notes, upcoming appointments, etc.  Non-urgent messages can be sent to your provider as well.   To learn more about what you can do with MyChart, go to [email or web address].    Your next appointment:   6 month(s)  The format for your next appointment:   In Person  Provider:   You may see Kathlyn Sacramento, MD or one of the following Advanced Practice Providers on your designated Care Team:    Murray Hodgkins, NP  Christell Faith, PA-C  Marrianne Mood, PA-C  Cadence Chapman, [place]  Laurann Montana, NP    Other Instructions N/A

## 2020-05-04 DIAGNOSIS — Z0279 Encounter for issue of other medical certificate: Secondary | ICD-10-CM

## 2020-05-05 ENCOUNTER — Telehealth: Payer: Self-pay | Admitting: Cardiovascular Disease

## 2020-05-05 NOTE — Telephone Encounter (Signed)
Patient came by office  Dropped off [employer] disability forms to be completed  Patient filled out forms and made $29 payment in cash Placed in nurse box

## 2020-05-05 NOTE — Telephone Encounter (Addendum)
Filled out and placed on Dr. Tyrell Antonio desk to be signed.

## 2020-05-11 NOTE — Telephone Encounter (Signed)
Completed form given to Ashley G. 

## 2020-06-28 ENCOUNTER — Telehealth: Payer: Self-pay | Admitting: Cardiovascular Disease

## 2020-06-28 MED ORDER — CLOPIDOGREL BISULFATE 75 MG PO TABS: 75.0000 mg | ORAL_TABLET | Freq: Every day | ORAL | 3 refills | Status: DC

## 2020-06-28 NOTE — Telephone Encounter (Signed)
*  STAT* If patient is at the pharmacy, call can be transferred to refill team.   1. Which medications need to be refilled? (please list name of each medication and dose if known)    CLOPIDOGREL 75 MG PO Q D   2. Which pharmacy/location (including street and city if local pharmacy) is medication to be sent to? [facility] [place]   3. Do they need a 30 day or 90 day supply? [place]

## 2020-07-04 NOTE — Progress Notes (Signed)
Name: [name]  Age/ Sex: 60 y.o., male   MRN/ DOB: [id number], [birth year]     PCP: Cyndi Bender, PA-C   Reason for Endocrinology Evaluation: Type 2 Diabetes Mellitus  Initial Endocrine Consultative Visit: 11/03/2019    PATIENT IDENTIFIER: [name] is a 60 y.o. male with a past medical history of T2DM, CAD, OSA and HTN.  The patient has followed with Endocrinology clinic since 11/03/2019 for consultative assistance with management of his diabetes.  DIABETIC HISTORY:  [name] was diagnosed with DM many years ago. Trulicity has caused nausea. His hemoglobin A1c has ranged from 9.0% in 12/2019, peaking at 12.1% in 10/2019.  No personal hx of pancreatitis    On his initial visit to our clinic he had an A1c of 12.1 %. He was on metformin, Glipizide and pioglitazone. We increased Glipizide     By 12/2019 reduced metformin due to low GFR    Stopped Glipizide and started basal insulin  SUBJECTIVE:   During the last visit (12/31/2019): A1c 9.1% we increased glipizide, continue pioglitazone and decreased metformin due to low GFR   Today (07/05/2020): Mr. Leon is here for a follow up on diabetes.  He is accompanied by his wife [name]. He checks his blood sugars occasionally. The patient has not had hypoglycemic episodes since the last clinic visit.    Denies nausea/ vomiting      HOME DIABETES REGIMEN:  Metformin 1000 mg daily  Pioglitazone 45 mg daily  Farxiga 5 mg daily in the morning  Novolin- N 9 units BID      Statin: yes ACE-I/ARB: no   METER DOWNLOAD SUMMARY: Date range evaluated: 5/4-5/18/2022  Average Number Tests/Day = 0.2 Overall Mean FS Glucose = 229 Standard Deviation = 80  BG Ranges: Low = 161 High = 317   Hypoglycemic Events/30 Days: BG < 50 = 0 Episodes of symptomatic severe hypoglycemia = 0        DIABETIC COMPLICATIONS: Microvascular complications:    Denies: Denies: CKD, retinopathy, neuropathy   Last Eye Exam:  Completed 01/2019  Macrovascular complications:   CAD ( S/P PCI)  Denies: CVA, PVD   HISTORY:  Past Medical History:  Past Medical History:  Diagnosis Date  . CKD (chronic kidney disease), stage III ([place])   . Coronary artery disease    a.  H/o NSTEMI s/p LAD/RCA stenting; b. 08/2019 PCI: RCA 99p (2.5x15 Resolute Onyx DES), 75p/m, mild ISR; c. 09/2019 PCI: 85p - heavily calcified (intravascular lithotripsy & 3.0x26 Resolute Onyx DES); d. 10/18/19 Relook cath: LM 25, LAD 64mISR, 75d, 70/50d/apical, RI 80, LCX 50p/m/d, RCA 30p ISR, 226mSR, 50d, RPDA 60-->Med Rx.  . Diabetes mellitus without complication ([facility]  . Diastolic dysfunction    a. 08/2019 Echo: EF 50-55%, no rwma, Gr1 DD, nl RV size/fxn. Triv MR.  . Marland KitchenERD (gastroesophageal reflux disease)   . Hyperlipidemia   . Hypertension   . NSTEMI (non-ST elevated myocardial infarction) ([facility]  . Sleep apnea   . Tobacco abuse    Past Surgical History:  Past Surgical History:  Procedure Laterality Date  . APPENDECTOMY    . CARDIAC CATHETERIZATION    . COLONOSCOPY    . COLONOSCOPY WITH PROPOFOL N/A 09/16/2017   Procedure: COLONOSCOPY WITH PROPOFOL;  Surgeon: Toledo, TeBenay PikeMD;  Location: [facility]COPY;  Service: Gastroenterology;  Laterality: N/A;  (+) DM - oral  . CORONARY STENT INTERVENTION N/A 08/31/2019   Procedure: CORONARY STENT INTERVENTION;  Surgeon: CaClayborn Bigness  Loran Senters, MD;  Location: [place] [facility];  Service: Cardiovascular;  Laterality: N/A;  . CORONARY STENT INTERVENTION  09/29/2019  . CORONARY STENT INTERVENTION N/A 09/29/2019   Procedure: CORONARY STENT INTERVENTION;  Surgeon: Wellington Hampshire, MD;  Location: [place] [facility];  Service: Cardiovascular;  Laterality: N/A;  . EXPLORATORY LAPAROTOMY    . EYE SURGERY    . HERNIA REPAIR    . LEFT HEART CATH AND CORONARY ANGIOGRAPHY N/A 08/31/2019   Procedure: LEFT HEART CATH AND CORONARY ANGIOGRAPHY possible PCI and stent;  Surgeon: Yolonda Kida, MD;   Location: [place] [facility];  Service: Cardiovascular;  Laterality: N/A;  . LEFT HEART CATH AND CORONARY ANGIOGRAPHY Left 10/18/2019   Procedure: LEFT HEART CATH AND CORONARY ANGIOGRAPHY;  Surgeon: Wellington Hampshire, MD;  Location: [place] [facility];  Service: Cardiovascular;  Laterality: Left;    Social History:  reports that he has been smoking cigarettes. He has a 25.00 pack-year smoking history. He has never used smokeless tobacco. He reports that he does not drink alcohol and does not use drugs. Family History:  Family History  Problem Relation Age of Onset  . Heart disease Father   . Heart attack Father   . Hypertension Father   . Diabetes Father      HOME MEDICATIONS: Allergies as of 07/05/2020      Reactions   Shellfish Allergy Anaphylaxis   Penicillins Swelling   Childhood   Erythromycin Rash, Other (See Comments)   Unknown      Medication List       Accurate as of Jul 05, 2020  9:44 AM. If you have any questions, ask your nurse or doctor.        STOP taking these medications   NovoLIN N FlexPen 100 UNIT/ML Kiwkpen Generic drug: Insulin NPH (Human) ([place]) Stopped by: Dorita Sciara, MD     TAKE these medications   Accu-Chek FastClix Lancets Misc Use as instructed to check blood sugar up to 2 times daily   Accu-Chek Guide Me w/Device Kit Use as instructed to check blood sugar up to 2 times daily   aspirin EC 81 MG tablet Take 81 mg by mouth daily.   atorvastatin 80 MG tablet Commonly known as: LIPITOR Take 80 mg by mouth daily.   carvedilol 3.125 MG tablet Commonly known as: COREG Take 1 tablet (3.125 mg total) by mouth 2 (two) times daily with a meal.   Cinnamon 500 MG capsule Take 1,000 mg by mouth daily.   clopidogrel 75 MG tablet Commonly known as: PLAVIX Take 1 tablet (75 mg total) by mouth daily.   dapagliflozin propanediol 5 MG Tabs tablet Commonly known as: Farxiga Take 1 tablet (5 mg total) by mouth daily.    EPINEPHrine 0.3 mg/0.3 mL Soaj injection Commonly known as: EPI-PEN Inject 0.3 mg into the muscle as needed for anaphylaxis.   famotidine 20 MG tablet Commonly known as: PEPCID Take 20 mg by mouth daily.   Insulin Pen Needle 32G X 4 MM Misc 18 Units by Does not apply route daily.   isosorbide mononitrate 60 MG 24 hr tablet Commonly known as: IMDUR Take 1.5 tablets (90 mg total) by mouth daily.   metFORMIN 1000 MG tablet Commonly known as: GLUCOPHAGE Take 1 tablet (1,000 mg total) by mouth daily with breakfast. Restart on 10/02/19.   nitroGLYCERIN 0.4 MG SL tablet Commonly known as: NITROSTAT Place 0.4 mg under the tongue every 5 (five) minutes as needed for  chest pain.   OneTouch Verio test strip Generic drug: glucose blood 1 each by Other route daily. Use as instructed What changed:   how much to take  how to take this  when to take this  additional instructions Changed by: Dorita Sciara, MD   pioglitazone 45 MG tablet Commonly known as: ACTOS Take 1 tablet (45 mg total) by mouth daily.   ranolazine 500 MG 12 hr tablet Commonly known as: RANEXA Take 500 mg by mouth 2 (two) times daily.   Tyler Aas FlexTouch 100 UNIT/ML FlexTouch Pen Generic drug: insulin degludec Inject 22 Units into the skin daily. Started by: Dorita Sciara, MD   vitamin B-12 1000 MCG tablet Commonly known as: CYANOCOBALAMIN Take 1,000 mcg by mouth daily.        OBJECTIVE:   Vital Signs: BP 114/72   Pulse 88   Ht 5' 6"  (1.676 m)   Wt 204 lb 2 oz (92.6 kg)   SpO2 98%   BMI 32.95 kg/m   Wt Readings from Last 3 Encounters:  07/05/20 204 lb 2 oz (92.6 kg)  04/13/20 199 lb 4 oz (90.4 kg)  04/05/20 200 lb 8 oz (90.9 kg)     Exam: General: Pt appears well and is in NAD  Lungs: Clear with good BS bilat with no rales, rhonchi, or wheezes  Heart: RRR   Abdomen: Normoactive bowel sounds, soft, nontender, without masses or organomegaly palpable  Extremities: No  pretibial edema.  Neuro: MS is good with appropriate affect, pt is alert and Ox3       DM foot exam: 11/03/2019  The skin of the feet is intact without sores or ulcerations. The pedal pulses are 2+ on right and 2+ on left. The sensation is intact to a screening 5.07, 10 gram monofilament bilaterally    DATA REVIEWED:  Lab Results  Component Value Date   HGBA1C 9.1 (A) 07/05/2020   HGBA1C 10.0 (A) 04/05/2020   HGBA1C 9.1 (A) 12/31/2019   Lab Results  Component Value Date   MICROALBUR 1.9 12/31/2019   LDLCALC UNABLE TO CALCULATE IF TRIGLYCERIDE OVER 400 mg/dL 09/01/2019   CREATININE 1.75 (H) 03/07/2020     Lab Results  Component Value Date   CHOL 170 12/31/2019   HDL 35.70 (L) 12/31/2019   LDLCALC UNABLE TO CALCULATE IF TRIGLYCERIDE OVER 400 mg/dL 09/01/2019   LDLDIRECT 74.0 12/31/2019   TRIG (H) 12/31/2019    434.0 Triglyceride is over 400; calculations on Lipids are invalid.   CHOLHDL 5 12/31/2019        Results for GRABIEL, SCHMUTZ (MRN [id number]) as of 04/05/2020 09:37  Ref. Range 03/07/2020 16:03  Sodium Latest Ref Range: 135 - 145 mmol/L 127 (L)  Potassium Latest Ref Range: 3.5 - 5.1 mmol/L 4.6  Chloride Latest Ref Range: 98 - 111 mmol/L 93 (L)  CO2 Latest Ref Range: 22 - 32 mmol/L 21 (L)  Glucose Latest Ref Range: 70 - 99 mg/dL 450 (H)  BUN Latest Ref Range: 8 - 23 mg/dL 28 (H)  Creatinine Latest Ref Range: 0.61 - 1.24 mg/dL 1.75 (H)  Calcium Latest Ref Range: 8.9 - 10.3 mg/dL 9.7  Anion gap Latest Ref Range: 5 - 15  13  GFR, Estimated Latest Ref Range: >60 mL/min 44 (L)   ASSESSMENT / PLAN / RECOMMENDATIONS:   1) Type 2 Diabetes Mellitus, Poorly  controlled, With CKD III and macrovascular  complications - Most recent A1c of 9.1 %. Goal A1c < 7.0.   -  A1c trending down  - He was unable to get insulin analogues initially due to insurance issues, he currently has Medicaid and will switch Novolin-N to tresiba - He was unable to obtain [place], but will submit a  new prescription due to change in insurance, cautioned against genital infections   He is not a candidate for GLP-1 agonist and DPP-4 inhibitors at this time due to risk of pancreatitis and especially with a triglyceride level of > 500 mg/dL. Will consider once Tg trend down.    - We again emphasized the importance of low carb diet   MEDICATIONS:  - Start Farxiga 5 mg daily in the morning  - Start Tresiba 22 units daily / Or Novolin-N 11 units twice daily  - Continue Metformin 1000 mg ONCE daily  - Continue Pioglitazone 45 mg daily    EDUCATION / INSTRUCTIONS:  BG monitoring instructions: Patient is instructed to check his blood sugars 2 times a day, before breakfast and supper.  Call [facility] if: BG persistently < 70  . I reviewed the Rule of 15 for the treatment of hypoglycemia in detail with the patient. Literature supplied.     2) Diabetic complications:   Eye: Does not have known diabetic retinopathy.   Neuro/ Feet: Does not have known diabetic peripheral neuropathy .   Renal: Patient does not have known baseline CKD. He   is not a candidate for  ACEI/ARB at present due to hyperkalemia . MA/Cr ratio is normal    3) Mixed Dyslipidemia: Patient is on atorvastatin 80 mg daily . TG improved from 755 to 434 mg/dL. Will continue to monitor. If no improvement will consider add on therapy    F/U in 3 months   Signed electronically by: Mack Guise, MD  [facility]  [facility] [address] [place], [place] [address] Phone: [phone] FAX: [phone]   CC: [address] [place] [address] Phone: [phone]  Fax: [phone]  Return to Endocrinology clinic as below: Future Appointments  Date Time Provider [place]  10/24/2020  2:00 PM Wellington Hampshire, MD [facility]

## 2020-07-05 ENCOUNTER — Ambulatory Visit (INDEPENDENT_AMBULATORY_CARE_PROVIDER_SITE_OTHER): Payer: Medicaid Other | Admitting: Internal Medicine

## 2020-07-05 ENCOUNTER — Other Ambulatory Visit: Payer: Self-pay

## 2020-07-05 ENCOUNTER — Encounter: Payer: Self-pay | Admitting: Internal Medicine

## 2020-07-05 ENCOUNTER — Telehealth: Payer: Self-pay | Admitting: Internal Medicine

## 2020-07-05 VITALS — BP 114/72 | HR 88 | Ht 66.0 in | Wt 204.1 lb

## 2020-07-05 DIAGNOSIS — E1165 Type 2 diabetes mellitus with hyperglycemia: Secondary | ICD-10-CM

## 2020-07-05 DIAGNOSIS — E1122 Type 2 diabetes mellitus with diabetic chronic kidney disease: Secondary | ICD-10-CM | POA: Diagnosis not present

## 2020-07-05 DIAGNOSIS — E1159 Type 2 diabetes mellitus with other circulatory complications: Secondary | ICD-10-CM | POA: Diagnosis not present

## 2020-07-05 DIAGNOSIS — E782 Mixed hyperlipidemia: Secondary | ICD-10-CM | POA: Diagnosis not present

## 2020-07-05 DIAGNOSIS — N1831 Chronic kidney disease, stage 3a: Secondary | ICD-10-CM

## 2020-07-05 LAB — POCT GLYCOSYLATED HEMOGLOBIN (HGB A1C): Hemoglobin A1C: 9.1 % — AB (ref 4.0–5.6)

## 2020-07-05 MED ORDER — DAPAGLIFLOZIN PROPANEDIOL 5 MG PO TABS: 5.0000 mg | ORAL_TABLET | Freq: Every day | ORAL | 6 refills | Status: DC

## 2020-07-05 MED ORDER — TRESIBA FLEXTOUCH 100 UNIT/ML ~~LOC~~ SOPN: 22.0000 [IU] | PEN_INJECTOR | Freq: Every day | SUBCUTANEOUS | 6 refills | Status: DC

## 2020-07-05 MED ORDER — ONETOUCH VERIO VI STRP: 1.0000 | ORAL_STRIP | Freq: Every day | 3 refills | Status: DC

## 2020-07-05 NOTE — Telephone Encounter (Signed)
Noted  

## 2020-07-05 NOTE — Telephone Encounter (Signed)
Patient called to advise that [place] and Tyler Aas will need PA's completed

## 2020-07-05 NOTE — Patient Instructions (Addendum)
-   Start Farxiga 5 mg daily in the morning  - Start Tresiba 22 units ONCE daily / Novolin-N 11 units TWICE daily  - Continue  Metformin 1000 mg ONCE daily  - Continue Pioglitazone 45 mg daily       HOW TO TREAT LOW BLOOD SUGARS (Blood sugar LESS THAN 70 MG/DL)  Please follow the RULE OF 15 for the treatment of hypoglycemia treatment (when your (blood sugars are less than 70 mg/dL)    STEP 1: Take 15 grams of carbohydrates when your blood sugar is low, which includes:   3-4 GLUCOSE TABS  OR  3-4 OZ OF JUICE OR REGULAR SODA OR  ONE TUBE OF GLUCOSE GEL     STEP 2: RECHECK blood sugar in 15 MINUTES STEP 3: If your blood sugar is still low at the 15 minute recheck --> then, go back to STEP 1 and treat AGAIN with another 15 grams of carbohydrates.

## 2020-07-14 ENCOUNTER — Telehealth: Payer: Self-pay | Admitting: Cardiovascular Disease

## 2020-07-14 NOTE — Telephone Encounter (Signed)
*  STAT* If patient is at the pharmacy, call can be transferred to refill team.   1. Which medications need to be refilled? (please list name of each medication and dose if known) Farxiga, 1 tablet daily  2. Which pharmacy/location (including street and city if local pharmacy) is medication to be sent to? [facility], [place]  3. Do they need a 30 day or 90 day supply? 90 day

## 2020-07-14 NOTE — Telephone Encounter (Signed)
dapagliflozin propanediol (FARXIGA) 5 MG TABS tablet 30 tablet 6 07/05/2020    Sig - Route: Take 1 tablet (5 mg total) by mouth daily. - Oral   Sent to pharmacy as: dapagliflozin propanediol (FARXIGA) 5 MG Tab tablet   E-Prescribing Status: Receipt confirmed by pharmacy (07/05/2020  9:39 AM EDT)     Pharmacy  [facility] [place], [place]

## 2020-07-14 NOTE — Telephone Encounter (Signed)
Oley Balm- cover my meds is calling regarding needing a PA for [name] The provider needs to contact the back of the insurance because the Pa was unable to be processed

## 2020-07-14 NOTE — Telephone Encounter (Signed)
reference number [id number]

## 2020-07-18 ENCOUNTER — Other Ambulatory Visit: Payer: Self-pay

## 2020-07-18 MED ORDER — RANOLAZINE ER 500 MG PO TB12: 500.0000 mg | ORAL_TABLET | Freq: Two times a day (BID) | ORAL | 1 refills | Status: DC

## 2020-07-18 NOTE — Telephone Encounter (Signed)
Patient requests to be called at ph# [phone] re: Status of PA's for Farxiga & Tyler Aas per telephone encounter Jul 05, 2020

## 2020-07-18 NOTE — Telephone Encounter (Signed)
Please advise if ok to refill Ranexa 500 mg bid for 90 days last filled by historical provider.

## 2020-07-18 NOTE — Telephone Encounter (Signed)
*  STAT* If patient is at the pharmacy, call can be transferred to refill team.   1. Which medications need to be refilled? (please list name of each medication and dose if known) Ranexa  2. Which pharmacy/location (including street and city if local pharmacy) is medication to be sent to? [facility]  3. Do they need a 30 day or 90 day supply? [place]

## 2020-07-20 NOTE — Telephone Encounter (Signed)
Patient requests to be called at ph# [phone] re: Status of PA's for Wilder Glade & Tyler Aas

## 2020-07-26 NOTE — Telephone Encounter (Signed)
Dr Kelton Pillar,  I have been back and forth calling the [facility] [employer]. I was told that I could not complete the PA on CoverMyMeds and I needed to call the insurance for the form.  I have called 2 times and was told the form was fax but did not received anything.  I have called today and finally got someone who told the form is available on their website.  Please advise

## 2020-07-26 NOTE — Telephone Encounter (Signed)
Pt calling to f/u on last message.  

## 2020-07-26 NOTE — Telephone Encounter (Signed)
Dr Cruzita Lederer,  I hate to ask you. What should we do for patient?

## 2020-07-26 NOTE — Telephone Encounter (Signed)
Message left for patient to return my call.  

## 2020-07-26 NOTE — Telephone Encounter (Signed)
Patient called again and is requesting to be called at ph# [phone] re: Status of PA's for Farxiga & Tyler Aas

## 2020-07-26 NOTE — Telephone Encounter (Signed)
Due to the back-and-forth, I would probably try to complete a PA on the phone, while talking to an agent.  If this is not possible, try to fill the form available online,as they said.  We gave him a sample of [place] until we work this out, but we do not have [place].

## 2020-07-27 NOTE — Telephone Encounter (Signed)
[facility] pharmacy at [phone]  Spent 48 minutes with rep to process both PA for [place] and [place]

## 2020-07-31 ENCOUNTER — Telehealth: Payer: Self-pay | Admitting: *Deleted

## 2020-07-31 ENCOUNTER — Telehealth: Payer: Self-pay | Admitting: Pharmacy Technician

## 2020-07-31 NOTE — Telephone Encounter (Signed)
[facility] Patient Advocate Encounter  Prior Authorization for Wilder Glade has been approved.    PA# [id number] Effective dates: 07/27/2020 through 07/27/2021.     [facility] will continue to follow.   Venida Jarvis. Nadara Mustard, CPhT Patient Advocate [facility] Phone: [phone] Fax:  [phone]

## 2020-07-31 NOTE — Telephone Encounter (Signed)
[name] flextouch pen-inj 100 unit/ML soln - Farxiga 5mg  tablet PA approved 07/27/20-07/27/21. Notified pt and fax the info to [employer].

## 2020-10-11 ENCOUNTER — Other Ambulatory Visit: Payer: Self-pay

## 2020-10-11 ENCOUNTER — Telehealth: Payer: Self-pay | Admitting: Cardiovascular Disease

## 2020-10-11 MED ORDER — CARVEDILOL 3.125 MG PO TABS: 3.1250 mg | ORAL_TABLET | Freq: Two times a day (BID) | ORAL | 3 refills | Status: DC

## 2020-10-11 NOTE — Telephone Encounter (Signed)
*  STAT* If patient is at the pharmacy, call can be transferred to refill team.   1. Which medications need to be refilled? (please list name of each medication and dose if known) Carvedilol  2. Which pharmacy/location (including street and city if local pharmacy) is medication to be sent to? [facility]  3. Do they need a 30 day or 90 day supply?[place]

## 2020-10-11 NOTE — Telephone Encounter (Signed)
carvedilol (COREG) 3.125 MG tablet 180 tablet 3 10/11/2020    Sig - Route: Take 1 tablet (3.125 mg total) by mouth 2 (two) times daily with a meal. - Oral   Sent to pharmacy as: carvedilol (COREG) 3.125 MG tablet   E-Prescribing Status: Receipt confirmed by pharmacy (10/11/2020 12:03 PM EDT)    Pharmacy  [facility] [id number] - [address], [place]

## 2020-10-11 NOTE — Telephone Encounter (Signed)
*  STAT* If patient is at the pharmacy, call can be transferred to refill team.   1. Which medications need to be refilled? (please list name of each medication and dose if known)   Carvedilol 3.125 mg po BID  2. Which pharmacy/location (including street and city if local pharmacy) is medication to be sent to?  [facility]   3. Do they need a 30 day or 90 day supply? [place]

## 2020-10-18 ENCOUNTER — Other Ambulatory Visit: Payer: Self-pay

## 2020-10-18 ENCOUNTER — Other Ambulatory Visit: Payer: Self-pay | Admitting: Internal Medicine

## 2020-10-18 MED ORDER — CARVEDILOL 3.125 MG PO TABS: 3.1250 mg | ORAL_TABLET | Freq: Two times a day (BID) | ORAL | 3 refills | Status: DC

## 2020-10-24 ENCOUNTER — Encounter: Payer: Self-pay | Admitting: Cardiovascular Disease

## 2020-10-24 ENCOUNTER — Other Ambulatory Visit: Payer: Self-pay

## 2020-10-24 ENCOUNTER — Ambulatory Visit (INDEPENDENT_AMBULATORY_CARE_PROVIDER_SITE_OTHER): Payer: Medicaid Other | Admitting: Cardiovascular Disease

## 2020-10-24 VITALS — BP 110/64 | HR 83 | Ht 66.0 in | Wt 207.4 lb

## 2020-10-24 DIAGNOSIS — I10 Essential (primary) hypertension: Secondary | ICD-10-CM

## 2020-10-24 DIAGNOSIS — I25758 Atherosclerosis of native coronary artery of transplanted heart with other forms of angina pectoris: Secondary | ICD-10-CM | POA: Diagnosis not present

## 2020-10-24 DIAGNOSIS — E782 Mixed hyperlipidemia: Secondary | ICD-10-CM | POA: Diagnosis not present

## 2020-10-24 DIAGNOSIS — Z720 Tobacco use: Secondary | ICD-10-CM

## 2020-10-24 DIAGNOSIS — E1165 Type 2 diabetes mellitus with hyperglycemia: Secondary | ICD-10-CM

## 2020-10-24 MED ORDER — RANOLAZINE ER 500 MG PO TB12: 500.0000 mg | ORAL_TABLET | Freq: Two times a day (BID) | ORAL | 3 refills | Status: DC

## 2020-10-24 MED ORDER — METOPROLOL SUCCINATE ER 25 MG PO TB24
25.0000 mg | ORAL_TABLET | Freq: Every day | ORAL | 2 refills | Status: DC
Start: 2020-10-24 — End: 2021-05-17

## 2020-10-24 MED ORDER — ISOSORBIDE MONONITRATE ER 60 MG PO TB24: 60.0000 mg | ORAL_TABLET | Freq: Every day | ORAL | 2 refills | Status: DC

## 2020-10-24 NOTE — Progress Notes (Signed)
Cardiology Office Note   Date:  10/24/2020   ID:  [name], DOB 1961/01/31, MRN [id number]  PCP:  Cyndi Bender, PA-C  Cardiologist:  Kathlyn Sacramento, MD   Chief Complaint  Patient presents with   Other    6 month f/u c/o body tingling and feeling dizzy. Meds reviewed verbally with pt.      History of Present Illness: [name] is a 60 y.o. male who presents for a follow-up visit regarding coronary artery disease and stable angina. He has known history of uncontrolled diabetes, essential hypertension, hyperlipidemia, obesity, sleep apnea, tobacco use and GERD. He has remote history of non-STEMI status post stenting of the mid right coronary artery.  He presented in July, 2021 with unstable angina.  He underwent cardiac catheterization which showed patent LAD stent with mild restenosis.  There was 80% stenosis in the distal LAD, 80% stenosis in the ramus branch and moderate left circumflex disease.  The RCA had severe proximal and mid disease with significant in-stent restenosis in the midsegment.  The proximal portion was heavily calcified and tortuous.  There was moderate distal RCA stenosis and 60 to 70% ostial right PDA stenosis.  Dr. Clayborn Bigness attempted RCA PCI but was able to place only a proximal RCA stent.  No other balloons could not be passed in the proximal to mid segment which was left to be treated medically but the patient continued to have angina.  Thus, the patient was referred to me to attempt RCA PCI.  This was done at [facility] in August 2021..  The procedure was extremely difficult due to calcifications and tortuosity but I was able to do intravascular lithotripsy and drug eluting stent placement to the proximal right coronary artery.  He continued to have angina after the procedure and thus he underwent repeat catheterization which showed no significant change in coronary anatomy.  The stents were patent. He had stable symptoms since that time and was placed on more  antianginal therapy.   During last visit, he was noted to be hypotensive with increased dizziness.  Thus, hydrochlorothiazide was discontinued.  He reports stable anginal symptoms overall with overexertion with associated dyspnea.  He still complains of dizziness especially with standing up. Unfortunately, his son died in 08-07-2022 of complications related to colon cancer.  The patient started smoking more after that.  Past Medical History:  Diagnosis Date   CKD (chronic kidney disease), stage III (HCC)    Coronary artery disease    a.  H/o NSTEMI s/p LAD/RCA stenting; b. 08/2019 PCI: RCA 99p (2.5x15 Resolute Onyx DES), 75p/m, mild ISR; c. 09/2019 PCI: 85p - heavily calcified (intravascular lithotripsy & 3.0x26 Resolute Onyx DES); d. 10/18/19 Relook cath: LM 25, LAD 39mISR, 75d, 70/50d/apical, RI 80, LCX 50p/m/d, RCA 30p ISR, 286mSR, 50d, RPDA 60-->Med Rx.   Diabetes mellitus without complication (HCC)    Diastolic dysfunction    a. 08/2019 Echo: EF 50-55%, no rwma, Gr1 DD, nl RV size/fxn. Triv MR.   GERD (gastroesophageal reflux disease)    Hyperlipidemia    Hypertension    NSTEMI (non-ST elevated myocardial infarction) ([facility]/2021   Sleep apnea    Tobacco abuse     Past Surgical History:  Procedure Laterality Date   APPENDECTOMY     CARDIAC CATHETERIZATION     COLONOSCOPY     COLONOSCOPY WITH PROPOFOL N/A 09/16/2017   Procedure: COLONOSCOPY WITH PROPOFOL;  Surgeon: Toledo, TeBenay PikeMD;  Location: [facility] ENDOSCOPY;  Service:  Gastroenterology;  Laterality: N/A;  (+) DM - oral   CORONARY STENT INTERVENTION N/A 08/31/2019   Procedure: CORONARY STENT INTERVENTION;  Surgeon: Yolonda Kida, MD;  Location: [place] [facility];  Service: Cardiovascular;  Laterality: N/A;   CORONARY STENT INTERVENTION  09/29/2019   CORONARY STENT INTERVENTION N/A 09/29/2019   Procedure: CORONARY STENT INTERVENTION;  Surgeon: Wellington Hampshire, MD;  Location: [place] [facility];  Service: Cardiovascular;   Laterality: N/A;   EXPLORATORY LAPAROTOMY     EYE SURGERY     HERNIA REPAIR     LEFT HEART CATH AND CORONARY ANGIOGRAPHY N/A 08/31/2019   Procedure: LEFT HEART CATH AND CORONARY ANGIOGRAPHY possible PCI and stent;  Surgeon: Yolonda Kida, MD;  Location: [place] [facility];  Service: Cardiovascular;  Laterality: N/A;   LEFT HEART CATH AND CORONARY ANGIOGRAPHY Left 10/18/2019   Procedure: LEFT HEART CATH AND CORONARY ANGIOGRAPHY;  Surgeon: Wellington Hampshire, MD;  Location: [place] [facility];  Service: Cardiovascular;  Laterality: Left;     Current Outpatient Medications  Medication Sig Dispense Refill   Accu-Chek FastClix Lancets MISC Use as instructed to check blood sugar up to 2 times daily 102 each 5   aspirin EC 81 MG tablet Take 81 mg by mouth daily.     atorvastatin (LIPITOR) 80 MG tablet Take 80 mg by mouth daily.     Blood Glucose Monitoring Suppl (ACCU-CHEK GUIDE ME) w/Device KIT Use as instructed to check blood sugar up to 2 times daily 1 kit 0   carvedilol (COREG) 3.125 MG tablet Take 1 tablet (3.125 mg total) by mouth 2 (two) times daily with a meal. 180 tablet 3   Cinnamon 500 MG capsule Take 1,000 mg by mouth daily.     clopidogrel (PLAVIX) 75 MG tablet Take 1 tablet (75 mg total) by mouth daily. 90 tablet 3   dapagliflozin propanediol (FARXIGA) 5 MG TABS tablet Take 1 tablet (5 mg total) by mouth daily. 30 tablet 6   EPINEPHrine 0.3 mg/0.3 mL IJ SOAJ injection Inject 0.3 mg into the muscle as needed for anaphylaxis.     famotidine (PEPCID) 20 MG tablet Take 20 mg by mouth daily.     glucose blood (ONETOUCH VERIO) test strip 1 each by Other route daily. Use as instructed 150 each 3   insulin degludec (TRESIBA FLEXTOUCH) 100 UNIT/ML FlexTouch Pen Inject 22 Units into the skin daily. 15 mL 6   Insulin Pen Needle 32G X 4 MM MISC 18 Units by Does not apply route daily. 100 each 3   isosorbide mononitrate (IMDUR) 120 MG 24 hr tablet Take 120 mg by mouth daily.     metFORMIN  (GLUCOPHAGE) 1000 MG tablet Take 1 tablet (1,000 mg total) by mouth daily with breakfast. Restart on 10/02/19. (Patient taking differently: Take 1,000 mg by mouth 2 (two) times daily with a meal. Restart on 10/02/19.) 90 tablet 3   nitroGLYCERIN (NITROSTAT) 0.4 MG SL tablet Place 0.4 mg under the tongue every 5 (five) minutes as needed for chest pain.      Omega-3 Fatty Acids (FISH OIL) 1000 MG CAPS Take by mouth daily at 8 pm.     pioglitazone (ACTOS) 45 MG tablet TAKE ONE TABLET BY MOUTH DAILY 90 tablet 1   ranolazine (RANEXA) 500 MG 12 hr tablet Take 1 tablet (500 mg total) by mouth 2 (two) times daily. 180 tablet 1   vitamin B-12 (CYANOCOBALAMIN) 1000 MCG tablet Take 1,000 mcg by mouth daily.  No current facility-administered medications for this visit.    Allergies:   Shellfish allergy, Penicillins, and Erythromycin    Social History:  The patient  reports that he has been smoking cigarettes. He has a 25.00 pack-year smoking history. He has never used smokeless tobacco. He reports that he does not drink alcohol and does not use drugs.   Family History:  The patient's family history includes Diabetes in his father; Heart attack in his father; Heart disease in his father; Hypertension in his father.    ROS:  Please see the history of present illness.   Otherwise, review of systems are positive for none.   All other systems are reviewed and negative.    PHYSICAL EXAM: VS:  BP 110/64 (BP Location: Left Arm, Patient Position: Sitting, Cuff Size: Normal)   Pulse 83   Ht 5' 6"  (1.676 m)   Wt 207 lb 6 oz (94.1 kg)   SpO2 99%   BMI 33.47 kg/m  , BMI Body mass index is 33.47 kg/m. GEN: Well nourished, well developed, in no acute distress  HEENT: normal  Neck: no JVD, carotid bruits, or masses Cardiac: RRR; no murmurs, rubs, or gallops,no edema  Respiratory:  clear to auscultation bilaterally, normal work of breathing GI: soft, nontender, nondistended, + BS MS: no deformity or atrophy   Skin: warm and dry, no rash Neuro:  Strength and sensation are intact Psych: euthymic mood, full affect   EKG:  EKG is ordered today. The ekg ordered today demonstrates sinus rhythm with PVCs.   Recent Labs: 03/07/2020: BUN 28; Creatinine, Ser 1.75; Hemoglobin 14.2; Platelets 263; Potassium 4.6; Sodium 127    Lipid Panel    Component Value Date/Time   CHOL 170 12/31/2019 1031   TRIG (H) 12/31/2019 1031    434.0 Triglyceride is over 400; calculations on Lipids are invalid.   HDL 35.70 (L) 12/31/2019 1031   CHOLHDL 5 12/31/2019 1031   VLDL UNABLE TO CALCULATE IF TRIGLYCERIDE OVER 400 mg/dL 09/01/2019 0450   LDLCALC UNABLE TO CALCULATE IF TRIGLYCERIDE OVER 400 mg/dL 09/01/2019 0450   LDLDIRECT 74.0 12/31/2019 1031      Wt Readings from Last 3 Encounters:  10/24/20 207 lb 6 oz (94.1 kg)  07/05/20 204 lb 2 oz (92.6 kg)  04/13/20 199 lb 4 oz (90.4 kg)      No flowsheet data found.    ASSESSMENT AND PLAN:  1.  Coronary artery disease involving native coronary arteries with stable angina: He continues to be New York Heart Association class II.  Recommend continuing medical therapy.  I refilled his Ranexa today.  2.  Mixed hyperlipidemia: Most recent LDL was 74.  Recommend continuing high-dose atorvastatin.  He does have elevated triglyceride that correlates with uncontrolled diabetes.  We suggested Vascepa in the past but the patient declined.  3.  Essential hypertension: The patient continues to have orthostatic dizziness.  Thus, I elected to decrease Imdur to 60 mg daily and also changed carvedilol to Toprol-XL 25 mg once daily.  4.  Tobacco use: I discussed with him the importance of smoking cessation.  5.  Morbid obesity: I discussed with him the importance of healthy lifestyle changes.  6.  Uncontrolled type 2 diabetes: This seems to be gradually improving.  7.  PVCs: These are noted in his EKG and by physical exam.  He denies palpitations but this might be  contributing to dizziness.  I elected to switch carvedilol to Toprol to see if he responds better.  Disposition:   FU with me in 6 month  Signed,  Kathlyn Sacramento, MD  10/24/2020 2:12 PM    [facility]

## 2020-10-24 NOTE — Patient Instructions (Signed)
Medication Instructions:  Your physician has recommended you make the following change in your medication:   1) REDUCE Imdur to 60 mg daily 2) STOP Carvedilol 3) START Metoprolol Succinate 25 mg daily    *If you need a refill on your cardiac medications before your next appointment, please call your pharmacy*   Lab Work: None ordered  If you have labs (blood work) drawn today and your tests are completely normal, you will receive your results only by: [place] (if you have MyChart) OR A paper copy in the mail If you have any lab test that is abnormal or we need to change your treatment, we will call you to review the results.   Testing/Procedures: None ordered   Follow-Up: At [facility], you and your health needs are our priority.  As part of our continuing mission to provide you with exceptional heart care, we have created designated Provider Care Teams.  These Care Teams include your primary Cardiologist (physician) and Advanced Practice Providers (APPs -  Physician Assistants and Nurse Practitioners) who all work together to provide you with the care you need, when you need it.  We recommend signing up for the patient portal called "MyChart".  Sign up information is provided on this After Visit Summary.  MyChart is used to connect with patients for Virtual Visits (Telemedicine).  Patients are able to view lab/test results, encounter notes, upcoming appointments, etc.  Non-urgent messages can be sent to your provider as well.   To learn more about what you can do with MyChart, go to [email or web address].    Your next appointment:   6 month(s)  The format for your next appointment:   In Person  Provider:   You may see Kathlyn Sacramento, MD or one of the following Advanced Practice Providers on your designated Care Team:   Murray Hodgkins, NP Christell Faith, PA-C Marrianne Mood, PA-C Cadence Kathlen Mody, [place]   Other Instructions N/A

## 2020-10-25 ENCOUNTER — Telehealth: Payer: Self-pay | Admitting: Cardiovascular Disease

## 2020-10-25 DIAGNOSIS — Z0279 Encounter for issue of other medical certificate: Secondary | ICD-10-CM

## 2020-10-25 NOTE — Telephone Encounter (Signed)
Recieved request from : patient for [employer] [employer]   Patient signed ROI and paid $29 fee   Scanned to documents and placed in nurse box

## 2020-11-06 ENCOUNTER — Ambulatory Visit (INDEPENDENT_AMBULATORY_CARE_PROVIDER_SITE_OTHER): Payer: Medicaid Other | Admitting: Internal Medicine

## 2020-11-06 ENCOUNTER — Encounter: Payer: Self-pay | Admitting: Internal Medicine

## 2020-11-06 ENCOUNTER — Other Ambulatory Visit: Payer: Self-pay

## 2020-11-06 VITALS — BP 126/80 | HR 99 | Ht 66.0 in | Wt 208.0 lb

## 2020-11-06 DIAGNOSIS — E782 Mixed hyperlipidemia: Secondary | ICD-10-CM | POA: Diagnosis not present

## 2020-11-06 DIAGNOSIS — E1165 Type 2 diabetes mellitus with hyperglycemia: Secondary | ICD-10-CM

## 2020-11-06 DIAGNOSIS — E1159 Type 2 diabetes mellitus with other circulatory complications: Secondary | ICD-10-CM | POA: Diagnosis not present

## 2020-11-06 DIAGNOSIS — E1122 Type 2 diabetes mellitus with diabetic chronic kidney disease: Secondary | ICD-10-CM | POA: Diagnosis not present

## 2020-11-06 DIAGNOSIS — N1831 Chronic kidney disease, stage 3a: Secondary | ICD-10-CM

## 2020-11-06 LAB — GLUCOSE, POCT (MANUAL RESULT ENTRY): POC Glucose: 144 mg/dl — AB (ref 70–99)

## 2020-11-06 LAB — POCT GLYCOSYLATED HEMOGLOBIN (HGB A1C): Hemoglobin A1C: 10.9 % — AB (ref 4.0–5.6)

## 2020-11-06 MED ORDER — PIOGLITAZONE HCL 45 MG PO TABS: 45.0000 mg | ORAL_TABLET | Freq: Every day | ORAL | 3 refills | Status: DC

## 2020-11-06 MED ORDER — DAPAGLIFLOZIN PROPANEDIOL 10 MG PO TABS: 10.0000 mg | ORAL_TABLET | Freq: Every day | ORAL | 2 refills | Status: DC

## 2020-11-06 MED ORDER — TRESIBA FLEXTOUCH 100 UNIT/ML ~~LOC~~ SOPN: 26.0000 [IU] | PEN_INJECTOR | Freq: Every day | SUBCUTANEOUS | 3 refills | Status: DC

## 2020-11-06 MED ORDER — LISINOPRIL 2.5 MG PO TABS: 2.5000 mg | ORAL_TABLET | Freq: Every day | ORAL | 1 refills | Status: DC

## 2020-11-06 NOTE — Patient Instructions (Signed)
-   Increase Farxiga 10 mg daily in the morning  - Increase Tresiba 26  units ONCE daily  - Continue  Metformin 1000 mg ONCE daily  - Continue Pioglitazone 45 mg daily      HOW TO TREAT LOW BLOOD SUGARS (Blood sugar LESS THAN 70 MG/DL) Please follow the RULE OF 15 for the treatment of hypoglycemia treatment (when your (blood sugars are less than 70 mg/dL)   STEP 1: Take 15 grams of carbohydrates when your blood sugar is low, which includes:  3-4 GLUCOSE TABS  OR 3-4 OZ OF JUICE OR REGULAR SODA OR ONE TUBE OF GLUCOSE GEL    STEP 2: RECHECK blood sugar in 15 MINUTES STEP 3: If your blood sugar is still low at the 15 minute recheck --> then, go back to STEP 1 and treat AGAIN with another 15 grams of carbohydrates.

## 2020-11-06 NOTE — Progress Notes (Signed)
Name: KEIJI MELLAND  Age/ Sex: 60 y.o., male   MRN/ DOB: [id number], [birth year]     PCP: Cyndi Bender, PA-C   Reason for Endocrinology Evaluation: Type 2 Diabetes Mellitus  Initial Endocrine Consultative Visit: 11/03/2019    PATIENT IDENTIFIER: Mr. ROCKY GLADDEN is a 60 y.o. male with a past medical history of T2DM, CAD, OSA and HTN.  The patient has followed with Endocrinology clinic since 11/03/2019 for consultative assistance with management of his diabetes.  DIABETIC HISTORY:  Mr. Alen was diagnosed with DM many years ago. Trulicity has caused nausea. His hemoglobin A1c has ranged from 9.0% in 12/2019, peaking at 12.1% in 10/2019.  No personal hx of pancreatitis    On his initial visit to our clinic he had an A1c of 12.1 %. He was on metformin, Glipizide and pioglitazone. We increased Glipizide     By 12/2019 reduced metformin due to low GFR    Stopped Glipizide and started basal insulin 06/2020 as well as [place]     SUBJECTIVE:   During the last visit (07/05/2020): A1c 9.1% we continue pioglitazone and decreased metformin due to low GFR, started basal insulin and Farxiga    Today (11/06/2020): Mr. Carroll is here for a follow up on diabetes.  He is accompanied by his wife Marlowe Kays. He checks his blood sugars occasionally. The patient has not had hypoglycemic episodes since the last clinic visit.    Denies nausea/ vomiting  He has been tired   He admits to [place]:  Metformin 1000 mg daily  Pioglitazone 45 mg daily  Farxiga 5 mg daily in the morning  Tresiba 22 units daily      Statin: yes ACE-I/ARB: no   METER DOWNLOAD SUMMARY: Did not bring         DIABETIC COMPLICATIONS: Microvascular complications:   Denies: Denies: CKD, retinopathy, neuropathy  Last Eye Exam: Completed 01/2020  Macrovascular complications:  CAD ( S/P PCI) Denies: CVA, PVD   HISTORY:  Past Medical History:  Past Medical History:   Diagnosis Date   CKD (chronic kidney disease), stage III ([place])    Coronary artery disease    a.  H/o NSTEMI s/p LAD/RCA stenting; b. 08/2019 PCI: RCA 99p (2.5x15 Resolute Onyx DES), 75p/m, mild ISR; c. 09/2019 PCI: 85p - heavily calcified (intravascular lithotripsy & 3.0x26 Resolute Onyx DES); d. 10/18/19 Relook cath: LM 25, LAD 24m ISR, 75d, 70/50d/apical, RI 80, LCX 50p/m/d, RCA 30p ISR, 55m ISR, 50d, RPDA 60-->Med Rx.   Diabetes mellitus without complication (HCC)    Diastolic dysfunction    a. 08/2019 Echo: EF 50-55%, no rwma, Gr1 DD, nl RV size/fxn. Triv MR.   GERD (gastroesophageal reflux disease)    Hyperlipidemia    Hypertension    NSTEMI (non-ST elevated myocardial infarction) ([place]) 08/2019   Sleep apnea    Tobacco abuse    Past Surgical History:  Past Surgical History:  Procedure Laterality Date   APPENDECTOMY     CARDIAC CATHETERIZATION     COLONOSCOPY     COLONOSCOPY WITH PROPOFOL N/A 09/16/2017   Procedure: COLONOSCOPY WITH PROPOFOL;  Surgeon: Toledo, Benay Pike, MD;  Location: [facility] ENDOSCOPY;  Service: Gastroenterology;  Laterality: N/A;  (+) DM - oral   CORONARY STENT INTERVENTION N/A 08/31/2019   Procedure: CORONARY STENT INTERVENTION;  Surgeon: Yolonda Kida, MD;  Location: [place] [facility];  Service: Cardiovascular;  Laterality: N/A;   CORONARY STENT INTERVENTION  09/29/2019   CORONARY  STENT INTERVENTION N/A 09/29/2019   Procedure: CORONARY STENT INTERVENTION;  Surgeon: Wellington Hampshire, MD;  Location: [place] [facility];  Service: Cardiovascular;  Laterality: N/A;   EXPLORATORY LAPAROTOMY     EYE SURGERY     HERNIA REPAIR     LEFT HEART CATH AND CORONARY ANGIOGRAPHY N/A 08/31/2019   Procedure: LEFT HEART CATH AND CORONARY ANGIOGRAPHY possible PCI and stent;  Surgeon: Yolonda Kida, MD;  Location: [place] [facility];  Service: Cardiovascular;  Laterality: N/A;   LEFT HEART CATH AND CORONARY ANGIOGRAPHY Left 10/18/2019   Procedure: LEFT HEART CATH AND  CORONARY ANGIOGRAPHY;  Surgeon: Wellington Hampshire, MD;  Location: [place] [facility];  Service: Cardiovascular;  Laterality: Left;   Social History:  reports that he has been smoking cigarettes. He has a 25.00 pack-year smoking history. He has never used smokeless tobacco. He reports that he does not drink alcohol and does not use drugs. Family History:  Family History  Problem Relation Age of Onset   Heart disease Father    Heart attack Father    Hypertension Father    Diabetes Father      HOME MEDICATIONS: Allergies as of 11/06/2020       Reactions   Shellfish Allergy Anaphylaxis   Penicillins Swelling   Childhood   Erythromycin Rash, Other (See Comments)   Unknown        Medication List        Accurate as of November 06, 2020  7:28 AM. If you have any questions, ask your nurse or doctor.          Accu-Chek FastClix Lancets Misc Use as instructed to check blood sugar up to 2 times daily   Accu-Chek Guide Me w/Device Kit Use as instructed to check blood sugar up to 2 times daily   aspirin EC 81 MG tablet Take 81 mg by mouth daily.   atorvastatin 80 MG tablet Commonly known as: LIPITOR Take 80 mg by mouth daily.   Cinnamon 500 MG capsule Take 1,000 mg by mouth daily.   clopidogrel 75 MG tablet Commonly known as: PLAVIX Take 1 tablet (75 mg total) by mouth daily.   dapagliflozin propanediol 5 MG Tabs tablet Commonly known as: Farxiga Take 1 tablet (5 mg total) by mouth daily.   EPINEPHrine 0.3 mg/0.3 mL Soaj injection Commonly known as: EPI-PEN Inject 0.3 mg into the muscle as needed for anaphylaxis.   famotidine 20 MG tablet Commonly known as: PEPCID Take 20 mg by mouth daily.   Fish Oil 1000 MG Caps Take by mouth daily at 8 pm.   Insulin Pen Needle 32G X 4 MM Misc 18 Units by Does not apply route daily.   isosorbide mononitrate 60 MG 24 hr tablet Commonly known as: IMDUR Take 1 tablet (60 mg total) by mouth daily.   metFORMIN 1000 MG  tablet Commonly known as: GLUCOPHAGE Take 1 tablet (1,000 mg total) by mouth daily with breakfast. Restart on 10/02/19. What changed: when to take this   metoprolol succinate 25 MG 24 hr tablet Commonly known as: Toprol XL Take 1 tablet (25 mg total) by mouth daily.   nitroGLYCERIN 0.4 MG SL tablet Commonly known as: NITROSTAT Place 0.4 mg under the tongue every 5 (five) minutes as needed for chest pain.   OneTouch Verio test strip Generic drug: glucose blood 1 each by Other route daily. Use as instructed   pioglitazone 45 MG tablet Commonly known as: ACTOS TAKE ONE TABLET BY  MOUTH DAILY   ranolazine 500 MG 12 hr tablet Commonly known as: RANEXA Take 1 tablet (500 mg total) by mouth 2 (two) times daily.   Tyler Aas FlexTouch 100 UNIT/ML FlexTouch Pen Generic drug: insulin degludec Inject 22 Units into the skin daily.   vitamin B-12 1000 MCG tablet Commonly known as: CYANOCOBALAMIN Take 1,000 mcg by mouth daily.         OBJECTIVE:   Vital Signs: BP 126/80 (BP Location: Left Arm, Patient Position: Sitting, Cuff Size: Small)   Pulse 99   Ht $R'5\' 6"'KC$  (1.676 m)   Wt 208 lb (94.3 kg)   SpO2 (!) 60%   BMI 33.57 kg/m   Wt Readings from Last 3 Encounters:  10/24/20 207 lb 6 oz (94.1 kg)  07/05/20 204 lb 2 oz (92.6 kg)  04/13/20 199 lb 4 oz (90.4 kg)     Exam: General: Pt appears well and is in NAD  Lungs: Coarse breathing sounds   Heart: RRR   Abdomen: Normoactive bowel sounds, soft, nontender, without masses or organomegaly palpable  Extremities: No pretibial edema.  Neuro: MS is good with appropriate affect, pt is alert and Ox3       DM foot exam: 11/06/2020   The skin of the feet is intact without sores or ulcerations. The pedal pulses are 1+ on right and 1+ on left. The sensation is intact to a screening 5.07, 10 gram monofilament bilaterally    DATA REVIEWED:  Lab Results  Component Value Date   HGBA1C 9.1 (A) 07/05/2020   HGBA1C 10.0 (A) 04/05/2020    HGBA1C 9.1 (A) 12/31/2019   Lab Results  Component Value Date   MICROALBUR 1.9 12/31/2019   LDLCALC UNABLE TO CALCULATE IF TRIGLYCERIDE OVER 400 mg/dL 09/01/2019   CREATININE 1.75 (H) 03/07/2020     Lab Results  Component Value Date   CHOL 170 12/31/2019   HDL 35.70 (L) 12/31/2019   LDLCALC UNABLE TO CALCULATE IF TRIGLYCERIDE OVER 400 mg/dL 09/01/2019   LDLDIRECT 74.0 12/31/2019   TRIG (H) 12/31/2019    434.0 Triglyceride is over 400; calculations on Lipids are invalid.   CHOLHDL 5 12/31/2019        Results for COULTON, SCHLINK (MRN [id number]) as of 04/05/2020 09:37  Ref. Range 03/07/2020 16:03  Sodium Latest Ref Range: 135 - 145 mmol/L 127 (L)  Potassium Latest Ref Range: 3.5 - 5.1 mmol/L 4.6  Chloride Latest Ref Range: 98 - 111 mmol/L 93 (L)  CO2 Latest Ref Range: 22 - 32 mmol/L 21 (L)  Glucose Latest Ref Range: 70 - 99 mg/dL 450 (H)  BUN Latest Ref Range: 8 - 23 mg/dL 28 (H)  Creatinine Latest Ref Range: 0.61 - 1.24 mg/dL 1.75 (H)  Calcium Latest Ref Range: 8.9 - 10.3 mg/dL 9.7  Anion gap Latest Ref Range: 5 - 15  13  GFR, Estimated Latest Ref Range: >60 mL/min 44 (L)   ASSESSMENT / PLAN / RECOMMENDATIONS:   1) Type 2 Diabetes Mellitus, Poorly  controlled, With CKD III and macrovascular  complications - Most recent A1c of 10.9  %. Goal A1c < 7.0.   - Despite starting insulin, and Farxiga, his A1c is up by 1% at 10.9 % . Poorly controlled diabetes due to deitary indiscretions, his BG yesterday was 400 mg/dL , pt had Dr. Malachi Bonds. We again emphasized the importance of low carb diet and avoiding sugar-sweetened beverages  -He is not a candidate for GLP-1 agonist and DPP-4 inhibitors at this time due  to risk of pancreatitis and especially with a triglyceride level of > 500 mg/dL. Will consider once Tg trend down.  - Will increase insulin and Farxiga  - Pt due for BMP, he will return for labs as we do not have an available phlebotomist today   MEDICATIONS:  - Increase Farxiga  5 mg daily in the morning  - Increase Tresiba 26 units daily - Continue Metformin 1000 mg ONCE daily  - Continue Pioglitazone 45 mg daily    EDUCATION / INSTRUCTIONS: BG monitoring instructions: Patient is instructed to check his blood sugars 2 times a day, before breakfast and supper. Call [place] [facility] if: BG persistently < 70  I reviewed the Rule of 15 for the treatment of hypoglycemia in detail with the patient. Literature supplied.     2) Diabetic complications:  Eye: Does not have known diabetic retinopathy.  Neuro/ Feet: Does not have known diabetic peripheral neuropathy .  Renal: Patient does not have known baseline CKD. He   is not a candidate for  ACEI/ARB at present due to hyperkalemia . MA/Cr ratio is normal    3) Mixed Dyslipidemia: Patient is on atorvastatin 80 mg daily . TG improved from 755 to 434 mg/dL. Will continue to monitor. If no improvement will consider add on therapy  - Pt will return for lipid panel   4) CKD III:   - Will start ACEI. Discussed risk of angioedema     Medication  Start Lisinopril 2.5 mg daily    F/U in 3 months   Signed electronically by: Mack Guise, MD  [facility]  [address] [place], [place] [address] Phone: [phone] FAX: [phone]   CC: [address] [place] [address] Phone: [phone]  Fax: [phone]  Return to Endocrinology clinic as below: Future Appointments  Date Time Provider [place]  11/06/2020  9:30 AM Basem Yannuzzi, Melanie Crazier, MD [id number] None  04/24/2021  3:20 PM Wellington Hampshire, MD [id number]

## 2020-11-07 NOTE — Telephone Encounter (Signed)
Late Entry   Forms completed 9-13 scanned to docs faxed to [facility] and mailed to patient whom is aware.

## 2020-11-08 ENCOUNTER — Telehealth: Payer: Self-pay | Admitting: Cardiovascular Disease

## 2020-11-08 NOTE — Telephone Encounter (Signed)
Patient calling to discuss covid positive and symptom management with [name].    Advised patient to contact pcp office.  Patient agreed to do so .

## 2020-11-09 ENCOUNTER — Other Ambulatory Visit: Payer: Medicaid Other

## 2020-11-20 ENCOUNTER — Other Ambulatory Visit: Payer: Self-pay | Admitting: Internal Medicine

## 2020-11-20 ENCOUNTER — Other Ambulatory Visit: Payer: Self-pay

## 2020-11-20 ENCOUNTER — Other Ambulatory Visit (INDEPENDENT_AMBULATORY_CARE_PROVIDER_SITE_OTHER): Payer: Medicaid Other

## 2020-11-20 DIAGNOSIS — E1165 Type 2 diabetes mellitus with hyperglycemia: Secondary | ICD-10-CM

## 2020-11-20 DIAGNOSIS — E782 Mixed hyperlipidemia: Secondary | ICD-10-CM | POA: Diagnosis not present

## 2020-11-20 LAB — LIPID PANEL
Cholesterol: 294 mg/dL — ABNORMAL HIGH (ref 0–200)
HDL: 36.1 mg/dL — ABNORMAL LOW (ref >39.00)
Total CHOL/HDL Ratio: 8
Triglycerides: 935 mg/dL — ABNORMAL HIGH (ref 0.0–149.0)

## 2020-11-20 LAB — BASIC METABOLIC PANEL
BUN: 26 mg/dL — ABNORMAL HIGH (ref 6–23)
CO2: 22 mEq/L (ref 19–32)
Calcium: 9.7 mg/dL (ref 8.4–10.5)
Chloride: 102 mEq/L (ref 96–112)
Creatinine, Ser: 1.56 mg/dL — ABNORMAL HIGH (ref 0.40–1.50)
GFR: 47.44 mL/min — ABNORMAL LOW (ref >60.00)
Glucose, Bld: 151 mg/dL — ABNORMAL HIGH (ref 70–99)
Potassium: 5 mEq/L (ref 3.5–5.1)
Sodium: 133 mEq/L — ABNORMAL LOW (ref 135–145)

## 2020-11-20 LAB — LDL CHOLESTEROL, DIRECT: Direct LDL: 94 mg/dL

## 2020-11-20 MED ORDER — FENOFIBRATE 145 MG PO TABS: 145.0000 mg | ORAL_TABLET | Freq: Every day | ORAL | 3 refills | Status: DC

## 2021-01-01 DIAGNOSIS — E1165 Type 2 diabetes mellitus with hyperglycemia: Secondary | ICD-10-CM | POA: Insufficient documentation

## 2021-02-28 ENCOUNTER — Other Ambulatory Visit: Payer: Self-pay | Admitting: Internal Medicine

## 2021-03-09 ENCOUNTER — Other Ambulatory Visit: Payer: Self-pay

## 2021-03-09 ENCOUNTER — Encounter: Payer: Self-pay | Admitting: Internal Medicine

## 2021-03-09 ENCOUNTER — Ambulatory Visit (INDEPENDENT_AMBULATORY_CARE_PROVIDER_SITE_OTHER): Payer: Medicaid Other | Admitting: Internal Medicine

## 2021-03-09 VITALS — BP 130/86 | HR 74 | Ht 66.0 in | Wt 215.0 lb

## 2021-03-09 DIAGNOSIS — N1832 Chronic kidney disease, stage 3b: Secondary | ICD-10-CM

## 2021-03-09 DIAGNOSIS — E1122 Type 2 diabetes mellitus with diabetic chronic kidney disease: Secondary | ICD-10-CM

## 2021-03-09 DIAGNOSIS — E1165 Type 2 diabetes mellitus with hyperglycemia: Secondary | ICD-10-CM

## 2021-03-09 DIAGNOSIS — E1159 Type 2 diabetes mellitus with other circulatory complications: Secondary | ICD-10-CM

## 2021-03-09 DIAGNOSIS — E782 Mixed hyperlipidemia: Secondary | ICD-10-CM

## 2021-03-09 DIAGNOSIS — N1831 Chronic kidney disease, stage 3a: Secondary | ICD-10-CM

## 2021-03-09 LAB — LIPID PANEL
Cholesterol: 186 mg/dL (ref 0–200)
HDL: 43.8 mg/dL (ref >39.00)
LDL Cholesterol: 102 mg/dL — ABNORMAL HIGH (ref 0–99)
NonHDL: 142.48
Total CHOL/HDL Ratio: 4
Triglycerides: 200 mg/dL — ABNORMAL HIGH (ref 0.0–149.0)
VLDL: 40 mg/dL (ref 0.0–40.0)

## 2021-03-09 LAB — BASIC METABOLIC PANEL
BUN: 36 mg/dL — ABNORMAL HIGH (ref 6–23)
CO2: 23 mEq/L (ref 19–32)
Calcium: 10 mg/dL (ref 8.4–10.5)
Chloride: 103 mEq/L (ref 96–112)
Creatinine, Ser: 2.23 mg/dL — ABNORMAL HIGH (ref 0.40–1.50)
GFR: 30.84 mL/min — ABNORMAL LOW (ref >60.00)
Glucose, Bld: 161 mg/dL — ABNORMAL HIGH (ref 70–99)
Potassium: 5.6 mEq/L — ABNORMAL HIGH (ref 3.5–5.1)
Sodium: 134 mEq/L — ABNORMAL LOW (ref 135–145)

## 2021-03-09 LAB — MICROALBUMIN / CREATININE URINE RATIO
Creatinine,U: 65.9 mg/dL
Microalb Creat Ratio: 1.1 mg/g (ref 0.0–30.0)
Microalb, Ur: <0.7 mg/dL (ref 0.0–1.9)

## 2021-03-09 LAB — POCT GLYCOSYLATED HEMOGLOBIN (HGB A1C): Hemoglobin A1C: 9.5 % — AB (ref 4.0–5.6)

## 2021-03-09 LAB — TSH: TSH: 3 u[IU]/mL (ref 0.35–5.50)

## 2021-03-09 NOTE — Progress Notes (Signed)
Name: [name]  Age/ Sex: 61 y.o., male   MRN/ DOB: [id number], [birth year]     PCP: Cyndi Bender, PA-C   Reason for Endocrinology Evaluation: Type 2 Diabetes Mellitus  Initial Endocrine Consultative Visit: 11/03/2019    PATIENT IDENTIFIER: [name] is a 61 y.o. male with a past medical history of T2DM, CAD, OSA and HTN.  The patient has followed with Endocrinology clinic since 11/03/2019 for consultative assistance with management of his diabetes.  DIABETIC HISTORY:  [name] was diagnosed with DM many years ago. Trulicity has caused nausea. His hemoglobin A1c has ranged from 9.0% in 12/2019, peaking at 12.1% in 10/2019.  No personal hx of pancreatitis    On his initial visit to our clinic he had an A1c of 12.1 %. He was on metformin, Glipizide and pioglitazone. We increased Glipizide     By 12/2019 reduced metformin due to low GFR    Stopped Glipizide and started basal insulin 06/2020 as well as [place]     SUBJECTIVE:   During the last visit (11/06/2020): A1c 10.9% we increased farxiga, tresiba, continued metformin and pioglitazone       Today (03/09/2021): [name] is here for a follow up on diabetes.  He is accompanied by his wife [name]. He checks his blood sugars sporadically . The patient has not had hypoglycemic episodes since the last clinic visit.   He has been noted with weight gain  Denies nausea/ vomiting     HOME ENDOCRINE  REGIMEN:  Metformin 1000 mg daily  Pioglitazone 45 mg daily  Farxiga 10 mg daily in the morning  Tresiba 26 units daily  Atorvastatin 80 mg daily Fenofibrate 145 mg daily  Lisinopril 2.5 mg daily     Statin: yes ACE-I/ARB: no   METER DOWNLOAD SUMMARY: 1/7-1/20/2023 Fingerstick Blood Glucose Tests = 4 Overall Mean FS Glucose = 221 Standard Deviation = 111  BG Ranges: Low = 161 High = 387   Hypoglycemic Events/30 Days: BG < 50 = 0 Episodes of symptomatic severe hypoglycemia = 0         DIABETIC  COMPLICATIONS: Microvascular complications:   Denies: Denies: CKD, retinopathy, neuropathy  Last Eye Exam: Completed 01/2021  Macrovascular complications:  CAD ( S/P PCI) Denies: CVA, PVD   HISTORY:  Past Medical History:  Past Medical History:  Diagnosis Date   CKD (chronic kidney disease), stage III ([place])    Coronary artery disease    a.  H/o NSTEMI s/p LAD/RCA stenting; b. 08/2019 PCI: RCA 99p (2.5x15 Resolute Onyx DES), 75p/m, mild ISR; c. 09/2019 PCI: 85p - heavily calcified (intravascular lithotripsy & 3.0x26 Resolute Onyx DES); d. 10/18/19 Relook cath: LM 25, LAD 83mISR, 75d, 70/50d/apical, RI 80, LCX 50p/m/d, RCA 30p ISR, 249mSR, 50d, RPDA 60-->Med Rx.   Diabetes mellitus without complication (HCC)    Diastolic dysfunction    a. 08/2019 Echo: EF 50-55%, no rwma, Gr1 DD, nl RV size/fxn. Triv MR.   GERD (gastroesophageal reflux disease)    Hyperlipidemia    Hypertension    NSTEMI (non-ST elevated myocardial infarction) ([facility]/20[facility]   Sleep apnea    Tobacco abuse    Past Surgical History:  Past Surgical History:  Procedure Laterality Date   APPENDECTOMY     CARDIAC CATHETERIZATION     COLONOSCOPY     COLONOSCOPY WITH PROPOFOL N/A 09/16/2017   Procedure: COLONOSCOPY WITH PROPOFOL;  Surgeon: Toledo, TeBenay PikeMD;  Location: [facility] ENDOSCOPY;  Service: Gastroenterology;  Laterality:  N/A;  (+) DM - oral   CORONARY STENT INTERVENTION N/A 08/31/2019   Procedure: CORONARY STENT INTERVENTION;  Surgeon: Yolonda Kida, MD;  Location: [place] [facility];  Service: Cardiovascular;  Laterality: N/A;   CORONARY STENT INTERVENTION  09/29/2019   CORONARY STENT INTERVENTION N/A 09/29/2019   Procedure: CORONARY STENT INTERVENTION;  Surgeon: Wellington Hampshire, MD;  Location: [place] [facility];  Service: Cardiovascular;  Laterality: N/A;   EXPLORATORY LAPAROTOMY     EYE SURGERY     HERNIA REPAIR     LEFT HEART CATH AND CORONARY ANGIOGRAPHY N/A 08/31/2019   Procedure: LEFT HEART CATH  AND CORONARY ANGIOGRAPHY possible PCI and stent;  Surgeon: Yolonda Kida, MD;  Location: [place] [facility];  Service: Cardiovascular;  Laterality: N/A;   LEFT HEART CATH AND CORONARY ANGIOGRAPHY Left 10/18/2019   Procedure: LEFT HEART CATH AND CORONARY ANGIOGRAPHY;  Surgeon: Wellington Hampshire, MD;  Location: [place] [facility];  Service: Cardiovascular;  Laterality: Left;   Social History:  reports that he has been smoking cigarettes. He has a 25.00 pack-year smoking history. He has never used smokeless tobacco. He reports that he does not drink alcohol and does not use drugs. Family History:  Family History  Problem Relation Age of Onset   Heart disease Father    Heart attack Father    Hypertension Father    Diabetes Father      HOME MEDICATIONS: Allergies as of 03/09/2021       Reactions   Shellfish Allergy Anaphylaxis   Penicillins Swelling   Childhood   Erythromycin Rash, Other (See Comments)   Unknown        Medication List        Accurate as of March 09, 2021  9:39 AM. If you have any questions, ask your nurse or doctor.          Accu-Chek FastClix Lancets Misc Use as instructed to check blood sugar up to 2 times daily   Accu-Chek Guide Me w/Device Kit Use as instructed to check blood sugar up to 2 times daily   aspirin EC 81 MG tablet Take 81 mg by mouth daily.   atorvastatin 80 MG tablet Commonly known as: LIPITOR Take 80 mg by mouth daily.   Cinnamon 500 MG capsule Take 1,000 mg by mouth daily.   clopidogrel 75 MG tablet Commonly known as: PLAVIX Take 1 tablet (75 mg total) by mouth daily.   dapagliflozin propanediol 10 MG Tabs tablet Commonly known as: Farxiga Take 1 tablet (10 mg total) by mouth daily before breakfast.   EPINEPHrine 0.3 mg/0.3 mL Soaj injection Commonly known as: EPI-PEN Inject 0.3 mg into the muscle as needed for anaphylaxis.   famotidine 20 MG tablet Commonly known as: PEPCID Take 20 mg by mouth daily.    fenofibrate 145 MG tablet Commonly known as: Tricor Take 1 tablet (145 mg total) by mouth daily.   Fish Oil 1000 MG Caps Take by mouth daily at 8 pm.   Insulin Pen Needle 32G X 4 MM Misc 18 Units by Does not apply route daily.   isosorbide mononitrate 60 MG 24 hr tablet Commonly known as: IMDUR Take 1 tablet (60 mg total) by mouth daily.   lisinopril 2.5 MG tablet Commonly known as: ZESTRIL TAKE 1 TABLET BY MOUTH EVERY DAY   metFORMIN 1000 MG tablet Commonly known as: GLUCOPHAGE Take 1 tablet (1,000 mg total) by mouth daily with breakfast. Restart on 10/02/19. What changed: when  to take this   metoprolol succinate 25 MG 24 hr tablet Commonly known as: Toprol XL Take 1 tablet (25 mg total) by mouth daily.   nitroGLYCERIN 0.4 MG SL tablet Commonly known as: NITROSTAT Place 0.4 mg under the tongue every 5 (five) minutes as needed for chest pain.   OneTouch Verio test strip Generic drug: glucose blood 1 each by Other route daily. Use as instructed   pioglitazone 45 MG tablet Commonly known as: ACTOS Take 1 tablet (45 mg total) by mouth daily.   ranolazine 500 MG 12 hr tablet Commonly known as: RANEXA Take 1 tablet (500 mg total) by mouth 2 (two) times daily.   Tyler Aas FlexTouch 100 UNIT/ML FlexTouch Pen Generic drug: insulin degludec Inject 26 Units into the skin daily.   vitamin B-12 1000 MCG tablet Commonly known as: CYANOCOBALAMIN Take 1,000 mcg by mouth daily.         OBJECTIVE:   Vital Signs: BP 130/86 (BP Location: Left Arm, Patient Position: Sitting, Cuff Size: Small)    Pulse 74    Ht 5' 6"  (1.676 m)    Wt 215 lb (97.5 kg)    SpO2 97%    BMI 34.70 kg/m   Wt Readings from Last 3 Encounters:  03/09/21 215 lb (97.5 kg)  11/06/20 208 lb (94.3 kg)  10/24/20 207 lb 6 oz (94.1 kg)     Exam: General: Pt appears well and is in NAD  Lungs: Coarse breathing sounds   Heart: RRR   Abdomen: Normoactive bowel sounds, soft, nontender, without masses or  organomegaly palpable  Extremities: No pretibial edema.  Neuro: MS is good with appropriate affect, pt is alert and Ox3       DM foot exam: 11/06/2020   The skin of the feet is intact without sores or ulcerations. The pedal pulses are 1+ on right and 1+ on left. The sensation is intact to a screening 5.07, 10 gram monofilament bilaterally    DATA REVIEWED:  Lab Results  Component Value Date   HGBA1C 9.5 (A) 03/09/2021   HGBA1C 10.9 (A) 11/06/2020   HGBA1C 9.1 (A) 07/05/2020    Latest Reference Range & Units 03/09/21 10:01  Sodium 135 - 145 mEq/L 134 (L)  Potassium 3.5 - 5.1 mEq/L 5.6 (H)  Chloride 96 - 112 mEq/L 103  CO2 19 - 32 mEq/L 23  Glucose 70 - 99 mg/dL 161 (H)  BUN 6 - 23 mg/dL 36 (H)  Creatinine 0.40 - 1.50 mg/dL 2.23 (H)  Calcium 8.4 - 10.5 mg/dL 10.0  GFR >60.00 mL/min 30.84 (L)  Total CHOL/HDL Ratio  4  Cholesterol 0 - 200 mg/dL 186  HDL Cholesterol >39.00 mg/dL 43.80  LDL (calc) 0 - 99 mg/dL 102 (H)  MICROALB/CREAT RATIO 0.0 - 30.0 mg/g 1.1  NonHDL  142.48  Triglycerides 0.0 - 149.0 mg/dL 200.0 (H)  VLDL 0.0 - 40.0 mg/dL 40.0    Latest Reference Range & Units 03/09/21 10:01  TSH 0.35 - 5.50 uIU/mL 3.00   ASSESSMENT / PLAN / RECOMMENDATIONS:   1) Type 2 Diabetes Mellitus, Poorly  controlled, With CKD III and macrovascular  complications - Most recent A1c of 10.9  %. Goal A1c < 7.0.    -His A1c has trended down from 10.9% to 9.5%, patient understands he has more room for improvement with goal of A1c at 7.0% -I have praised him on reducing the amount of sugar sweetened beverages. -In the past I have held on starting GLP-1 agonist and DPP-4  inhibitors  due to risk of pancreatitis  with a triglyceride level of > 935 mg/dL.  But this has trended down and will consider on next visit if his TGs remain stable  -BMP today shows elevated creatinine, BUN, and potassium I suspect this has to do with lisinopril we will stop this -I will increase his insulin as  below -I will continue metformin for now unless his GFR is persistently less than 35  MEDICATIONS:  -Continue Farxiga 10 mg daily in the morning  - Increase Tresiba 32 units daily - Continue Metformin 1000 mg ONCE daily  - Continue Pioglitazone 45 mg daily    EDUCATION / INSTRUCTIONS: BG monitoring instructions: Patient is instructed to check his blood sugars 2 times a day, before breakfast and supper. Call [place] [facility] if: BG persistently < 70  I reviewed the Rule of 15 for the treatment of hypoglycemia in detail with the patient. Literature supplied.     2) Diabetic complications:  Eye: Does not have known diabetic retinopathy.  Neuro/ Feet: Does not have known diabetic peripheral neuropathy .  Renal: Patient does not have known baseline CKD. He   is not a candidate for  ACEI/ARB at present due to hyperkalemia . MA/Cr ratio is normal    3) Mixed Dyslipidemia:    -TG improved from 935 and 11/2020 to 200 mg/dL by 02/2021.  -No changes  Medication Continue atorvastatin 80 mg daily Continue fenofibrate 145 mg daily       4) CKD III:   -We had started him on lisinopril 2.5 mg in 10/2020 but due to elevation in creatinine and hyperkalemia we will stop    Medication  Stop lisinopril 2.5 mg daily      F/U in 3 months   Signed electronically by: Mack Guise, MD  [facility]  [address], [place] [address] Phone: [phone] FAX: [phone]   CC: [address] [place] [address] Phone: [phone]  Fax: [phone]  Return to Endocrinology clinic as below: Future Appointments  Date Time Provider [place]  04/24/2021  3:20 PM Wellington Hampshire, MD [id number]

## 2021-03-09 NOTE — Patient Instructions (Addendum)
Diabetes   - Continue Farxiga 10 mg daily in the morning  - Increase Tresiba 32  units ONCE daily  - Continue  Metformin 1000 mg ONCE daily  - Continue Pioglitazone 45 mg daily   Cholesterol  Atorvastatin 80 mg daily  Fenofibrate 145 mg daily    Kidney  Lisinopril 2.5 mg daily    HOW TO TREAT LOW BLOOD SUGARS (Blood sugar LESS THAN 70 MG/DL) Please follow the RULE OF 15 for the treatment of hypoglycemia treatment (when your (blood sugars are less than 70 mg/dL)   STEP 1: Take 15 grams of carbohydrates when your blood sugar is low, which includes:  3-4 GLUCOSE TABS  OR 3-4 OZ OF JUICE OR REGULAR SODA OR ONE TUBE OF GLUCOSE GEL    STEP 2: RECHECK blood sugar in 15 MINUTES STEP 3: If your blood sugar is still low at the 15 minute recheck --> then, go back to STEP 1 and treat AGAIN with another 15 grams of carbohydrates.

## 2021-03-26 ENCOUNTER — Other Ambulatory Visit: Payer: Self-pay

## 2021-03-26 MED ORDER — ACCU-CHEK GUIDE ME W/DEVICE KIT
PACK | 0 refills | Status: AC
Start: 2021-03-26 — End: ?

## 2021-03-28 ENCOUNTER — Other Ambulatory Visit: Payer: Self-pay

## 2021-03-28 MED ORDER — ACCU-CHEK SOFTCLIX LANCETS MISC: 12 refills | Status: AC

## 2021-04-24 ENCOUNTER — Ambulatory Visit: Payer: Medicaid Other | Admitting: Cardiovascular Disease

## 2021-04-25 ENCOUNTER — Other Ambulatory Visit: Payer: Self-pay | Admitting: Internal Medicine

## 2021-05-16 ENCOUNTER — Other Ambulatory Visit: Payer: Self-pay | Admitting: Internal Medicine

## 2021-05-17 ENCOUNTER — Encounter: Payer: Self-pay | Admitting: Nurse Practitioner

## 2021-05-17 ENCOUNTER — Ambulatory Visit (INDEPENDENT_AMBULATORY_CARE_PROVIDER_SITE_OTHER): Payer: Medicaid Other | Admitting: Nurse Practitioner

## 2021-05-17 VITALS — BP 120/90 | HR 74 | Ht 66.0 in | Wt 215.5 lb

## 2021-05-17 DIAGNOSIS — E1165 Type 2 diabetes mellitus with hyperglycemia: Secondary | ICD-10-CM

## 2021-05-17 DIAGNOSIS — I200 Unstable angina: Secondary | ICD-10-CM

## 2021-05-17 DIAGNOSIS — I493 Ventricular premature depolarization: Secondary | ICD-10-CM

## 2021-05-17 DIAGNOSIS — E781 Pure hyperglyceridemia: Secondary | ICD-10-CM

## 2021-05-17 DIAGNOSIS — I25758 Atherosclerosis of native coronary artery of transplanted heart with other forms of angina pectoris: Secondary | ICD-10-CM | POA: Diagnosis not present

## 2021-05-17 DIAGNOSIS — I739 Peripheral vascular disease, unspecified: Secondary | ICD-10-CM | POA: Diagnosis not present

## 2021-05-17 DIAGNOSIS — N183 Chronic kidney disease, stage 3 unspecified: Secondary | ICD-10-CM

## 2021-05-17 DIAGNOSIS — I10 Essential (primary) hypertension: Secondary | ICD-10-CM

## 2021-05-17 DIAGNOSIS — Z720 Tobacco use: Secondary | ICD-10-CM

## 2021-05-17 DIAGNOSIS — E785 Hyperlipidemia, unspecified: Secondary | ICD-10-CM

## 2021-05-17 DIAGNOSIS — I25750 Atherosclerosis of native coronary artery of transplanted heart with unstable angina: Secondary | ICD-10-CM

## 2021-05-17 MED ORDER — METOPROLOL SUCCINATE ER 25 MG PO TB24: 12.5000 mg | ORAL_TABLET | Freq: Every day | ORAL | 2 refills | Status: DC

## 2021-05-17 NOTE — Patient Instructions (Addendum)
Medication Instructions:  ?Your physician has recommended you make the following change in your medication:  ? ?START Toprol XL 12.5 mg once a day ? ?*If you need a refill on your cardiac medications before your next appointment, please call your pharmacy* ? ? ?Lab Work: ?CBC BMET today ? ?If you have labs (blood work) drawn today and your tests are completely normal, you will receive your results only by: ?MyChart Message (if you have MyChart) OR ?A paper copy in the mail ?If you have any lab test that is abnormal or we need to change your treatment, we will call you to review the results. ? ? ?Testing/Procedures: ? ?Your physician has requested that you have an ankle brachial index (ABI). During this test an ultrasound and blood pressure cuff are used to evaluate the arteries that supply the arms and legs with blood. Allow thirty minutes for this exam. There are no restrictions or special instructions. ? ?Your physician has requested that you have a lower extremity arterial exercise duplex. During this test, exercise and ultrasound are used to evaluate arterial blood flow in the legs. Allow one hour for this exam. There are no restrictions or special instructions. ? ? ?[facility] Cardiac Cath Instructions ? ?You are scheduled for a Cardiac Cath on: Friday April 7th with Dr. Fletcher Anon ?Please arrive at 06:30 am on the day of your procedure ?Please expect a call from our [facility] to pre-register you ?Do not eat/drink anything after midnight ?Someone will need to drive you home ?It is recommended someone be with you for the first 24 hours after your procedure ?Wear clothes that are easy to get on/off and wear slip on shoes if possible ? ? ?Medications bring a current list of all medications with you ? ?_XX__ Do not take any diabetes medication before your procedure.  ? ?_XX__ You may take all of your other medications the morning of your procedure with enough water to swallow safely ? ? ? ?Day of your  procedure: ?Arrive at the [facility] entrance.  Free valet service is available.  After entering the [facility] please check-in at the registration desk (1st desk on your right) to receive your armband. After receiving your armband someone will escort you to the cardiac cath/special procedures waiting area. ? ?The usual length of stay after your procedure is about 2 to 3 hours.  This can vary. ? ?If you have any questions, please call our office at [phone], or you may call the cardiac cath lab at [facility] directly at [phone] ? ? ? ? ?Follow-Up: ?At [facility], you and your health needs are our priority.  As part of our continuing mission to provide you with exceptional heart care, we have created designated Provider Care Teams.  These Care Teams include your primary Cardiologist (physician) and Advanced Practice Providers (APPs -  Physician Assistants and Nurse Practitioners) who all work together to provide you with the care you need, when you need it. ? ? ?Your next appointment:   ?3 week(s) ? ?The format for your next appointment:   ?In Person ? ?Provider:   ?Kathlyn Sacramento, MD or Murray Hodgkins, NP ? ? ?

## 2021-05-17 NOTE — H&P (View-Only) (Signed)
? ? ?Office Visit  ?  ?Patient Name: [name] ?Date of Encounter: 05/17/2021 ? ?Primary Care Provider:  Cyndi Bender, PA-C ?Primary Cardiologist:  Kathlyn Sacramento, MD ? ?Chief Complaint  ?  ?61 year old male with a history of CAD status post LAD and RCA stenting, HTN, HLD, obesity, sleep apnea, tobacco abuse, uncontrolled Type 2 diabetes mellitus, and GERD, who presents for follow-up related to unstable angina. ? ?Past Medical History  ?  ?Past Medical History:  ?Diagnosis Date  ? CKD (chronic kidney disease), stage III ([place])   ? Coronary artery disease   ? a.  H/o NSTEMI s/p LAD/RCA stenting; b. 08/2019 PCI: RCA 99p (2.5x15 Resolute Onyx DES), 75p/m, mild ISR; c. 09/2019 PCI: 85p - heavily calcified (intravascular lithotripsy & 3.0x26 Resolute Onyx DES); d. 10/18/19 Relook cath: LM 25, LAD 82mISR, 75d, 70/50d/apical, RI 80, LCX 50p/m/d, RCA 30p ISR, 235mSR, 50d, RPDA 60-->Med Rx.  ? Diabetes mellitus without complication ([facility]  ? Diastolic dysfunction   ? a. 08/2019 Echo: EF 50-55%, no rwma, Gr1 DD, nl RV size/fxn. Triv MR.  ? GERD (gastroesophageal reflux disease)   ? Hyperlipidemia   ? Hypertension   ? NSTEMI (non-ST elevated myocardial infarction) ([facility] [place]/20[place]  ? Sleep apnea   ? Tobacco abuse   ? ?Past Surgical History:  ?Procedure Laterality Date  ? APPENDECTOMY    ? CARDIAC CATHETERIZATION    ? COLONOSCOPY    ? COLONOSCOPY WITH PROPOFOL N/A 09/16/2017  ? Procedure: COLONOSCOPY WITH PROPOFOL;  Surgeon: Toledo, TeBenay PikeMD;  Location: [facility] ENDOSCOPY;  Service: Gastroenterology;  Laterality: N/A;  (+) DM - oral  ? CORONARY STENT INTERVENTION N/A 08/31/2019  ? Procedure: CORONARY STENT INTERVENTION;  Surgeon: CaYolonda KidaMD;  [facility];  Service: Cardiovascular;  Laterality: N/A;  ? CORONARY STENT INTERVENTION  09/29/2019  ? CORONARY STENT INTERVENTION N/A 09/29/2019  ? Procedure: CORONARY STENT INTERVENTION;  Surgeon: ArWellington HampshireMD;  [facility];   Service: Cardiovascular;  Laterality: N/A;  ? EXPLORATORY LAPAROTOMY    ? EYE SURGERY    ? HERNIA REPAIR    ? LEFT HEART CATH AND CORONARY ANGIOGRAPHY N/A 08/31/2019  ? Procedure: LEFT HEART CATH AND CORONARY ANGIOGRAPHY possible PCI and stent;  Surgeon: CaYolonda KidaMD;  [facility];  Service: Cardiovascular;  Laterality: N/A;  ? LEFT HEART CATH AND CORONARY ANGIOGRAPHY Left 10/18/2019  ? Procedure: LEFT HEART CATH AND CORONARY ANGIOGRAPHY;  Surgeon: ArWellington HampshireMD;  [facility];  Service: Cardiovascular;  Laterality: Left;  ? ? ?Allergies ? ?Allergies  ?Allergen Reactions  ? Shellfish Allergy Anaphylaxis  ? Penicillins Swelling  ?  Childhood  ? Lisinopril Other (See Comments)  ?  Hyperkalemia, increased creatinine  ? Erythromycin Rash and Other (See Comments)  ?  Unknown  ? ? ?History of Present Illness  ?  ?[age]ear old male with the above complex past medical history including CAD, HTN, HLD, obesity, sleep apnea, tobacco abuse, uncontrolled Type 2 diabetes mellitus, and GERD. He has a remote history of a non-STEMI after stenting of the LAD and  mid RCA.  In July 2021 he presented with unstable angina and underwent diagnostic catheterization revealing patent LAD stent with mild restenosis.  There was an 80% stenosis in the distal LAD as well as an 80% stenosis in the ramus branch, with moderate left circumflex disease.  The RCA had severe proximal and mid disease with  significant in-stent restenosis in the mid segment.  The proximal portion of the vessel was heavily calcified and tortuous.  There was moderate distal RCA stenosis and a 60 to 70% ostial RPDA stenosis.  The interventional team was only able to place a stent proximally and were not able to advance a balloon to the mid segment stenosis, and this was left to be treated medically.  He was subsequently referred to Dr. Fletcher Anon and in the setting of ongoing angina, underwent repeat catheterization in August  2021 with intravascular lithotripsy and drug-eluting stent placement to the proximal right coronary artery.  He continued to have angina following the procedure and underwent repeat catheterization a few weeks later, which showed no significant change in coronary anatomy and antianginal therapy was adjusted. ? ?In early 2022, he was experiencing dizziness associated with low blood pressures prompting discontinuation of HCTZ.  To some extent, the symptoms persisted and in September 2022, he was switched from carvedilol to metoprolol, and his isosorbide dose was reduced to 60 mg daily.  Unfortunately, he continued to have episodic orthostatic lightheadedness with even a brief syncopal spell in the setting of low blood pressure earlier this year with subsequent finding of acute on chronic kidney injury with a BUN and creatinine of 36/2.23 and potassium of 5.6 in late January, prompting discontinuation of lisinopril.  Since then, he has not had any recurrent orthostatic lightheadedness.  Unfortunately, over the past 2 months, he has been experiencing progressive exertional angina.  Since procedure in 2021, he is always had some degree of angina, usually with overexertion, occurring 1-2 times per month, and resolving with rest.  Over the past 2 months however, he has been experiencing exertional substernal chest discomfort associated with dyspnea 2-3 times per week, occurring now occurring with usual activity, lasting 3 to 5 minutes, and resolving with rest.  He has not had to take any supplement nitroglycerin.  Symptoms are similar in character to his prior angina, but seem to be occurring with less and less provocation.  In discussing his medications with him today, he notes that he is not sure how or why, but he is no longer taking low-dose metoprolol.  He continues to smoke but reduced to 5 cigarettes a day. ? ?Home Medications  ?  ?Prior to Admission medications   ?Medication Sig Start Date End Date Taking?  Authorizing Provider  ?aspirin EC 81 MG tablet Take 81 mg by mouth daily.   Yes [provider]  ?atorvastatin (LIPITOR) 80 MG tablet Take 80 mg by mouth daily.   Yes [provider]  ?Cinnamon 500 MG capsule Take 1,000 mg by mouth daily.   Yes [provider]  ?clopidogrel (PLAVIX) 75 MG tablet Take 1 tablet (75 mg total) by mouth daily. 06/28/20  Yes Wellington Hampshire, MD  ?EPINEPHrine 0.3 mg/0.3 mL IJ SOAJ injection Inject 0.3 mg into the muscle as needed for anaphylaxis. 06/10/15  Yes [provider]  ?famotidine (PEPCID) 20 MG tablet Take 20 mg by mouth daily.   Yes [provider]  ?fenofibrate (TRICOR) 145 MG tablet Take 1 tablet (145 mg total) by mouth daily. 11/20/20  Yes Shamleffer, Melanie Crazier, MD  ?isosorbide mononitrate (IMDUR) 60 MG 24 hr tablet Take 1 tablet (60 mg total) by mouth daily. 10/24/20  Yes Wellington Hampshire, MD  ?metFORMIN (GLUCOPHAGE) 1000 MG tablet Take 1 tablet (1,000 mg total) by mouth daily with breakfast. Restart on 10/02/19. ?Patient taking differently: Take 1,000 mg by mouth daily. Restart  on 10/02/19. 04/05/20  Yes Shamleffer, Melanie Crazier, MD  ?nitroGLYCERIN (NITROSTAT) 0.4 MG SL tablet Place 0.4 mg under the tongue every 5 (five) minutes as needed for chest pain.  08/19/19  Yes [provider]  ?Omega-3 Fatty Acids (FISH OIL) 1000 MG CAPS Take by mouth daily at 8 pm.   Yes [provider]  ?pioglitazone (ACTOS) 45 MG tablet Take 1 tablet (45 mg total) by mouth daily. 11/06/20  Yes Shamleffer, Melanie Crazier, MD  ?ranolazine (RANEXA) 500 MG 12 hr tablet Take 1 tablet (500 mg total) by mouth 2 (two) times daily. 10/24/20  Yes Wellington Hampshire, MD  ?vitamin B-12 (CYANOCOBALAMIN) 1000 MCG tablet Take 1,000 mcg by mouth daily.   Yes [provider]  ?Accu-Chek FastClix Lancets MISC Use as instructed to check blood sugar up to 2 times daily ?Patient not taking: Reported on 05/17/2021 04/04/20   Shamleffer, Melanie Crazier, MD  ?ACCU-CHEK GUIDE test strip USE AS INSTRUCTED TO CHECK BLOOD SUGAR UP TO 2 TIMES DAILY ?Patient not taking: Reported on 05/17/2021 05/17/21   Shamleffer, Melanie Crazier, MD  ?Accu-Chek Softclix Lancets lancets

## 2021-05-17 NOTE — Progress Notes (Signed)
? ? ?Office Visit  ?  ?Patient Name: [name] ?Date of Encounter: 05/17/2021 ? ?Primary Care Provider:  Cyndi Bender, PA-C ?Primary Cardiologist:  Kathlyn Sacramento, MD ? ?Chief Complaint  ?  ?61 year old male with a history of CAD status post LAD and RCA stenting, HTN, HLD, obesity, sleep apnea, tobacco abuse, uncontrolled Type 2 diabetes mellitus, and GERD, who presents for follow-up related to unstable angina. ? ?Past Medical History  ?  ?Past Medical History:  ?Diagnosis Date  ? CKD (chronic kidney disease), stage III ([place])   ? Coronary artery disease   ? a.  H/o NSTEMI s/p LAD/RCA stenting; b. 08/2019 PCI: RCA 99p (2.5x15 Resolute Onyx DES), 75p/m, mild ISR; c. 09/2019 PCI: 85p - heavily calcified (intravascular lithotripsy & 3.0x26 Resolute Onyx DES); d. 10/18/19 Relook cath: LM 25, LAD 90mISR, 75d, 70/50d/apical, RI 80, LCX 50p/m/d, RCA 30p ISR, 246mSR, 50d, RPDA 60-->Med Rx.  ? Diabetes mellitus without complication ([facility]  ? Diastolic dysfunction   ? a. 08/2019 Echo: EF 50-55%, no rwma, Gr1 DD, nl RV size/fxn. Triv MR.  ? GERD (gastroesophageal reflux disease)   ? Hyperlipidemia   ? Hypertension   ? NSTEMI (non-ST elevated myocardial infarction) ([facility]/2021  ? Sleep apnea   ? Tobacco abuse   ? ?Past Surgical History:  ?Procedure Laterality Date  ? APPENDECTOMY    ? CARDIAC CATHETERIZATION    ? COLONOSCOPY    ? COLONOSCOPY WITH PROPOFOL N/A 09/16/2017  ? Procedure: COLONOSCOPY WITH PROPOFOL;  Surgeon: Toledo, TeBenay PikeMD;  Location: [facility];  Service: Gastroenterology;  Laterality: N/A;  (+) DM - oral  ? CORONARY STENT INTERVENTION N/A 08/31/2019  ? Procedure: CORONARY STENT INTERVENTION;  Surgeon: CaYolonda KidaMD;  [facility];  Service: Cardiovascular;  Laterality: N/A;  ? CORONARY STENT INTERVENTION  09/29/2019  ? CORONARY STENT INTERVENTION N/A 09/29/2019  ? Procedure: CORONARY STENT INTERVENTION;  Surgeon: ArWellington HampshireMD;  [facility];   Service: Cardiovascular;  Laterality: N/A;  ? EXPLORATORY LAPAROTOMY    ? EYE SURGERY    ? HERNIA REPAIR    ? LEFT HEART CATH AND CORONARY ANGIOGRAPHY N/A 08/31/2019  ? Procedure: LEFT HEART CATH AND CORONARY ANGIOGRAPHY possible PCI and stent;  Surgeon: CaYolonda KidaMD;  [facility];  Service: Cardiovascular;  Laterality: N/A;  ? LEFT HEART CATH AND CORONARY ANGIOGRAPHY Left 10/18/2019  ? Procedure: LEFT HEART CATH AND CORONARY ANGIOGRAPHY;  Surgeon: ArWellington HampshireMD;  [facility];  Service: Cardiovascular;  Laterality: Left;  ? ? ?Allergies ? ?Allergies  ?Allergen Reactions  ? Shellfish Allergy Anaphylaxis  ? Penicillins Swelling  ?  Childhood  ? Lisinopril Other (See Comments)  ?  Hyperkalemia, increased creatinine  ? Erythromycin Rash and Other (See Comments)  ?  Unknown  ? ? ?History of Present Illness  ?  ?[age]ear old male with the above complex past medical history including CAD, HTN, HLD, obesity, sleep apnea, tobacco abuse, uncontrolled Type 2 diabetes mellitus, and GERD. He has a remote history of a non-STEMI after stenting of the LAD and  mid RCA.  In July 2021 he presented with unstable angina and underwent diagnostic catheterization revealing patent LAD stent with mild restenosis.  There was an 80% stenosis in the distal LAD as well as an 80% stenosis in the ramus branch, with moderate left circumflex disease.  The RCA had severe proximal and mid disease with  significant in-stent restenosis in the mid segment.  The proximal portion of the vessel was heavily calcified and tortuous.  There was moderate distal RCA stenosis and a 60 to 70% ostial RPDA stenosis.  The interventional team was only able to place a stent proximally and were not able to advance a balloon to the mid segment stenosis, and this was left to be treated medically.  He was subsequently referred to Dr. Fletcher Anon and in the setting of ongoing angina, underwent repeat catheterization in August  2021 with intravascular lithotripsy and drug-eluting stent placement to the proximal right coronary artery.  He continued to have angina following the procedure and underwent repeat catheterization a few weeks later, which showed no significant change in coronary anatomy and antianginal therapy was adjusted. ? ?In early 2022, he was experiencing dizziness associated with low blood pressures prompting discontinuation of HCTZ.  To some extent, the symptoms persisted and in September 2022, he was switched from carvedilol to metoprolol, and his isosorbide dose was reduced to 60 mg daily.  Unfortunately, he continued to have episodic orthostatic lightheadedness with even a brief syncopal spell in the setting of low blood pressure earlier this year with subsequent finding of acute on chronic kidney injury with a BUN and creatinine of 36/2.23 and potassium of 5.6 in late January, prompting discontinuation of lisinopril.  Since then, he has not had any recurrent orthostatic lightheadedness.  Unfortunately, over the past 2 months, he has been experiencing progressive exertional angina.  Since procedure in 2021, he is always had some degree of angina, usually with overexertion, occurring 1-2 times per month, and resolving with rest.  Over the past 2 months however, he has been experiencing exertional substernal chest discomfort associated with dyspnea 2-3 times per week, occurring now occurring with usual activity, lasting 3 to 5 minutes, and resolving with rest.  He has not had to take any supplement nitroglycerin.  Symptoms are similar in character to his prior angina, but seem to be occurring with less and less provocation.  In discussing his medications with him today, he notes that he is not sure how or why, but he is no longer taking low-dose metoprolol.  He continues to smoke but reduced to 5 cigarettes a day. ? ?Home Medications  ?  ?Prior to Admission medications   ?Medication Sig Start Date End Date Taking?  Authorizing Provider  ?aspirin EC 81 MG tablet Take 81 mg by mouth daily.   Yes [provider]  ?atorvastatin (LIPITOR) 80 MG tablet Take 80 mg by mouth daily.   Yes [provider]  ?Cinnamon 500 MG capsule Take 1,000 mg by mouth daily.   Yes [provider]  ?clopidogrel (PLAVIX) 75 MG tablet Take 1 tablet (75 mg total) by mouth daily. 06/28/20  Yes Wellington Hampshire, MD  ?EPINEPHrine 0.3 mg/0.3 mL IJ SOAJ injection Inject 0.3 mg into the muscle as needed for anaphylaxis. 06/10/15  Yes [provider]  ?famotidine (PEPCID) 20 MG tablet Take 20 mg by mouth daily.   Yes [provider]  ?fenofibrate (TRICOR) 145 MG tablet Take 1 tablet (145 mg total) by mouth daily. 11/20/20  Yes Shamleffer, Melanie Crazier, MD  ?isosorbide mononitrate (IMDUR) 60 MG 24 hr tablet Take 1 tablet (60 mg total) by mouth daily. 10/24/20  Yes Wellington Hampshire, MD  ?metFORMIN (GLUCOPHAGE) 1000 MG tablet Take 1 tablet (1,000 mg total) by mouth daily with breakfast. Restart on 10/02/19. ?Patient taking differently: Take 1,000 mg by mouth daily. Restart  on 10/02/19. 04/05/20  Yes Shamleffer, Melanie Crazier, MD  ?nitroGLYCERIN (NITROSTAT) 0.4 MG SL tablet Place 0.4 mg under the tongue every 5 (five) minutes as needed for chest pain.  08/19/19  Yes [provider]  ?Omega-3 Fatty Acids (FISH OIL) 1000 MG CAPS Take by mouth daily at 8 pm.   Yes [provider]  ?pioglitazone (ACTOS) 45 MG tablet Take 1 tablet (45 mg total) by mouth daily. 11/06/20  Yes Shamleffer, Melanie Crazier, MD  ?ranolazine (RANEXA) 500 MG 12 hr tablet Take 1 tablet (500 mg total) by mouth 2 (two) times daily. 10/24/20  Yes Wellington Hampshire, MD  ?vitamin B-12 (CYANOCOBALAMIN) 1000 MCG tablet Take 1,000 mcg by mouth daily.   Yes [provider]  ?Accu-Chek FastClix Lancets MISC Use as instructed to check blood sugar up to 2 times daily ?Patient not taking: Reported on 05/17/2021 04/04/20   Shamleffer, Melanie Crazier, MD  ?ACCU-CHEK GUIDE test strip USE AS INSTRUCTED TO CHECK BLOOD SUGAR UP TO 2 TIMES DAILY ?Patient not taking: Reported on 05/17/2021 05/17/21   Shamleffer, Melanie Crazier, MD  ?Accu-Chek Softclix Lancets lancets

## 2021-05-18 LAB — BASIC METABOLIC PANEL
BUN/Creatinine Ratio: 16 (ref 10–24)
BUN: 31 mg/dL — ABNORMAL HIGH (ref 8–27)
CO2: 16 mmol/L — ABNORMAL LOW (ref 20–29)
Calcium: 9.7 mg/dL (ref 8.6–10.2)
Chloride: 102 mmol/L (ref 96–106)
Creatinine, Ser: 1.97 mg/dL — ABNORMAL HIGH (ref 0.76–1.27)
Glucose: 115 mg/dL — ABNORMAL HIGH (ref 70–99)
Potassium: 5.3 mmol/L — ABNORMAL HIGH (ref 3.5–5.2)
Sodium: 137 mmol/L (ref 134–144)
eGFR: 38 mL/min/{1.73_m2} — ABNORMAL LOW (ref >59)

## 2021-05-18 LAB — CBC
Hematocrit: 42.3 % (ref 37.5–51.0)
Hemoglobin: 14.1 g/dL (ref 13.0–17.7)
MCH: 30.9 pg (ref 26.6–33.0)
MCHC: 33.3 g/dL (ref 31.5–35.7)
MCV: 93 fL (ref 79–97)
Platelets: 311 10*3/uL (ref 150–450)
RBC: 4.57 x10E6/uL (ref 4.14–5.80)
RDW: 12.6 % (ref 11.6–15.4)
WBC: 8.9 10*3/uL (ref 3.4–10.8)

## 2021-05-21 ENCOUNTER — Telehealth: Payer: Self-pay | Admitting: Nurse Practitioner

## 2021-05-21 ENCOUNTER — Other Ambulatory Visit: Payer: Self-pay | Admitting: *Deleted

## 2021-05-21 DIAGNOSIS — I2510 Atherosclerotic heart disease of native coronary artery without angina pectoris: Secondary | ICD-10-CM

## 2021-05-21 NOTE — Telephone Encounter (Signed)
Left voicemail message to call back  

## 2021-05-21 NOTE — Telephone Encounter (Signed)
Patient just wanted to review labs and recommendations again. We reviewed results and confirmed his appointment for tomorrow. He verbalized understanding with no further questions at this time.  ?

## 2021-05-21 NOTE — Telephone Encounter (Signed)
Patient is calling to get lab results again.  ?

## 2021-05-21 NOTE — Telephone Encounter (Signed)
Patient was returning call. Please advise ?

## 2021-05-22 ENCOUNTER — Other Ambulatory Visit (INDEPENDENT_AMBULATORY_CARE_PROVIDER_SITE_OTHER): Payer: Medicaid Other

## 2021-05-22 DIAGNOSIS — I2510 Atherosclerotic heart disease of native coronary artery without angina pectoris: Secondary | ICD-10-CM

## 2021-05-23 ENCOUNTER — Other Ambulatory Visit: Payer: Self-pay | Admitting: Cardiovascular Disease

## 2021-05-23 LAB — BASIC METABOLIC PANEL
BUN/Creatinine Ratio: 17 (ref 10–24)
BUN: 36 mg/dL — ABNORMAL HIGH (ref 8–27)
CO2: 15 mmol/L — ABNORMAL LOW (ref 20–29)
Calcium: 9.6 mg/dL (ref 8.6–10.2)
Chloride: 102 mmol/L (ref 96–106)
Creatinine, Ser: 2.18 mg/dL — ABNORMAL HIGH (ref 0.76–1.27)
Glucose: 138 mg/dL — ABNORMAL HIGH (ref 70–99)
Potassium: 5 mmol/L (ref 3.5–5.2)
Sodium: 139 mmol/L (ref 134–144)
eGFR: 33 mL/min/{1.73_m2} — ABNORMAL LOW (ref >59)

## 2021-05-24 ENCOUNTER — Other Ambulatory Visit: Payer: Self-pay | Admitting: Nurse Practitioner

## 2021-05-24 ENCOUNTER — Other Ambulatory Visit: Payer: Self-pay | Admitting: Physician Assistant

## 2021-05-24 DIAGNOSIS — I739 Peripheral vascular disease, unspecified: Secondary | ICD-10-CM

## 2021-05-24 MED ORDER — SODIUM CHLORIDE 0.9 % IV SOLN: INTRAVENOUS | Status: AC

## 2021-05-24 NOTE — Progress Notes (Signed)
Orders for pre-cath hydration placed. ?

## 2021-05-25 ENCOUNTER — Encounter
Admission: RE | Disposition: A | Discharge status: Home / Self Care | Payer: Self-pay | Source: Home / Self Care | Attending: Cardiovascular Disease

## 2021-05-25 ENCOUNTER — Other Ambulatory Visit: Payer: Self-pay

## 2021-05-25 ENCOUNTER — Encounter: Payer: Self-pay | Admitting: Cardiovascular Disease

## 2021-05-25 ENCOUNTER — Ambulatory Visit
Admission: RE | Admit: 2021-05-25 | Discharge: 2021-05-25 | Disposition: A | Discharge status: Home / Self Care | Payer: Medicaid Other | Attending: Cardiovascular Disease | Admitting: Cardiovascular Disease

## 2021-05-25 DIAGNOSIS — G4730 Sleep apnea, unspecified: Secondary | ICD-10-CM | POA: Diagnosis not present

## 2021-05-25 DIAGNOSIS — T82855A Stenosis of coronary artery stent, initial encounter: Secondary | ICD-10-CM | POA: Insufficient documentation

## 2021-05-25 DIAGNOSIS — F17210 Nicotine dependence, cigarettes, uncomplicated: Secondary | ICD-10-CM | POA: Diagnosis not present

## 2021-05-25 DIAGNOSIS — E1151 Type 2 diabetes mellitus with diabetic peripheral angiopathy without gangrene: Secondary | ICD-10-CM | POA: Diagnosis not present

## 2021-05-25 DIAGNOSIS — I25750 Atherosclerosis of native coronary artery of transplanted heart with unstable angina: Secondary | ICD-10-CM

## 2021-05-25 DIAGNOSIS — Z7982 Long term (current) use of aspirin: Secondary | ICD-10-CM | POA: Insufficient documentation

## 2021-05-25 DIAGNOSIS — Z7902 Long term (current) use of antithrombotics/antiplatelets: Secondary | ICD-10-CM | POA: Insufficient documentation

## 2021-05-25 DIAGNOSIS — E782 Mixed hyperlipidemia: Secondary | ICD-10-CM | POA: Diagnosis not present

## 2021-05-25 DIAGNOSIS — N183 Chronic kidney disease, stage 3 unspecified: Secondary | ICD-10-CM | POA: Diagnosis not present

## 2021-05-25 DIAGNOSIS — Y832 Surgical operation with anastomosis, bypass or graft as the cause of abnormal reaction of the patient, or of later complication, without mention of misadventure at the time of the procedure: Secondary | ICD-10-CM | POA: Insufficient documentation

## 2021-05-25 DIAGNOSIS — E1122 Type 2 diabetes mellitus with diabetic chronic kidney disease: Secondary | ICD-10-CM | POA: Insufficient documentation

## 2021-05-25 DIAGNOSIS — K219 Gastro-esophageal reflux disease without esophagitis: Secondary | ICD-10-CM | POA: Diagnosis not present

## 2021-05-25 DIAGNOSIS — I252 Old myocardial infarction: Secondary | ICD-10-CM | POA: Insufficient documentation

## 2021-05-25 DIAGNOSIS — Z79899 Other long term (current) drug therapy: Secondary | ICD-10-CM | POA: Diagnosis not present

## 2021-05-25 DIAGNOSIS — Z7984 Long term (current) use of oral hypoglycemic drugs: Secondary | ICD-10-CM | POA: Insufficient documentation

## 2021-05-25 DIAGNOSIS — I25118 Atherosclerotic heart disease of native coronary artery with other forms of angina pectoris: Secondary | ICD-10-CM | POA: Insufficient documentation

## 2021-05-25 DIAGNOSIS — I493 Ventricular premature depolarization: Secondary | ICD-10-CM | POA: Insufficient documentation

## 2021-05-25 DIAGNOSIS — Z6834 Body mass index (BMI) 34.0-34.9, adult: Secondary | ICD-10-CM | POA: Diagnosis not present

## 2021-05-25 DIAGNOSIS — Z955 Presence of coronary angioplasty implant and graft: Secondary | ICD-10-CM | POA: Insufficient documentation

## 2021-05-25 DIAGNOSIS — I129 Hypertensive chronic kidney disease with stage 1 through stage 4 chronic kidney disease, or unspecified chronic kidney disease: Secondary | ICD-10-CM | POA: Insufficient documentation

## 2021-05-25 DIAGNOSIS — I2510 Atherosclerotic heart disease of native coronary artery without angina pectoris: Secondary | ICD-10-CM

## 2021-05-25 HISTORY — PX: LEFT HEART CATH AND CORONARY ANGIOGRAPHY: CATH118249

## 2021-05-25 LAB — GLUCOSE, CAPILLARY: Glucose-Capillary: 151 mg/dL — ABNORMAL HIGH (ref 70–99)

## 2021-05-25 SURGERY — LEFT HEART CATH AND CORONARY ANGIOGRAPHY
Anesthesia: Moderate Sedation | Laterality: Left

## 2021-05-25 MED ORDER — HEPARIN SODIUM (PORCINE) 1000 UNIT/ML IJ SOLN
INTRAMUSCULAR | Status: DC | PRN
Start: 2021-05-25 — End: 2021-05-25
  Administered 2021-05-25: 5000 [IU] via INTRAVENOUS

## 2021-05-25 MED ORDER — SODIUM CHLORIDE 0.9 % IV SOLN
INTRAVENOUS | Status: DC | PRN
  Administered 2021-05-25: 250 mL via INTRAVENOUS

## 2021-05-25 MED ORDER — LIDOCAINE HCL 1 % IJ SOLN
INTRAMUSCULAR | Status: AC
  Filled 2021-05-25: qty 20

## 2021-05-25 MED ORDER — FENTANYL CITRATE (PF) 100 MCG/2ML IJ SOLN
INTRAMUSCULAR | Status: AC
  Filled 2021-05-25: qty 2

## 2021-05-25 MED ORDER — MIDAZOLAM HCL 2 MG/2ML IJ SOLN
INTRAMUSCULAR | Status: AC
  Filled 2021-05-25: qty 2

## 2021-05-25 MED ORDER — FENTANYL CITRATE (PF) 100 MCG/2ML IJ SOLN
INTRAMUSCULAR | Status: DC | PRN
  Administered 2021-05-25: 25 ug via INTRAVENOUS

## 2021-05-25 MED ORDER — VERAPAMIL HCL 2.5 MG/ML IV SOLN
INTRAVENOUS | Status: AC
  Filled 2021-05-25: qty 2

## 2021-05-25 MED ORDER — LIDOCAINE HCL (PF) 1 % IJ SOLN
INTRAMUSCULAR | Status: DC | PRN
Start: 2021-05-25 — End: 2021-05-25
  Administered 2021-05-25: 2 mL

## 2021-05-25 MED ORDER — MIDAZOLAM HCL 2 MG/2ML IJ SOLN
INTRAMUSCULAR | Status: DC | PRN
  Administered 2021-05-25: 1 mg via INTRAVENOUS

## 2021-05-25 MED ORDER — HEPARIN (PORCINE) IN NACL 2000-0.9 UNIT/L-% IV SOLN
INTRAVENOUS | Status: DC | PRN
Start: 2021-05-25 — End: 2021-05-25
  Administered 2021-05-25: 1000 mL

## 2021-05-25 MED ORDER — SODIUM CHLORIDE 0.9 % WEIGHT BASED INFUSION
3.0000 mL/kg/h | INTRAVENOUS | Status: AC
  Administered 2021-05-25: 3 mL/kg/h via INTRAVENOUS

## 2021-05-25 MED ORDER — ASPIRIN 81 MG PO CHEW
81.0000 mg | CHEWABLE_TABLET | ORAL | Status: AC
  Administered 2021-05-25: 81 mg via ORAL

## 2021-05-25 MED ORDER — SODIUM CHLORIDE 0.9% FLUSH: 3.0000 mL | Freq: Two times a day (BID) | INTRAVENOUS | Status: DC

## 2021-05-25 MED ORDER — SODIUM CHLORIDE 0.9 % IV SOLN: 250.0000 mL | INTRAVENOUS | Status: DC | PRN

## 2021-05-25 MED ORDER — HEPARIN SODIUM (PORCINE) 1000 UNIT/ML IJ SOLN
INTRAMUSCULAR | Status: AC
  Filled 2021-05-25: qty 10

## 2021-05-25 MED ORDER — VERAPAMIL HCL 2.5 MG/ML IV SOLN
INTRAVENOUS | Status: DC | PRN
  Administered 2021-05-25: 2.5 mg via INTRA_ARTERIAL

## 2021-05-25 MED ORDER — HEPARIN (PORCINE) IN NACL 1000-0.9 UT/500ML-% IV SOLN
INTRAVENOUS | Status: AC
  Filled 2021-05-25: qty 1000

## 2021-05-25 MED ORDER — IOHEXOL 300 MG/ML  SOLN
INTRAMUSCULAR | Status: DC | PRN
  Administered 2021-05-25: 37 mL

## 2021-05-25 MED ORDER — SODIUM CHLORIDE 0.9 % WEIGHT BASED INFUSION: 1.0000 mL/kg/h | INTRAVENOUS | Status: DC

## 2021-05-25 MED ORDER — ASPIRIN 81 MG PO CHEW
CHEWABLE_TABLET | ORAL | Status: AC
  Filled 2021-05-25: qty 1

## 2021-05-25 MED ORDER — SODIUM CHLORIDE 0.9% FLUSH: 3.0000 mL | INTRAVENOUS | Status: DC | PRN

## 2021-05-25 SURGICAL SUPPLY — 10 items
CATH 5FR JL3.5 JR4 ANG PIG MP (CATHETERS) ×1 IMPLANT
DEVICE RAD TR BAND REGULAR (VASCULAR PRODUCTS) ×1 IMPLANT
DRAPE BRACHIAL (DRAPES) ×1 IMPLANT
GLIDESHEATH SLEND SS 6F .021 (SHEATH) ×1 IMPLANT
GUIDEWIRE INQWIRE 1.5J.035X260 (WIRE) IMPLANT
INQWIRE 1.5J .035X260CM (WIRE) ×2
PACK CARDIAC CATH (CUSTOM PROCEDURE TRAY) ×2 IMPLANT
PROTECTION STATION PRESSURIZED (MISCELLANEOUS) ×2
SET ATX SIMPLICITY (MISCELLANEOUS) ×1 IMPLANT
STATION PROTECTION PRESSURIZED (MISCELLANEOUS) IMPLANT

## 2021-05-25 NOTE — Interval H&P Note (Signed)
**Note Jeff-Identified via Obfuscation** Cath Lab Visit (complete for each Cath Lab visit) ? ?Clinical Evaluation Leading to the Procedure:  ? ?ACS: No. ? ?Non-ACS:   ? ?Anginal Classification: CCS III ? ?Anti-ischemic medical therapy: Maximal Therapy (2 or more classes of medications) ? ?Non-Invasive Test Results: No non-invasive testing performed ? ?Prior CABG: No previous CABG ? ? ? ? ? ?History and Physical Interval Note: ? ?05/25/2021 ?11:21 AM ? ?[name]  has presented today for surgery, with the diagnosis of LT Cath   CAD ?PT NEEDS 4 HRS HYDRATION   ARRIVE 6:30A.  The various methods of treatment have been discussed with the patient and family. After consideration of risks, benefits and other options for treatment, the patient has consented to  Procedure(s): ?LEFT HEART CATH AND CORONARY ANGIOGRAPHY (Left) as a surgical intervention.  The patient's history has been reviewed, patient examined, no change in status, stable for surgery.  I have reviewed the patient's chart and labs.  Questions were answered to the patient's satisfaction.   ? ? ?Kathlyn Sacramento ? ? ?

## 2021-06-01 ENCOUNTER — Ambulatory Visit (INDEPENDENT_AMBULATORY_CARE_PROVIDER_SITE_OTHER): Payer: Medicaid Other

## 2021-06-01 DIAGNOSIS — I739 Peripheral vascular disease, unspecified: Secondary | ICD-10-CM

## 2021-06-01 DIAGNOSIS — I25750 Atherosclerosis of native coronary artery of transplanted heart with unstable angina: Secondary | ICD-10-CM

## 2021-06-15 ENCOUNTER — Ambulatory Visit: Payer: Medicaid Other | Admitting: Nurse Practitioner

## 2021-06-15 ENCOUNTER — Ambulatory Visit (INDEPENDENT_AMBULATORY_CARE_PROVIDER_SITE_OTHER): Payer: Medicaid Other | Admitting: Cardiovascular Disease

## 2021-06-15 ENCOUNTER — Encounter: Payer: Self-pay | Admitting: Cardiovascular Disease

## 2021-06-15 VITALS — BP 100/60 | HR 69 | Ht 66.0 in | Wt 217.0 lb

## 2021-06-15 DIAGNOSIS — I10 Essential (primary) hypertension: Secondary | ICD-10-CM

## 2021-06-15 DIAGNOSIS — I25118 Atherosclerotic heart disease of native coronary artery with other forms of angina pectoris: Secondary | ICD-10-CM

## 2021-06-15 DIAGNOSIS — Z720 Tobacco use: Secondary | ICD-10-CM

## 2021-06-15 DIAGNOSIS — I493 Ventricular premature depolarization: Secondary | ICD-10-CM

## 2021-06-15 DIAGNOSIS — E782 Mixed hyperlipidemia: Secondary | ICD-10-CM | POA: Diagnosis not present

## 2021-06-15 DIAGNOSIS — E1165 Type 2 diabetes mellitus with hyperglycemia: Secondary | ICD-10-CM

## 2021-06-15 MED ORDER — ISOSORBIDE MONONITRATE ER 30 MG PO TB24: 30.0000 mg | ORAL_TABLET | Freq: Every day | ORAL | 2 refills | Status: DC

## 2021-06-15 NOTE — Progress Notes (Signed)
?  ?Cardiology Office Note ? ? ?Date:  06/15/2021  ? ?ID:  [name], DOB 1960-05-02, MRN [id number] ? ?PCP:  Wayland Denis, PA-C  ?Cardiologist:  Kathlyn Sacramento, MD  ? ?Chief Complaint  ?Patient presents with  ? Other  ?  Post cath/ABI no complaints today. Meds reviewed verbally with pt.  ? ? ?  ?History of Present Illness: ?[name] is a 61 y.o. male who presents for a follow-up visit regarding coronary artery disease and stable angina. ?He has known history of uncontrolled diabetes, essential hypertension, hyperlipidemia, obesity, sleep apnea, tobacco use and GERD. ?He has remote history of non-STEMI status post stenting of the mid right coronary artery.  He presented in July, 2021 with unstable angina.  He underwent cardiac catheterization which showed patent LAD stent with mild restenosis.  There was 80% stenosis in the distal LAD, 80% stenosis in the ramus branch and moderate left circumflex disease.  The RCA had severe proximal and mid disease with significant in-stent restenosis in the midsegment.  The proximal portion was heavily calcified and tortuous.  There was moderate distal RCA stenosis and 60 to 70% ostial right PDA stenosis.  Dr. Clayborn Bigness attempted RCA PCI but was able to place only a proximal RCA stent.  No other balloons could not be passed in the proximal to mid segment which was left to be treated medically but the patient continued to have angina.  Thus, the patient was referred to me to attempt RCA PCI.  This was done at [facility] in August 2021..  The procedure was extremely difficult due to calcifications and tortuosity but I was able to do intravascular lithotripsy and drug eluting stent placement to the proximal right coronary artery.  ?He continued to have angina after the procedure and thus he underwent repeat catheterization which showed no significant change in coronary anatomy.  The stents were patent. ?He had stable symptoms since that time and was placed on more antianginal  therapy.  ?Unfortunately, his son died in May of 1324 of complications related to colon cancer.  The patient started smoking more after that. ? ?He will he was seen recently for worsening angina.  Cardiac catheterization was done on the seventh which showed heavily calcified coronary arteries with patent LAD and RCA stents with mild in-stent restenosis.  There was stable diffuse moderate left circumflex disease and significant distal LAD disease.  There was mild progression and disease in the distal RCA, right PDA and ramus branch but otherwise no significant change from before and no targets for revascularization.  His LVEDP was 12 mmHg. ? ?He has been doing reasonably well overall with improvement and chest pain and shortness of breath.  Unfortunately, he continues to smoke 1 pack/day.  He continues to have intermittent episodes of dizziness and tingling in his head. ? ?He is limited by bilateral hip pain with walking.  He underwent recent ABI which was normal bilaterally. ? ?Past Medical History:  ?Diagnosis Date  ? CKD (chronic kidney disease), stage III ([place])   ? Coronary artery disease   ? a.  H/o NSTEMI s/p LAD/RCA stenting; b. 08/2019 PCI: RCA 99p (2.5x15 Resolute Onyx DES), 75p/m, mild ISR; c. 09/2019 PCI: 85p - heavily calcified (intravascular lithotripsy & 3.0x26 Resolute Onyx DES); d. 10/18/19 Relook cath: LM 25, LAD 68mISR, 75d, 70/50d/apical, RI 80, LCX 50p/m/d, RCA 30p ISR, 285mSR, 50d, RPDA 60-->Med Rx.  ? Diabetes mellitus without complication ([place]  ? Diastolic dysfunction   ? a.  08/2019 Echo: EF 50-55%, no rwma, Gr1 DD, nl RV size/fxn. Triv MR.  ? GERD (gastroesophageal reflux disease)   ? Hyperlipidemia   ? Hypertension   ? NSTEMI (non-ST elevated myocardial infarction) ([place]) 08/2019  ? Sleep apnea   ? Tobacco abuse   ? ? ?Past Surgical History:  ?Procedure Laterality Date  ? APPENDECTOMY    ? CARDIAC CATHETERIZATION    ? COLONOSCOPY    ? COLONOSCOPY WITH PROPOFOL N/A 09/16/2017  ? Procedure:  COLONOSCOPY WITH PROPOFOL;  Surgeon: Toledo, Benay Pike, MD;  Location: [facility] ENDOSCOPY;  Service: Gastroenterology;  Laterality: N/A;  (+) DM - oral  ? CORONARY STENT INTERVENTION N/A 08/31/2019  ? Procedure: CORONARY STENT INTERVENTION;  Surgeon: Yolonda Kida, MD;  Location: [place] [facility];  Service: Cardiovascular;  Laterality: N/A;  ? CORONARY STENT INTERVENTION  09/29/2019  ? CORONARY STENT INTERVENTION N/A 09/29/2019  ? Procedure: CORONARY STENT INTERVENTION;  Surgeon: Wellington Hampshire, MD;  Location: [place] [facility];  Service: Cardiovascular;  Laterality: N/A;  ? EXPLORATORY LAPAROTOMY    ? EYE SURGERY    ? HERNIA REPAIR    ? LEFT HEART CATH AND CORONARY ANGIOGRAPHY N/A 08/31/2019  ? Procedure: LEFT HEART CATH AND CORONARY ANGIOGRAPHY possible PCI and stent;  Surgeon: Yolonda Kida, MD;  Location: [place] [facility];  Service: Cardiovascular;  Laterality: N/A;  ? LEFT HEART CATH AND CORONARY ANGIOGRAPHY Left 10/18/2019  ? Procedure: LEFT HEART CATH AND CORONARY ANGIOGRAPHY;  Surgeon: Wellington Hampshire, MD;  Location: [place] [facility];  Service: Cardiovascular;  Laterality: Left;  ? LEFT HEART CATH AND CORONARY ANGIOGRAPHY Left 05/25/2021  ? Procedure: LEFT HEART CATH AND CORONARY ANGIOGRAPHY;  Surgeon: Wellington Hampshire, MD;  Location: [place] [facility];  Service: Cardiovascular;  Laterality: Left;  ? ? ? ?Current Outpatient Medications  ?Medication Sig Dispense Refill  ? Accu-Chek FastClix Lancets MISC Use as instructed to check blood sugar up to 2 times daily 102 each 5  ? ACCU-CHEK GUIDE test strip USE AS INSTRUCTED TO CHECK BLOOD SUGAR UP TO 2 TIMES DAILY 100 strip 5  ? Accu-Chek Softclix Lancets lancets Use to check blood sugars 2 times daily 100 each 12  ? aspirin EC 81 MG tablet Take 81 mg by mouth daily.    ? atorvastatin (LIPITOR) 80 MG tablet Take 80 mg by mouth daily.    ? BD PEN NEEDLE NANO U/F 32G X 4 MM MISC USE TO INJECT INSULIN DAILY 100 each 1  ? Blood Glucose Monitoring  Suppl (ACCU-CHEK GUIDE ME) w/Device KIT Use as instructed to check blood sugar up to 2 times daily 1 kit 0  ? clopidogrel (PLAVIX) 75 MG tablet TAKE 1 TABLET BY MOUTH EVERY DAY 90 tablet 1  ? dapagliflozin propanediol (FARXIGA) 10 MG TABS tablet Take 1 tablet (10 mg total) by mouth daily before breakfast. 90 tablet 2  ? EPINEPHrine 0.3 mg/0.3 mL IJ SOAJ injection Inject 0.3 mg into the muscle as needed for anaphylaxis.    ? famotidine (PEPCID) 20 MG tablet Take 20 mg by mouth daily.    ? fenofibrate (TRICOR) 145 MG tablet Take 1 tablet (145 mg total) by mouth daily. 90 tablet 3  ? insulin degludec (TRESIBA FLEXTOUCH) 100 UNIT/ML FlexTouch Pen Inject 26 Units into the skin daily. (Patient taking differently: Inject 32 Units into the skin daily.) 30 mL 3  ? metFORMIN (GLUCOPHAGE) 1000 MG tablet Take 1 tablet (1,000 mg total) by mouth daily with  breakfast. Restart on 10/02/19. (Patient taking differently: Take 1,000 mg by mouth 2 (two) times daily with a meal.) 90 tablet 3  ? metoprolol succinate (TOPROL XL) 25 MG 24 hr tablet Take 0.5 tablets (12.5 mg total) by mouth daily. 45 tablet 2  ? nitroGLYCERIN (NITROSTAT) 0.4 MG SL tablet Place 0.4 mg under the tongue every 5 (five) minutes as needed for chest pain.     ? Omega-3 Fatty Acids (FISH OIL) 1000 MG CAPS Take 1,000 mg by mouth daily.    ? pioglitazone (ACTOS) 45 MG tablet Take 1 tablet (45 mg total) by mouth daily. 90 tablet 3  ? ranolazine (RANEXA) 500 MG 12 hr tablet Take 1 tablet (500 mg total) by mouth 2 (two) times daily. (Patient taking differently: Take 500 mg by mouth daily.) 180 tablet 3  ? vitamin B-12 (CYANOCOBALAMIN) 1000 MCG tablet Take 1,000 mcg by mouth daily.    ? isosorbide mononitrate (IMDUR) 30 MG 24 hr tablet Take 1 tablet (30 mg total) by mouth daily. 90 tablet 2  ? ?No current facility-administered medications for this visit.  ? ? ?Allergies:   Shellfish allergy, Penicillins, Lisinopril, and Erythromycin  ? ? ?Social History:  The patient   reports that he has been smoking cigarettes. He has a 25.00 pack-year smoking history. He has never used smokeless tobacco. He reports that he does not drink alcohol and does not use drugs.  ? ?Family History:

## 2021-06-15 NOTE — Patient Instructions (Signed)
Medication Instructions:  ?Your physician has recommended you make the following change in your medication:  ? ?DECREASE Imdur to 30 mg daily. An Rx has been sent to your pharmacy. ? ? ?*If you need a refill on your cardiac medications before your next appointment, please call your pharmacy* ? ? ?Lab Work: ?None ordered ?If you have labs (blood work) drawn today and your tests are completely normal, you will receive your results only by: ?MyChart Message (if you have MyChart) OR ?A paper copy in the mail ?If you have any lab test that is abnormal or we need to change your treatment, we will call you to review the results. ? ? ?Testing/Procedures: ?None ordered ? ? ?Follow-Up: ?At [facility], you and your health needs are our priority.  As part of our continuing mission to provide you with exceptional heart care, we have created designated Provider Care Teams.  These Care Teams include your primary Cardiologist (physician) and Advanced Practice Providers (APPs -  Physician Assistants and Nurse Practitioners) who all work together to provide you with the care you need, when you need it. ? ?We recommend signing up for the patient portal called "MyChart".  Sign up information is provided on this After Visit Summary.  MyChart is used to connect with patients for Virtual Visits (Telemedicine).  Patients are able to view lab/test results, encounter notes, upcoming appointments, etc.  Non-urgent messages can be sent to your provider as well.   ?To learn more about what you can do with MyChart, go to [email or web address].   ? ?Your next appointment:   ?Your physician wants you to follow-up in: 6 months You will receive a reminder letter in the mail two months in advance. If you don't receive a letter, please call our office to schedule the follow-up appointment. ? ? ?The format for your next appointment:   ?In Person ? ?Provider:   ?You may see Kathlyn Sacramento, MD or one of the following Advanced Practice Providers on  your designated Care Team:   ?Murray Hodgkins, NP ?Christell Faith, PA-C ?Cadence Kathlen Mody, PA-C ? ? ?Other Instructions ?N/A ? ?Important Information About Sugar ? ? ? ? ? ? ?

## 2021-06-24 ENCOUNTER — Ambulatory Visit
Admission: EM | Admit: 2021-06-24 | Discharge: 2021-06-24 | Disposition: A | Discharge status: Home / Self Care | Payer: Medicaid Other

## 2021-06-24 ENCOUNTER — Other Ambulatory Visit: Payer: Self-pay

## 2021-06-24 ENCOUNTER — Ambulatory Visit (INDEPENDENT_AMBULATORY_CARE_PROVIDER_SITE_OTHER): Payer: Medicaid Other

## 2021-06-24 DIAGNOSIS — M79662 Pain in left lower leg: Secondary | ICD-10-CM

## 2021-06-24 DIAGNOSIS — E119 Type 2 diabetes mellitus without complications: Secondary | ICD-10-CM | POA: Insufficient documentation

## 2021-06-24 MED ORDER — CYCLOBENZAPRINE HCL 5 MG PO TABS: 5.0000 mg | ORAL_TABLET | Freq: Three times a day (TID) | ORAL | 0 refills | Status: AC | PRN

## 2021-06-24 NOTE — Discharge Instructions (Addendum)
X-ray is negative ? ?I have a low suspicion of blood clot due to lack of accompanying systems such as redness, swelling, therefore I would like you to monitor closely at home and we will move forward with treatment of muscular pain, however if any of the above symptoms occur at any point please go to the nearest hospital for evaluation and to obtain Doppler imaging, ? ?Today you have been given a shot of a steroid in office to help minimize your symptoms ? ?You may take Tylenol 500 to 1000 mg every 6 hours as needed for comfort ? ?You may use muscle relaxer three times daily as needed  in addition, be mindful this medication may make you drowsy ? ?You may apply heat over the affected area in 10 to 15-minute intervals ? ?You may continue activity as tolerated on the affected leg ? ?If symptoms continue to persist you may follow-up with orthopedics, information listed below ?

## 2021-06-24 NOTE — ED Triage Notes (Signed)
Patient woke up at about 2am with left leg pain -- calf.  ?Patient can not put any weight on it. No injury to that leg.  ?

## 2021-06-24 NOTE — ED Provider Notes (Signed)
[facility]    CSN: [id number] Arrival date & time: 06/24/21  1005      History   Chief Complaint Chief Complaint  Patient presents with   Leg Pain    Left calf    HPI [name] is a 61 y.o. male.   Patient presents with sharp left lower calf pain beginning today approximately at 3 AM.  Symptoms started abruptly and were described as a tightness.  Pain can be felt with bearing weight described as a ripping sensation.  Denies numbness, tingling, erythema, swelling, ecchymosis, precipitating event, trauma or injury.  Has not attempted treatment of symptoms.  History of a recent cardiac event, taking Plavix and aspirin.  Past Medical History:  Diagnosis Date   CKD (chronic kidney disease), stage III (HCC)    Coronary artery disease    a.  H/o NSTEMI s/p LAD/RCA stenting; b. 08/2019 PCI: RCA 99p (2.5x15 Resolute Onyx DES), 75p/m, mild ISR; c. 09/2019 PCI: 85p - heavily calcified (intravascular lithotripsy & 3.0x26 Resolute Onyx DES); d. 10/18/19 Relook cath: LM 25, LAD 26m ISR, 75d, 70/50d/apical, RI 80, LCX 50p/m/d, RCA 30p ISR, 77m ISR, 50d, RPDA 60-->Med Rx.   Diabetes mellitus without complication (HCC)    Diastolic dysfunction    a. 08/2019 Echo: EF 50-55%, no rwma, Gr1 DD, nl RV size/fxn. Triv MR.   GERD (gastroesophageal reflux disease)    Hyperlipidemia    Hypertension    NSTEMI (non-ST elevated myocardial infarction) (HCC) 08/2019   Sleep apnea    Tobacco abuse     Patient Active Problem List   Diagnosis Date Noted   Diabetes mellitus type 2, uncomplicated (HCC) 06/24/2021   Stage 3b chronic kidney disease (HCC) 03/09/2021   Uncontrolled type 2 diabetes mellitus with hyperglycemia, with long-term current use of insulin (HCC) 01/01/2021   Acute on chronic renal insufficiency 12/31/2019   Type 2 diabetes mellitus with hyperglycemia, without long-term current use of insulin (HCC) 11/03/2019   Diabetes mellitus (HCC) 11/03/2019   Type 2 diabetes mellitus with  stage 3a chronic kidney disease, without long-term current use of insulin (HCC) 11/03/2019   Hyperlipidemia, mixed 11/03/2019   Status post coronary artery stent placement    Angina, class III (HCC) 09/29/2019   Unstable angina (HCC) 08/31/2019   Poorly controlled diabetes mellitus (HCC) 08/31/2019   Coronary artery disease    Essential hypertension    Nicotine dependence    OSA on CPAP 07/16/2017   Tobacco abuse 07/16/2017   Gastroesophageal reflux disease without esophagitis 07/16/2017    Past Surgical History:  Procedure Laterality Date   APPENDECTOMY     CARDIAC CATHETERIZATION     COLONOSCOPY     COLONOSCOPY WITH PROPOFOL N/A 09/16/2017   Procedure: COLONOSCOPY WITH PROPOFOL;  Surgeon: Toledo, Boykin Nearing, MD;  Location: [facility];  Service: Gastroenterology;  Laterality: N/A;  (+) DM - oral   CORONARY STENT INTERVENTION N/A 08/31/2019   Procedure: CORONARY STENT INTERVENTION;  Surgeon: Alwyn Pea, MD;  Location: [facility];  Service: Cardiovascular;  Laterality: N/A;   CORONARY STENT INTERVENTION  09/29/2019   CORONARY STENT INTERVENTION N/A 09/29/2019   Procedure: CORONARY STENT INTERVENTION;  Surgeon: Iran Ouch, MD;  Location: [facility];  Service: Cardiovascular;  Laterality: N/A;   EXPLORATORY LAPAROTOMY     EYE SURGERY     HERNIA REPAIR     LEFT HEART CATH AND CORONARY ANGIOGRAPHY N/A 08/31/2019   Procedure: LEFT HEART CATH AND CORONARY ANGIOGRAPHY  possible PCI and stent;  Surgeon: Alwyn Pea, MD;  Location: [facility];  Service: Cardiovascular;  Laterality: N/A;   LEFT HEART CATH AND CORONARY ANGIOGRAPHY Left 10/18/2019   Procedure: LEFT HEART CATH AND CORONARY ANGIOGRAPHY;  Surgeon: Iran Ouch, MD;  Location: [facility];  Service: Cardiovascular;  Laterality: Left;   LEFT HEART CATH AND CORONARY ANGIOGRAPHY Left 05/25/2021   Procedure: LEFT HEART CATH AND CORONARY ANGIOGRAPHY;  Surgeon: Iran Ouch,  MD;  Location: [facility];  Service: Cardiovascular;  Laterality: Left;       Home Medications    Prior to Admission medications   Medication Sig Start Date End Date Taking? Authorizing Provider  Accu-Chek FastClix Lancets MISC Use as instructed to check blood sugar up to 2 times daily 04/04/20  Yes Shamleffer, Konrad Dolores, MD  ACCU-CHEK GUIDE test strip USE AS INSTRUCTED TO CHECK BLOOD SUGAR UP TO 2 TIMES DAILY 05/17/21  Yes Shamleffer, Konrad Dolores, MD  Accu-Chek Softclix Lancets lancets Use to check blood sugars 2 times daily 03/28/21  Yes Shamleffer, Konrad Dolores, MD  aspirin EC 81 MG tablet Take 81 mg by mouth daily.   Yes [provider]  atorvastatin (LIPITOR) 80 MG tablet Take 80 mg by mouth daily.   Yes [provider]  B-D UF III MINI PEN NEEDLES 31G X 5 MM MISC SMARTSIG:injection Daily 04/25/21  Yes [provider]  BD PEN NEEDLE NANO U/F 32G X 4 MM MISC USE TO INJECT INSULIN DAILY 04/25/21  Yes Shamleffer, Konrad Dolores, MD  Blood Glucose Monitoring Suppl (ACCU-CHEK GUIDE ME) w/Device KIT Use as instructed to check blood sugar up to 2 times daily 03/26/21  Yes Shamleffer, Konrad Dolores, MD  clopidogrel (PLAVIX) 75 MG tablet TAKE 1 TABLET BY MOUTH EVERY DAY 05/24/21  Yes [place] Ouch, MD  dapagliflozin propanediol (FARXIGA) 10 MG TABS tablet Take 1 tablet (10 mg total) by mouth daily before breakfast. 11/06/20  Yes Shamleffer, Konrad Dolores, MD  EPINEPHrine 0.3 mg/0.3 mL IJ SOAJ injection Inject 0.3 mg into the muscle as needed for anaphylaxis. 06/10/15  Yes [provider]  erythromycin ophthalmic ointment every night.   Yes [provider]  famotidine (PEPCID) 20 MG tablet Take 20 mg by mouth daily.   Yes [provider]  fenofibrate (TRICOR) 145 MG tablet Take 1 tablet (145 mg total) by mouth daily. 11/20/20  Yes Shamleffer, Konrad Dolores, MD  insulin degludec (TRESIBA FLEXTOUCH) 100 UNIT/ML FlexTouch Pen  Inject 26 Units into the skin daily. Patient taking differently: Inject 32 Units into the skin daily. 11/06/20  Yes Shamleffer, Konrad Dolores, MD  isosorbide mononitrate (IMDUR) 30 MG 24 hr tablet Take 1 tablet (30 mg total) by mouth daily. 06/15/21  Yes [place] Ouch, MD  lisinopril (ZESTRIL) 2.5 MG tablet Take 2.5 mg by mouth daily. 05/06/21  Yes [provider]  metFORMIN (GLUCOPHAGE) 1000 MG tablet Take 1 tablet (1,000 mg total) by mouth daily with breakfast. Restart on 10/02/19. Patient taking differently: Take 1,000 mg by mouth 2 (two) times daily with a meal. 04/05/20  Yes Shamleffer, Konrad Dolores, MD  metoprolol succinate (TOPROL XL) 25 MG 24 hr tablet Take 0.5 tablets (12.5 mg total) by mouth daily. 05/17/21  Yes Creig Hines, NP  nitroGLYCERIN (NITROSTAT) 0.4 MG SL tablet Place 0.4 mg under the tongue every 5 (five) minutes as needed for chest pain.  08/19/19  Yes [provider]  Omega-3 Fatty Acids (FISH  OIL) 1000 MG CAPS Take 1,000 mg by mouth daily.   Yes [provider]  pioglitazone (ACTOS) 45 MG tablet Take 1 tablet (45 mg total) by mouth daily. 11/06/20  Yes Shamleffer, Konrad Dolores, MD  ranolazine (RANEXA) 500 MG 12 hr tablet Take 1 tablet (500 mg total) by mouth 2 (two) times daily. Patient taking differently: Take 500 mg by mouth daily. 10/24/20  Yes [place] Ouch, MD  vitamin B-12 (CYANOCOBALAMIN) 1000 MCG tablet Take 1,000 mcg by mouth daily.   Yes [provider]    Family History Family History  Problem Relation Age of Onset   Heart disease Father    Heart attack Father    Hypertension Father    Diabetes Father     Social History Social History   Tobacco Use   Smoking status: Every Day    Packs/day: 0.50    Years: 50.00    Pack years: 25.00    Types: Cigarettes   Smokeless tobacco: Never   Tobacco comments:    Patient states he is ready to quit, focus today is heart catheterization  Vaping Use   Vaping  Use: Never used  Substance Use Topics   Alcohol use: No   Drug use: No     Allergies   Shellfish allergy, Penicillins, Lisinopril, and Erythromycin   Review of Systems Review of Systems  Constitutional: Negative.   Respiratory: Negative.    Cardiovascular: Negative.   Skin: Negative.     Physical Exam Triage Vital Signs ED Triage Vitals  Enc Vitals Group     BP 06/24/21 1039 103/74     Pulse Rate 06/24/21 1039 69     Resp 06/24/21 1039 20     Temp 06/24/21 1039 98.2 F (36.8 C)     Temp Source 06/24/21 1039 Oral     SpO2 06/24/21 1039 99 %     Weight 06/24/21 1037 215 lb (97.5 kg)     Height 06/24/21 1037 5\' 6"  (1.676 m)     Head Circumference --      Peak Flow --      Pain Score 06/24/21 1037 8     Pain Loc --      Pain Edu? --      Excl. in GC? --    No data found.  Updated Vital Signs BP 103/74 (BP Location: Left Arm)   Pulse 69   Temp 98.2 F (36.8 C) (Oral)   Resp 20   Ht 5\' 6"  (1.676 m)   Wt 215 lb (97.5 kg)   SpO2 99%   BMI 34.70 kg/m   Visual Acuity Right Eye Distance:   Left Eye Distance:   Bilateral Distance:    Right Eye Near:   Left Eye Near:    Bilateral Near:     Physical Exam Constitutional:      Appearance: Normal appearance.  HENT:     Head: Normocephalic.  Eyes:     Extraocular Movements: Extraocular movements intact.  Pulmonary:     Effort: Pulmonary effort is normal.  Musculoskeletal:     Comments: Tenderness along the medial aspect of the left posterior calf, no point tenderness noted, no swelling, ecchymosis noted, 2+ popliteal pulse  Skin:    General: Skin is warm and dry.  Neurological:     Mental Status: He is alert and oriented to person, place, and time. Mental status is at baseline.  Psychiatric:        Mood and Affect: Mood normal.  Behavior: Behavior normal.     UC Treatments / Results  Labs (all labs ordered are listed, but only abnormal results are displayed) Labs Reviewed - No data to  display  EKG   Radiology No results found.  Procedures Procedures (including critical care time)  Medications Ordered in UC Medications - No data to display  Initial Impression / Assessment and Plan / UC Course  I have reviewed the triage vital signs and the nursing notes.  Pertinent labs & imaging results that were available during my care of the patient were reviewed by me and considered in my medical decision making (see chart for details).  Pain of left calf  Vital signs are stable, patient is in no signs of distress, no respiratory involvement, tenderness noted in the left without erythema or swelling, low suspicion for DVT, discussed with patient, x-ray negative, discussed findings with patient, recommended close monitoring and treatment for musculoskeletal pain, declined in office injection, prescribed Flexeril 5 mg daily for an over-the-counter Tylenol, recommended RICE, heat, pillows for support and activity as tolerated for additional care, patient given strict precautions for any worsening calf pain or new erythema, swelling patient to go to the nearest emergency department for further evaluation and work-up Final Clinical Impressions(s) / UC Diagnoses   Final diagnoses:  None   Discharge Instructions   None    ED Prescriptions   None    PDMP not reviewed this encounter.   Valinda Hoar, NP 06/24/21 1158

## 2021-07-05 ENCOUNTER — Ambulatory Visit (INDEPENDENT_AMBULATORY_CARE_PROVIDER_SITE_OTHER): Payer: Medicaid Other | Admitting: Internal Medicine

## 2021-07-05 ENCOUNTER — Encounter: Payer: Self-pay | Admitting: Internal Medicine

## 2021-07-05 VITALS — BP 110/72 | HR 69 | Ht 66.0 in | Wt 216.0 lb

## 2021-07-05 DIAGNOSIS — E1159 Type 2 diabetes mellitus with other circulatory complications: Secondary | ICD-10-CM | POA: Diagnosis not present

## 2021-07-05 DIAGNOSIS — E1122 Type 2 diabetes mellitus with diabetic chronic kidney disease: Secondary | ICD-10-CM

## 2021-07-05 DIAGNOSIS — N1831 Chronic kidney disease, stage 3a: Secondary | ICD-10-CM

## 2021-07-05 DIAGNOSIS — E782 Mixed hyperlipidemia: Secondary | ICD-10-CM | POA: Diagnosis not present

## 2021-07-05 DIAGNOSIS — E1165 Type 2 diabetes mellitus with hyperglycemia: Secondary | ICD-10-CM

## 2021-07-05 DIAGNOSIS — R739 Hyperglycemia, unspecified: Secondary | ICD-10-CM

## 2021-07-05 LAB — POCT GLYCOSYLATED HEMOGLOBIN (HGB A1C): Hemoglobin A1C: 8.6 % — AB (ref 4.0–5.6)

## 2021-07-05 LAB — POCT GLUCOSE (DEVICE FOR HOME USE): Glucose Fasting, POC: 169 mg/dL — AB (ref 70–99)

## 2021-07-05 MED ORDER — TRULICITY 0.75 MG/0.5ML ~~LOC~~ SOAJ: 0.7500 mg | SUBCUTANEOUS | 0 refills | Status: DC

## 2021-07-05 MED ORDER — TRULICITY 1.5 MG/0.5ML ~~LOC~~ SOAJ: 1.5000 mg | SUBCUTANEOUS | 3 refills | Status: DC

## 2021-07-05 NOTE — Patient Instructions (Addendum)
-   Start Trulicity 8.30 mg weekly for a month, than increase to 1.5 mg weekly after that  - Continue Farxiga 10 mg daily in the morning  - Continue Tresiba 32  units ONCE daily  - Continue  Metformin 1000 mg ONCE daily  - Continue Pioglitazone 45 mg daily     HOW TO TREAT LOW BLOOD SUGARS (Blood sugar LESS THAN 70 MG/DL) Please follow the RULE OF 15 for the treatment of hypoglycemia treatment (when your (blood sugars are less than 70 mg/dL)   STEP 1: Take 15 grams of carbohydrates when your blood sugar is low, which includes:  3-4 GLUCOSE TABS  OR 3-4 OZ OF JUICE OR REGULAR SODA OR ONE TUBE OF GLUCOSE GEL    STEP 2: RECHECK blood sugar in 15 MINUTES STEP 3: If your blood sugar is still low at the 15 minute recheck --> then, go back to STEP 1 and treat AGAIN with another 15 grams of carbohydrates.

## 2021-07-05 NOTE — Progress Notes (Signed)
Name: [name]  Age/ Sex: 61 y.o., male   MRN/ DOB: [id number], [birth year]     PCP: Wayland Denis, PA-C   Reason for Endocrinology Evaluation: Type 2 Diabetes Mellitus  Initial Endocrine Consultative Visit: 11/03/2019    PATIENT IDENTIFIER: [name] is a 61 y.o. male with a past medical history of T2DM, CAD, OSA and HTN.  The patient has followed with Endocrinology clinic since 11/03/2019 for consultative assistance with management of his diabetes.  DIABETIC HISTORY:  [name] was diagnosed with DM many years ago. Trulicity has caused nausea. His hemoglobin A1c has ranged from 9.0% in 12/2019, peaking at 12.1% in 10/2019.  No personal hx of pancreatitis    On his initial visit to our clinic he had an A1c of 12.1 %. He was on metformin, Glipizide and pioglitazone. We increased Glipizide     By 12/2019 reduced metformin due to low GFR    Stopped Glipizide and started basal insulin 06/2020 as well as [place]     SUBJECTIVE:   During the last visit (11/06/2020): A1c 10.9% we increased farxiga, tresiba, continued metformin and pioglitazone       Today (07/05/2021): [name] is here for a follow up on diabetes.  He is accompanied by his wife [name]. He checks his blood sugars sporadically . The patient has not had hypoglycemic episodes since the last clinic visit.   Denies nausea, vomiting or diarrhea     HOME ENDOCRINE  REGIMEN:  Metformin 1000 mg daily  Pioglitazone 45 mg daily  Farxiga 10 mg daily in the morning  Tresiba 32 units daily  Atorvastatin 80 mg daily Fenofibrate 145 mg daily  Lisinopril 2.5 mg daily     Statin: yes ACE-I/ARB: no   METER DOWNLOAD SUMMARY:n/a         DIABETIC COMPLICATIONS: Microvascular complications:   Denies: Denies: CKD, retinopathy, neuropathy  Last Eye Exam: Completed 01/2021  Macrovascular complications:  CAD ( S/P PCI) Denies: CVA, PVD   HISTORY:  Past Medical History:  Past Medical History:   Diagnosis Date   CKD (chronic kidney disease), stage III ([place])    Coronary artery disease    a.  H/o NSTEMI s/p LAD/RCA stenting; b. 08/2019 PCI: RCA 99p (2.5x15 Resolute Onyx DES), 75p/m, mild ISR; c. 09/2019 PCI: 85p - heavily calcified (intravascular lithotripsy & 3.0x26 Resolute Onyx DES); d. 10/18/19 Relook cath: LM 25, LAD 70mISR, 75d, 70/50d/apical, RI 80, LCX 50p/m/d, RCA 30p ISR, 234mSR, 50d, RPDA 60-->Med Rx.   Diabetes mellitus without complication (HCC)    Diastolic dysfunction    a. 08/2019 Echo: EF 50-55%, no rwma, Gr1 DD, nl RV size/fxn. Triv MR.   GERD (gastroesophageal reflux disease)    Hyperlipidemia    Hypertension    NSTEMI (non-ST elevated myocardial infarction) ([place]   Sleep apnea    Tobacco abuse    Past Surgical History:  Past Surgical History:  Procedure Laterality Date   APPENDECTOMY     CARDIAC CATHETERIZATION     COLONOSCOPY     COLONOSCOPY WITH PROPOFOL N/A 09/16/2017   Procedure: COLONOSCOPY WITH PROPOFOL;  Surgeon: Toledo, TeBenay PikeMD;  Location: [facility];  Service: Gastroenterology;  Laterality: N/A;  (+) DM - oral   CORONARY STENT INTERVENTION N/A 08/31/2019   Procedure: CORONARY STENT INTERVENTION;  Surgeon: CaYolonda KidaMD;  [facility];  Service: Cardiovascular;  Laterality: N/A;   CORONARY STENT INTERVENTION  09/29/2019   CORONARY STENT INTERVENTION  N/A 09/29/2019   Procedure: CORONARY STENT INTERVENTION;  Surgeon: Wellington Hampshire, MD;  Location: [facility];  Service: Cardiovascular;  Laterality: N/A;   EXPLORATORY LAPAROTOMY     EYE SURGERY     HERNIA REPAIR     LEFT HEART CATH AND CORONARY ANGIOGRAPHY N/A 08/31/2019   Procedure: LEFT HEART CATH AND CORONARY ANGIOGRAPHY possible PCI and stent;  Surgeon: Yolonda Kida, MD;  Location: [place] [facility];  Service: Cardiovascular;  Laterality: N/A;   LEFT HEART CATH AND CORONARY ANGIOGRAPHY Left 10/18/2019   Procedure: LEFT HEART CATH AND  CORONARY ANGIOGRAPHY;  Surgeon: Wellington Hampshire, MD;  Location: [place] [facility];  Service: Cardiovascular;  Laterality: Left;   LEFT HEART CATH AND CORONARY ANGIOGRAPHY Left 05/25/2021   Procedure: LEFT HEART CATH AND CORONARY ANGIOGRAPHY;  Surgeon: Wellington Hampshire, MD;  Location: [place] [facility];  Service: Cardiovascular;  Laterality: Left;   Social History:  reports that he has been smoking cigarettes. He has a 25.00 pack-year smoking history. He has never used smokeless tobacco. He reports that he does not drink alcohol and does not use drugs. Family History:  Family History  Problem Relation Age of Onset   Heart disease Father    Heart attack Father    Hypertension Father    Diabetes Father      HOME MEDICATIONS: Allergies as of 07/05/2021       Reactions   Shellfish Allergy Anaphylaxis   Penicillins Swelling   Childhood   Lisinopril Other (See Comments)   Hyperkalemia, increased creatinine   Erythromycin Rash, Other (See Comments)   Unknown        Medication List        Accurate as of Jul 05, 2021  8:11 AM. If you have any questions, ask your nurse or doctor.          Accu-Chek FastClix Lancets Misc Use as instructed to check blood sugar up to 2 times daily   Accu-Chek Softclix Lancets lancets Use to check blood sugars 2 times daily   Accu-Chek Guide Me w/Device Kit Use as instructed to check blood sugar up to 2 times daily   Accu-Chek Guide test strip Generic drug: glucose blood USE AS INSTRUCTED TO CHECK BLOOD SUGAR UP TO 2 TIMES DAILY   aspirin EC 81 MG tablet Take 81 mg by mouth daily.   atorvastatin 80 MG tablet Commonly known as: LIPITOR Take 80 mg by mouth daily.   BD Pen Needle Nano U/F 32G X 4 MM Misc Generic drug: Insulin Pen Needle USE TO INJECT INSULIN DAILY   B-D UF III MINI PEN NEEDLES 31G X 5 MM Misc Generic drug: Insulin Pen Needle SMARTSIG:injection Daily   clopidogrel 75 MG tablet Commonly known as: PLAVIX TAKE 1  TABLET BY MOUTH EVERY DAY   cyclobenzaprine 5 MG tablet Commonly known as: FLEXERIL Take 1 tablet (5 mg total) by mouth 3 (three) times daily as needed for muscle spasms.   dapagliflozin propanediol 10 MG Tabs tablet Commonly known as: Farxiga Take 1 tablet (10 mg total) by mouth daily before breakfast.   EPINEPHrine 0.3 mg/0.3 mL Soaj injection Commonly known as: EPI-PEN Inject 0.3 mg into the muscle as needed for anaphylaxis.   erythromycin ophthalmic ointment every night.   famotidine 20 MG tablet Commonly known as: PEPCID Take 20 mg by mouth daily.   fenofibrate 145 MG tablet Commonly known as: Tricor Take 1 tablet (145 mg total) by mouth daily.  Fish Oil 1000 MG Caps Take 1,000 mg by mouth daily.   isosorbide mononitrate 30 MG 24 hr tablet Commonly known as: IMDUR Take 1 tablet (30 mg total) by mouth daily.   lisinopril 2.5 MG tablet Commonly known as: ZESTRIL Take 2.5 mg by mouth daily.   metFORMIN 1000 MG tablet Commonly known as: GLUCOPHAGE Take 1 tablet (1,000 mg total) by mouth daily with breakfast. Restart on 10/02/19. What changed:  when to take this additional instructions   metoprolol succinate 25 MG 24 hr tablet Commonly known as: Toprol XL Take 0.5 tablets (12.5 mg total) by mouth daily.   nitroGLYCERIN 0.4 MG SL tablet Commonly known as: NITROSTAT Place 0.4 mg under the tongue every 5 (five) minutes as needed for chest pain.   pioglitazone 45 MG tablet Commonly known as: ACTOS Take 1 tablet (45 mg total) by mouth daily.   ranolazine 500 MG 12 hr tablet Commonly known as: RANEXA Take 1 tablet (500 mg total) by mouth 2 (two) times daily. What changed: when to take this   [place] FlexTouch 100 UNIT/ML FlexTouch Pen Generic drug: insulin degludec Inject 26 Units into the skin daily. What changed: how much to take   vitamin B-12 1000 MCG tablet Commonly known as: CYANOCOBALAMIN Take 1,000 mcg by mouth daily.         OBJECTIVE:    Vital Signs: BP 110/72 (BP Location: Left Arm, Patient Position: Sitting, Cuff Size: Large)   Pulse 69   Ht 5' 6"  (1.676 m)   Wt 216 lb (98 kg)   SpO2 98%   BMI 34.86 kg/m   Wt Readings from Last 3 Encounters:  07/05/21 216 lb (98 kg)  06/24/21 215 lb (97.5 kg)  06/15/21 217 lb (98.4 kg)     Exam: General: Pt appears well and is in NAD  Lungs: Coarse breathing sounds   Heart: RRR   Abdomen: Normoactive bowel sounds, soft, nontender, without masses or organomegaly palpable  Extremities: No pretibial edema.  Neuro: MS is good with appropriate affect, pt is alert and Ox3       DM foot exam: 11/06/2020   The skin of the feet is intact without sores or ulcerations. The pedal pulses are 1+ on right and 1+ on left. The sensation is intact to a screening 5.07, 10 gram monofilament bilaterally    DATA REVIEWED:  Lab Results  Component Value Date   HGBA1C 8.6 (A) 07/05/2021   HGBA1C 9.5 (A) 03/09/2021   HGBA1C 10.9 (A) 11/06/2020    Latest Reference Range & Units 05/22/21 08:57  Sodium 134 - 144 mmol/L 139  Potassium 3.5 - 5.2 mmol/L 5.0  Chloride 96 - 106 mmol/L 102  CO2 20 - 29 mmol/L 15 (L)  Glucose 70 - 99 mg/dL 138 (H)  BUN 8 - 27 mg/dL 36 (H)  Creatinine 0.76 - 1.27 mg/dL 2.18 (H)  Calcium 8.6 - 10.2 mg/dL 9.6  BUN/Creatinine Ratio 10 - 24  17  eGFR >59 mL/min/1.73 33 (L)     Latest Reference Range & Units 03/09/21 10:01  TSH 0.35 - 5.50 uIU/mL 3.00   ASSESSMENT / PLAN / RECOMMENDATIONS:   1) Type 2 Diabetes Mellitus, Poorly  controlled, With CKD III and macrovascular  complications - Most recent A1c of 8.6  %. Goal A1c < 7.0.    - Praised the pt on improved glycemic control , A1c down from 9.5%  - We discussed adding GLP-1 A, cautioned against GI side effects  - Will start  with trulicity 4.09 mg weekly than increase to 1.5 mg weekly after a month   MEDICATIONS: - Start Trulicity 8.28 mg weekly for a month than increase 1.5 mg weekly  - Continue  Farxiga 10 mg daily in the morning  - Continue Tresiba 32 units daily - Continue Metformin 1000 mg ONCE daily  - Continue Pioglitazone 45 mg daily    EDUCATION / INSTRUCTIONS: BG monitoring instructions: Patient is instructed to check his blood sugars 2 times a day, before breakfast and supper. Call [facility] if: BG persistently < 70  I reviewed the Rule of 15 for the treatment of hypoglycemia in detail with the patient. Literature supplied.     2) Diabetic complications:  Eye: Does not have known diabetic retinopathy.  Neuro/ Feet: Does not have known diabetic peripheral neuropathy .  Renal: Patient does not have known baseline CKD. He   is not a candidate for  ACEI/ARB at present due to hyperkalemia . MA/Cr ratio is normal    3) Mixed Dyslipidemia:    -TG improved from 935 and 11/2020 to 200 mg/dL by 02/2021.  -No changes  Medication Continue atorvastatin 80 mg daily Continue fenofibrate 145 mg daily      F/U in 6 months   Signed electronically by: Mack Guise, MD  [facility]  [facility] [address], [place] [address] Phone: [phone] FAX: [phone]   CC: Wayland Denis, PA-C [place] [address] Phone: [phone]  Fax: [phone]  Return to Endocrinology clinic as below: No future appointments.

## 2021-07-09 ENCOUNTER — Ambulatory Visit: Payer: Medicaid Other | Admitting: Internal Medicine

## 2021-07-12 ENCOUNTER — Other Ambulatory Visit: Payer: Self-pay | Admitting: Internal Medicine

## 2021-07-19 ENCOUNTER — Telehealth: Payer: Self-pay

## 2021-07-19 ENCOUNTER — Other Ambulatory Visit (HOSPITAL_COMMUNITY): Payer: Self-pay

## 2021-07-19 NOTE — Telephone Encounter (Signed)
Patient Advocate Encounter   Received notification from [place] that prior authorization for Tresiba FlexTouch (insulin degludec injection) 100 Units/mL solution is required.   PA submitted on 07/19/2021 Key [id number] Status is pending

## 2021-07-19 NOTE — Telephone Encounter (Signed)
Patient Advocate Encounter  Prior Authorization for [place] FlexTouch (insulin degludec injection) 100 Units/mL solution has been approved.    Effective dates: 07/19/2021 through 02/17/2022

## 2021-07-19 NOTE — Telephone Encounter (Signed)
Patient Advocate Encounter  Prior Authorization for Trulicity 0.'75MG'$ /0.5ML pen-injectors has been approved.    PA#  [id number] Effective dates: 07/05/2021 through 07/19/2022  Patients co-pay is $4.

## 2021-07-19 NOTE — Telephone Encounter (Signed)
Patient Advocate Encounter   Received notification that prior authorization for Trulicity 0.'75MG'$ /0.5ML pen-injectors is required.   PA submitted on 07/19/2021 Key [id number] Status is pending

## 2021-09-14 ENCOUNTER — Other Ambulatory Visit: Payer: Self-pay | Admitting: Internal Medicine

## 2021-09-20 ENCOUNTER — Telehealth: Payer: Self-pay | Admitting: Cardiovascular Disease

## 2021-09-20 NOTE — Telephone Encounter (Signed)
Patient is requesting to talk with Dr. Fletcher Anon or nurse. Says that it is very important but would not give what message is about

## 2021-09-20 NOTE — Telephone Encounter (Signed)
Spoke with the patient. Pt sts that he has an insurance form that he needs completed. We have completed several of the same forms for him in the past. Provided our office fax# adv the pt to attn it Dr. Olga Coaster. Pt voiced appreciation for the call back.

## 2021-09-21 NOTE — Telephone Encounter (Addendum)
Patients forms received. Page 2 of the Physician statement is missing. Called the pt and made him aware. Pt sts that he will fax over page 2 when he returns home.

## 2021-09-24 NOTE — Telephone Encounter (Signed)
Forms placed on Dr. Tyrell Antonio desk.

## 2021-09-28 NOTE — Telephone Encounter (Signed)
Forms faxed and sent for batch scanning.

## 2021-11-13 ENCOUNTER — Other Ambulatory Visit: Payer: Self-pay | Admitting: Nephrology

## 2021-11-13 ENCOUNTER — Other Ambulatory Visit (HOSPITAL_COMMUNITY): Payer: Self-pay | Admitting: Nephrology

## 2021-11-13 DIAGNOSIS — E1122 Type 2 diabetes mellitus with diabetic chronic kidney disease: Secondary | ICD-10-CM

## 2021-11-13 DIAGNOSIS — N184 Chronic kidney disease, stage 4 (severe): Secondary | ICD-10-CM

## 2021-11-26 ENCOUNTER — Ambulatory Visit
Admission: RE | Admit: 2021-11-26 | Discharge: 2021-11-26 | Disposition: A | Discharge status: Home / Self Care | Payer: Medicaid Other | Source: Ambulatory Visit | Attending: Nephrology | Admitting: Nephrology

## 2021-11-26 DIAGNOSIS — E1122 Type 2 diabetes mellitus with diabetic chronic kidney disease: Secondary | ICD-10-CM | POA: Diagnosis present

## 2021-11-26 DIAGNOSIS — N184 Chronic kidney disease, stage 4 (severe): Secondary | ICD-10-CM | POA: Insufficient documentation

## 2021-12-01 ENCOUNTER — Other Ambulatory Visit: Payer: Self-pay | Admitting: Internal Medicine

## 2021-12-03 ENCOUNTER — Other Ambulatory Visit: Payer: Self-pay | Admitting: Cardiovascular Disease

## 2021-12-03 ENCOUNTER — Other Ambulatory Visit: Payer: Self-pay | Admitting: Internal Medicine

## 2021-12-03 NOTE — Telephone Encounter (Signed)
Please schedule 6 month F/U appointment for refills. Thank you!

## 2021-12-05 ENCOUNTER — Other Ambulatory Visit: Payer: Self-pay | Admitting: Internal Medicine

## 2022-01-02 ENCOUNTER — Other Ambulatory Visit (HOSPITAL_COMMUNITY): Payer: Self-pay

## 2022-01-02 ENCOUNTER — Telehealth: Payer: Self-pay

## 2022-01-02 ENCOUNTER — Ambulatory Visit (INDEPENDENT_AMBULATORY_CARE_PROVIDER_SITE_OTHER): Payer: Medicaid Other | Admitting: Internal Medicine

## 2022-01-02 ENCOUNTER — Encounter: Payer: Self-pay | Admitting: Internal Medicine

## 2022-01-02 VITALS — BP 122/70 | HR 67 | Ht 66.0 in | Wt 224.0 lb

## 2022-01-02 DIAGNOSIS — E1122 Type 2 diabetes mellitus with diabetic chronic kidney disease: Secondary | ICD-10-CM

## 2022-01-02 DIAGNOSIS — E1165 Type 2 diabetes mellitus with hyperglycemia: Secondary | ICD-10-CM

## 2022-01-02 DIAGNOSIS — E1159 Type 2 diabetes mellitus with other circulatory complications: Secondary | ICD-10-CM

## 2022-01-02 DIAGNOSIS — N184 Chronic kidney disease, stage 4 (severe): Secondary | ICD-10-CM

## 2022-01-02 DIAGNOSIS — E782 Mixed hyperlipidemia: Secondary | ICD-10-CM | POA: Insufficient documentation

## 2022-01-02 DIAGNOSIS — Z794 Long term (current) use of insulin: Secondary | ICD-10-CM

## 2022-01-02 LAB — POCT GLYCOSYLATED HEMOGLOBIN (HGB A1C): Hemoglobin A1C: 7.6 % — AB (ref 4.0–5.6)

## 2022-01-02 LAB — BASIC METABOLIC PANEL
BUN: 29 mg/dL — ABNORMAL HIGH (ref 6–23)
CO2: 26 mEq/L (ref 19–32)
Calcium: 9.4 mg/dL (ref 8.4–10.5)
Chloride: 103 mEq/L (ref 96–112)
Creatinine, Ser: 2.81 mg/dL — ABNORMAL HIGH (ref 0.40–1.50)
GFR: 23.23 mL/min — ABNORMAL LOW (ref >60.00)
Glucose, Bld: 165 mg/dL — ABNORMAL HIGH (ref 70–99)
Potassium: 5.2 mEq/L — ABNORMAL HIGH (ref 3.5–5.1)
Sodium: 134 mEq/L — ABNORMAL LOW (ref 135–145)

## 2022-01-02 LAB — LIPID PANEL
Cholesterol: 148 mg/dL (ref 0–200)
HDL: 40.8 mg/dL (ref >39.00)
LDL Cholesterol: 71 mg/dL (ref 0–99)
NonHDL: 106.72
Total CHOL/HDL Ratio: 4
Triglycerides: 177 mg/dL — ABNORMAL HIGH (ref 0.0–149.0)
VLDL: 35.4 mg/dL (ref 0.0–40.0)

## 2022-01-02 LAB — MICROALBUMIN / CREATININE URINE RATIO
Creatinine,U: 85.2 mg/dL
Microalb Creat Ratio: 0.9 mg/g (ref 0.0–30.0)
Microalb, Ur: 0.8 mg/dL (ref 0.0–1.9)

## 2022-01-02 MED ORDER — DAPAGLIFLOZIN PROPANEDIOL 10 MG PO TABS: 10.0000 mg | ORAL_TABLET | Freq: Every day | ORAL | 3 refills | Status: DC

## 2022-01-02 MED ORDER — SEMAGLUTIDE(0.25 OR 0.5MG/DOS) 2 MG/3ML ~~LOC~~ SOPN: 0.2500 mg | PEN_INJECTOR | SUBCUTANEOUS | 3 refills | Status: DC

## 2022-01-02 MED ORDER — TRESIBA FLEXTOUCH 100 UNIT/ML ~~LOC~~ SOPN: 32.0000 [IU] | PEN_INJECTOR | Freq: Every day | SUBCUTANEOUS | 4 refills | Status: DC

## 2022-01-02 MED ORDER — BD PEN NEEDLE MINI U/F 31G X 5 MM MISC: 1.0000 | Freq: Every day | 3 refills | Status: DC

## 2022-01-02 MED ORDER — ACCU-CHEK GUIDE VI STRP: 1.0000 | ORAL_STRIP | Freq: Two times a day (BID) | 3 refills | Status: AC

## 2022-01-02 MED ORDER — BD PEN NEEDLE MINI U/F 31G X 5 MM MISC: 1.0000 | Freq: Four times a day (QID) | 3 refills | Status: DC

## 2022-01-02 MED ORDER — DEXCOM G7 SENSOR MISC: 1.0000 | 3 refills | Status: DC

## 2022-01-02 MED ORDER — PIOGLITAZONE HCL 45 MG PO TABS: 45.0000 mg | ORAL_TABLET | Freq: Every day | ORAL | 3 refills | Status: DC

## 2022-01-02 NOTE — Progress Notes (Signed)
Name: [name]  Age/ Sex: 61 y.o., male   MRN/ DOB: [id number], 22-May-1960     PCP: Wayland Denis, PA-C   Reason for Endocrinology Evaluation: Type 2 Diabetes Mellitus  Initial Endocrine Consultative Visit: 11/03/2019    PATIENT IDENTIFIER: [name] is a 61 y.o. male with a past medical history of T2DM, CAD, OSA and HTN.  The patient has followed with Endocrinology clinic since 11/03/2019 for consultative assistance with management of his diabetes.  DIABETIC HISTORY:  [name] was diagnosed with DM many years ago. Trulicity has caused nausea. His hemoglobin A1c has ranged from 9.0% in 12/2019, peaking at 12.1% in 10/2019.  No personal hx of pancreatitis    On his initial visit to our clinic he had an A1c of 12.1 %. He was on metformin, Glipizide and pioglitazone. We increased Glipizide     By 12/2019 reduced metformin due to low GFG, stopped by October 2023 due to diarrhea and low GFR  Stopped Glipizide and started basal insulin 06/2020 as well as [place]    Started Trulicity in May 1601 but was unable to tolerate due to abdominal pain and stopped by October 2023   SUBJECTIVE:   During the last visit (5/18/20223): A1c 8.6 %    Today (01/02/2022): [name] is here for a follow up on diabetes.  He is accompanied by his wife [name]. He checks his blood sugars sporadically . The patient has not had hypoglycemic episodes since the last clinic visit.   Stopped Trulicity 10/3233 due to abdominal pain as well as Metformin  Has been noted with weight gain   Patient continues to follow-up with nephrology (Dr. Candiss Norse)    [place]:  Metformin 1000 mg daily - not taking  Pioglitazone 45 mg daily  Farxiga 10 mg daily in the morning  Tresiba 32 units daily  Trulicity 1.5 mg weekly- not taking  Atorvastatin 80 mg daily Fenofibrate 145 mg daily      Statin: yes ACE-I/ARB: no   METER DOWNLOAD SUMMARY:Unable to download 86-  260        DIABETIC COMPLICATIONS: Microvascular complications:   Denies: Denies: CKD, retinopathy, neuropathy  Last Eye Exam: Completed 01/2021  Macrovascular complications:  CAD ( S/P PCI) Denies: CVA, PVD   HISTORY:  Past Medical History:  Past Medical History:  Diagnosis Date   CKD (chronic kidney disease), stage III ([place])    Coronary artery disease    a.  H/o NSTEMI s/p LAD/RCA stenting; b. 08/2019 PCI: RCA 99p (2.5x15 Resolute Onyx DES), 75p/m, mild ISR; c. 09/2019 PCI: 85p - heavily calcified (intravascular lithotripsy & 3.0x26 Resolute Onyx DES); d. 10/18/19 Relook cath: LM 25, LAD 46mISR, 75d, 70/50d/apical, RI 80, LCX 50p/m/d, RCA 30p ISR, 269mSR, 50d, RPDA 60-->Med Rx.   Diabetes mellitus without complication (HCC)    Diastolic dysfunction    a. 08/2019 Echo: EF 50-55%, no rwma, Gr1 DD, nl RV size/fxn. Triv MR.   GERD (gastroesophageal reflux disease)    Hyperlipidemia    Hypertension    [place]STEMI (non-ST elevated [place] infarction) [place]   Sleep apnea    Tobacco abuse    Past Surgical History:  Past Surgical History:  Procedure Laterality Date   APPENDECTOMY     CARDIAC CATHETERIZATION     COLONOSCOPY     COLONOSCOPY WITH PROPOFOL N/A 09/16/2017   Procedure: COLONOSCOPY WITH PROPOFOL;  Surgeon: [place], TeBenay PikeMD;  Location: [facility];  Service: Gastroenterology;  Laterality:  N/A;  (+) DM - oral   CORONARY STENT INTERVENTION N/A 08/31/2019   Procedure: CORONARY STENT INTERVENTION;  Surgeon: Yolonda Kida, MD;  Location: [facility];  Service: Cardiovascular;  Laterality: N/A;   CORONARY STENT INTERVENTION  09/29/2019   CORONARY STENT INTERVENTION N/A 09/29/2019   Procedure: CORONARY STENT INTERVENTION;  Surgeon: Wellington Hampshire, MD;  Location: [place] [facility];  Service: Cardiovascular;  Laterality: N/A;   EXPLORATORY LAPAROTOMY     EYE SURGERY     HERNIA REPAIR     LEFT HEART CATH AND CORONARY ANGIOGRAPHY N/A 08/31/2019    Procedure: LEFT HEART CATH AND CORONARY ANGIOGRAPHY possible PCI and stent;  Surgeon: Yolonda Kida, MD;  Location: [place] [facility];  Service: Cardiovascular;  Laterality: N/A;   LEFT HEART CATH AND CORONARY ANGIOGRAPHY Left 10/18/2019   Procedure: LEFT HEART CATH AND CORONARY ANGIOGRAPHY;  Surgeon: Wellington Hampshire, MD;  Location: [facility];  Service: Cardiovascular;  Laterality: Left;   LEFT HEART CATH AND CORONARY ANGIOGRAPHY Left 05/25/2021   Procedure: LEFT HEART CATH AND CORONARY ANGIOGRAPHY;  Surgeon: Wellington Hampshire, MD;  Location: [facility];  Service: Cardiovascular;  Laterality: Left;   Social History:  reports that he has been smoking cigarettes. He has a 25.00 pack-year smoking history. He has never used smokeless tobacco. He reports that he does not drink alcohol and does not use drugs. Family History:  Family History  Problem Relation Age of Onset   Heart disease Father    Heart attack Father    Hypertension Father    Diabetes Father      HOME MEDICATIONS: Allergies as of 01/02/2022       Reactions   Shellfish Allergy Anaphylaxis   Penicillins Swelling   Childhood   Lisinopril Other (See Comments)   Hyperkalemia, increased creatinine   Erythromycin Rash, Other (See Comments)   Unknown        Medication List        Accurate as of January 02, 2022  9:06 AM. If you have any questions, ask your nurse or doctor.          Accu-Chek FastClix Lancets Misc Use as instructed to check blood sugar up to 2 times daily   Accu-Chek Softclix Lancets lancets Use to check blood sugars 2 times daily   Accu-Chek Guide Me w/Device Kit Use as instructed to check blood sugar up to 2 times daily   Accu-Chek Guide test strip Generic drug: glucose blood USE AS INSTRUCTED TO CHECK BLOOD SUGAR UP TO 2 TIMES DAILY   aspirin EC 81 MG tablet Take 81 mg by mouth daily.   atorvastatin 80 MG tablet Commonly known as: LIPITOR Take 80 mg by mouth  daily.   B-D UF III MINI PEN NEEDLES 31G X 5 MM Misc Generic drug: Insulin Pen Needle SMARTSIG:injection Daily   B-D UF III MINI PEN NEEDLES 31G X 5 MM Misc Generic drug: Insulin Pen Needle USE TO INJECT INSULIN DAILY   clopidogrel 75 MG tablet Commonly known as: PLAVIX TAKE 1 TABLET BY MOUTH EVERY DAY   cyanocobalamin 1000 MCG tablet Commonly known as: VITAMIN B12 Take 1,000 mcg by mouth daily.   cyclobenzaprine 5 MG tablet Commonly known as: FLEXERIL Take 1 tablet (5 mg total) by mouth 3 (three) times daily as needed for muscle spasms.   EPINEPHrine 0.3 mg/0.3 mL Soaj injection Commonly known as: EPI-PEN Inject 0.3 mg into the muscle as needed for  anaphylaxis.   erythromycin ophthalmic ointment every night.   famotidine 20 MG tablet Commonly known as: PEPCID Take 20 mg by mouth daily.   Farxiga 10 MG Tabs tablet Generic drug: dapagliflozin propanediol TAKE 1 TABLET BY MOUTH DAILY BEFORE BREAKFAST.   fenofibrate 145 MG tablet Commonly known as: TRICOR TAKE 1 TABLET BY MOUTH EVERY DAY   Fish Oil 1000 MG Caps Take 1,000 mg by mouth daily.   isosorbide mononitrate 30 MG 24 hr tablet Commonly known as: IMDUR Take 1 tablet (30 mg total) by mouth daily.   lisinopril 2.5 MG tablet Commonly known as: ZESTRIL TAKE 1 TABLET BY MOUTH EVERY DAY   metFORMIN 1000 MG tablet Commonly known as: GLUCOPHAGE Take 1 tablet (1,000 mg total) by mouth daily with breakfast. Restart on 10/02/19.   metoprolol succinate 25 MG 24 hr tablet Commonly known as: Toprol XL Take 0.5 tablets (12.5 mg total) by mouth daily.   nitroGLYCERIN 0.4 MG SL tablet Commonly known as: NITROSTAT Place 0.4 mg under the tongue every 5 (five) minutes as needed for chest pain.   pioglitazone 45 MG tablet Commonly known as: ACTOS TAKE 1 TABLET BY MOUTH EVERY DAY   ranolazine 500 MG 12 hr tablet Commonly known as: RANEXA TAKE 1 TABLET BY MOUTH TWICE A DAY   Tresiba FlexTouch 100 UNIT/ML FlexTouch  Pen Generic drug: insulin degludec Inject 32 Units into the skin daily.   Trulicity 1.5 VZ/5.6LO Sopn Generic drug: Dulaglutide Inject 1.5 mg into the skin once a week.   Trulicity 7.56 EP/3.2RJ Sopn Generic drug: Dulaglutide INJECT 0.75 MG SUBCUTANEOUSLY ONE TIME PER WEEK         OBJECTIVE:   Vital Signs: BP 122/70 (BP Location: Left Arm, Patient Position: Sitting, Cuff Size: Large)   Pulse 67   Ht _0  (1.676 m)   Wt 224 lb (101.6 kg)   SpO2 99%   BMI 36.15 kg/m   Wt Readings from Last 3 Encounters:  01/02/22 224 lb (101.6 kg)  07/05/21 216 lb (98 kg)  06/24/21 215 lb (97.5 kg)     Exam: General: Pt appears well and is in NAD  Lungs: Coarse breathing sounds   Heart: RRR   Abdomen: Normoactive bowel sounds, soft, nontender, without masses or organomegaly palpable  Extremities: No pretibial edema.  Neuro: MS is good with appropriate affect, pt is alert and Ox3       DM foot exam: 01/02/2022   The skin of the feet is intact without sores or ulcerations. The pedal pulses are 1+ on right and 1+ on left. The sensation is intact to a screening 5.07, 10 gram monofilament bilaterally    DATA REVIEWED:  Lab Results  Component Value Date   HGBA1C 7.6 (A) 01/02/2022   HGBA1C 8.6 (A) 07/05/2021   HGBA1C 9.5 (A) 03/09/2021    Latest Reference Range & Units 01/02/22 09:34  Sodium 135 - 145 mEq/L 134 (L)  Potassium 3.5 - 5.1 mEq/L 5.2 No hemolysis seen (H)  Chloride 96 - 112 mEq/L 103  CO2 19 - 32 mEq/L 26  Glucose 70 - 99 mg/dL 165 (H)  BUN 6 - 23 mg/dL 29 (H)  Creatinine 0.40 - 1.50 mg/dL 2.81 (H)  Calcium 8.4 - 10.5 mg/dL 9.4  GFR >60.00 mL/min 23.23 (L)    Latest Reference Range & Units 01/02/22 09:34  Total CHOL/HDL Ratio  4  Cholesterol 0 - 200 mg/dL 148  HDL Cholesterol >39.00 mg/dL 40.80  LDL (calc) 0 - 99 mg/dL  71  MICROALB/CREAT RATIO 0.0 - 30.0 mg/g 0.9  NonHDL  106.72  Triglycerides 0.0 - 149.0 mg/dL 177.0 (H)  VLDL 0.0 - 40.0 mg/dL 35.4     Latest Reference Range & Units 01/02/22 09:34  Creatinine,U mg/dL 85.2  Microalb, Ur 0.0 - 1.9 mg/dL 0.8  MICROALB/CREAT RATIO 0.0 - 30.0 mg/g 0.9       ASSESSMENT / PLAN / RECOMMENDATIONS:   1) Type 2 Diabetes Mellitus, Sub-optimally controlled, With CKD IV and macrovascular  complications - Most recent A1c of 7.6  %. Goal A1c < 7.0.    - Praised the pt on improved glycemic control , A1c down from 8.6%  -Unfortunately he had to discontinue the metformin due to diarrhea -He also could not tolerate the Trulicity due to abdominal pain -We discussed switching to Ozempic, he understands that he may have similar side effects but he is willing to try, we will start at a small dose and keep it there -A prescription for Dexcom G7 was sent to the pharmacy  MEDICATIONS:  -Stop metformin -Stop Trulicity - Start Ozempic 0.25 mg weekly - Continue Farxiga 10 mg daily in the morning  - Continue Tresiba 32 units daily - Continue Pioglitazone 45 mg daily    EDUCATION / INSTRUCTIONS: BG monitoring instructions: Patient is instructed to check his blood sugars 2 times a day, before breakfast and supper. Call [facility] if: BG persistently < 70  I reviewed the Rule of 15 for the treatment of hypoglycemia in detail with the patient. Literature supplied.     2) Diabetic complications:  Eye: Does not have known diabetic retinopathy.  Neuro/ Feet: Does not have known diabetic peripheral neuropathy .  Renal: Patient does not have known baseline CKD. He   is not a candidate for  ACEI/ARB at present due to hyperkalemia . MA/Cr ratio is normal    3) Mixed Dyslipidemia:    -TG improved from 935 and 11/2020 to 200 mg/dL by 02/2021 and now at 177 mg/d , I suspect this has to do improved glycemic control -LDL at goal  Medication Continue atorvastatin 80 mg daily Continue fenofibrate 145 mg daily      F/U in 4 months   Signed electronically by: Mack Guise, MD  [facility]  [address], [place] [address] Phone: [phone] FAX: [phone]   CC: Wayland Denis, PA-C [place] [address] Phone: [phone]  Fax: [phone]  Return to Endocrinology clinic as below: No future appointments.

## 2022-01-02 NOTE — Patient Instructions (Signed)
-   Start Ozempic 0.25 mg once weekly weekly  - Continue Farxiga 10 mg daily in the morning  - Continue Tresiba 30  units ONCE daily  - Continue Pioglitazone 45 mg daily     HOW TO TREAT LOW BLOOD SUGARS (Blood sugar LESS THAN 70 MG/DL) Please follow the RULE OF 15 for the treatment of hypoglycemia treatment (when your (blood sugars are less than 70 mg/dL)   STEP 1: Take 15 grams of carbohydrates when your blood sugar is low, which includes:  3-4 GLUCOSE TABS  OR 3-4 OZ OF JUICE OR REGULAR SODA OR ONE TUBE OF GLUCOSE GEL    STEP 2: RECHECK blood sugar in 15 MINUTES STEP 3: If your blood sugar is still low at the 15 minute recheck --> then, go back to STEP 1 and treat AGAIN with another 15 grams of carbohydrates.

## 2022-01-02 NOTE — Telephone Encounter (Signed)
Pharmacy Patient Advocate Encounter   Received notification from [facility] that prior authorization for Dexcom G7 Sensor is required/requested.   PA submitted on 01/02/2022 to [place] Medicaid via CoverMyMeds  [facility] PA Case ID: [id number]  Status is pending

## 2022-01-07 ENCOUNTER — Telehealth: Payer: Self-pay

## 2022-01-07 ENCOUNTER — Other Ambulatory Visit (HOSPITAL_COMMUNITY): Payer: Self-pay

## 2022-01-07 NOTE — Telephone Encounter (Signed)
Pharmacy Patient Advocate Encounter   Received notification from [facility] that prior authorization for Ozempic '2MG'$ /3ML is required/requested.     PA submitted on 01/07/2022 via CoverMyMeds Key [id number] Status is pending  Joneen Boers, [place] Patient Advocate Specialist [place] Patient Advocate Team Direct Number: [phone] Fax: [phone]

## 2022-01-07 NOTE — Telephone Encounter (Signed)
Pharmacy Patient Advocate Encounter  Prior Authorization for Ozempic '2MG'$ /3ML has been approved.    PA# [id number] Effective dates: 01/07/2022 through 01/07/2023

## 2022-01-08 NOTE — Telephone Encounter (Signed)
Pharmacy Patient Advocate Encounter  Received notification from [place] [employer] that the request for prior authorization for Dexcom G7 Sensor has been denied due to not meeting criteria.    1. the beneficiary requires two (2) or more insulin injections daily 2. the beneficiary's insulin treatment regimen requires frequent adjustment based on standard BGM  or non-therapeutic CGM testing 3. the beneficiary or caregiver(s) is willing and able to use the therapeutic CGM system as  Prescribed  Key: [id number]  Denial letter attached to charts

## 2022-01-17 ENCOUNTER — Telehealth: Payer: Self-pay | Admitting: Cardiovascular Disease

## 2022-01-17 NOTE — Telephone Encounter (Signed)
Patient is requesting to speak with [name], RN, regarding disability paperwork. He states she normally assists with completing the paperwork each year and he has an update that he would like to discuss with her.

## 2022-01-18 NOTE — Telephone Encounter (Signed)
Returned the call to the patient. He stated that he needs his forms filled out. He has been advised that we will work ion this for him.

## 2022-01-25 NOTE — Telephone Encounter (Signed)
Call placed to the patient to inform him that we have not received any paperwork. He will get the forms to [place].

## 2022-02-05 ENCOUNTER — Telehealth: Payer: Self-pay

## 2022-02-05 ENCOUNTER — Other Ambulatory Visit (HOSPITAL_COMMUNITY): Payer: Self-pay

## 2022-02-05 NOTE — Telephone Encounter (Signed)
Pharmacy Patient Advocate Encounter  Prior Authorization for TRESIBA 100U/ML  has been approved.    Effective dates: 01/22/22 through 02/05/23  Karie Soda, [place] Patient Advocate Specialist Direct Number: [phone] Fax: [phone]

## 2022-02-05 NOTE — Telephone Encounter (Signed)
Pharmacy Patient Advocate Encounter   Received notification from [place] that prior authorization for TRESIBA 100U/ML is due for renewal.   PA submitted on 02/05/22 Key [id number] Status is pending    Karie Soda, [place] Patient Advocate Specialist Direct Number: [phone] Fax: [phone]

## 2022-02-13 ENCOUNTER — Other Ambulatory Visit: Payer: Self-pay | Admitting: Cardiovascular Disease

## 2022-02-13 NOTE — Telephone Encounter (Signed)
Please contact pt for future appointment. Pt overdue for 6 month f/u.

## 2022-03-18 DIAGNOSIS — N184 Chronic kidney disease, stage 4 (severe): Secondary | ICD-10-CM | POA: Diagnosis not present

## 2022-03-18 DIAGNOSIS — I10 Essential (primary) hypertension: Secondary | ICD-10-CM | POA: Diagnosis not present

## 2022-03-18 DIAGNOSIS — E1122 Type 2 diabetes mellitus with diabetic chronic kidney disease: Secondary | ICD-10-CM | POA: Diagnosis not present

## 2022-03-20 ENCOUNTER — Encounter: Payer: Self-pay | Admitting: Nurse Practitioner

## 2022-03-20 ENCOUNTER — Ambulatory Visit: Payer: Medicare HMO | Attending: Nurse Practitioner | Admitting: Nurse Practitioner

## 2022-03-20 VITALS — BP 104/70 | HR 63 | Ht 66.0 in | Wt 226.4 lb

## 2022-03-20 DIAGNOSIS — Z720 Tobacco use: Secondary | ICD-10-CM

## 2022-03-20 DIAGNOSIS — G4733 Obstructive sleep apnea (adult) (pediatric): Secondary | ICD-10-CM

## 2022-03-20 DIAGNOSIS — I25118 Atherosclerotic heart disease of native coronary artery with other forms of angina pectoris: Secondary | ICD-10-CM | POA: Diagnosis not present

## 2022-03-20 DIAGNOSIS — E782 Mixed hyperlipidemia: Secondary | ICD-10-CM | POA: Diagnosis not present

## 2022-03-20 DIAGNOSIS — E1165 Type 2 diabetes mellitus with hyperglycemia: Secondary | ICD-10-CM | POA: Diagnosis not present

## 2022-03-20 DIAGNOSIS — R42 Dizziness and giddiness: Secondary | ICD-10-CM

## 2022-03-20 DIAGNOSIS — N1832 Chronic kidney disease, stage 3b: Secondary | ICD-10-CM

## 2022-03-20 DIAGNOSIS — I10 Essential (primary) hypertension: Secondary | ICD-10-CM

## 2022-03-20 MED ORDER — NITROGLYCERIN 0.4 MG SL SUBL: 0.4000 mg | SUBLINGUAL_TABLET | SUBLINGUAL | 3 refills | Status: DC | PRN

## 2022-03-20 NOTE — Patient Instructions (Signed)
Medication Instructions:  No changes at this time.   *If you need a refill on your cardiac medications before your next appointment, please call your pharmacy*   Lab Work: None  If you have labs (blood work) drawn today and your tests are completely normal, you will receive your results only by: [place] (if you have MyChart) OR A paper copy in the mail If you have any lab test that is abnormal or we need to change your treatment, we will call you to review the results.   Testing/Procedures: Your physician has requested that you have a carotid duplex. This test is an ultrasound of the carotid arteries in your neck. It looks at blood flow through these arteries that supply the brain with blood. Allow one hour for this exam. There are no restrictions or special instructions.    Follow-Up: At [facility], you and your health needs are our priority.  As part of our continuing mission to provide you with exceptional heart care, we have created designated Provider Care Teams.  These Care Teams include your primary Cardiologist (physician) and Advanced Practice Providers (APPs -  Physician Assistants and Nurse Practitioners) who all work together to provide you with the care you need, when you need it.   Your next appointment:   6 month(s)  Provider:   Kathlyn [place], MD or Murray Hodgkins, NP

## 2022-03-20 NOTE — Progress Notes (Signed)
Office Visit    Patient Name: [name] Date of Encounter: 03/20/2022  Primary Care Provider:  Wayland Denis, PA-C Primary Cardiologist:  Jeff Sacramento, MD  Chief Complaint    62 year old male with history of CAD status post LAD and RCA stenting, hypertension, hyperlipidemia, obesity, sleep apnea, tobacco abuse, uncontrolled type 2 diabetes mellitus, and GERD, who presents for follow-up related to stable angina.  Past Medical History    Past Medical History:  Diagnosis Date   Bilateral hip pain    a. 05/2021 ABIs: nl bilaterally.   CKD (chronic kidney disease), stage III (HCC)    Coronary artery disease    a.  H/o NSTEMI s/p LAD/RCA stenting; b. 08/2019 PCI: RCA 99p (2.5x15 Resolute Onyx DES), 75p/m, mild ISR; c. 09/2019 PCI: 85p - heavily calcified (intravascular lithotripsy & 3.0x26 Resolute Onyx DES); d. 10/18/19 Relook cath: stable anatomy->Med Rx;  e. 05/2021 Cath: LM 25, LAD 65mISR, 75/70/50d, RI small - 90, LCX 50p/m/d, OM3 40, RCA 30p ISR, 20p/m ISR, 60d, RPDA 70-->Med Rx.   Diabetes mellitus without complication (HCC)    Diastolic dysfunction    a. 08/2019 Echo: EF 50-55%, no rwma, Gr1 DD, nl RV size/fxn. Triv MR.   GERD (gastroesophageal reflux disease)    Hyperlipidemia    Hypertension    [place]I (non-ST elevated myocardial infarction) [place] [place]   Sleep apnea    Tobacco abuse    Past Surgical History:  Procedure Laterality Date   APPENDECTOMY     CARDIAC CATHETERIZATION     COLONOSCOPY     COLONOSCOPY WITH PROPOFOL N/A 09/16/2017   Procedure: COLONOSCOPY WITH PROPOFOL;  Surgeon: [name], TBenay Pike MD;  Location: [facility];  Service: Gastroenterology;  Laterality: N/A;  (+) DM - oral   CORONARY STENT INTERVENTION N/A 08/31/2019   Procedure: CORONARY STENT INTERVENTION;  Surgeon: CYolonda Kida MD;  [facility];  Service: Cardiovascular;  Laterality: N/A;   CORONARY STENT INTERVENTION  09/29/2019   CORONARY STENT INTERVENTION  N/A 09/29/2019   Procedure: CORONARY STENT INTERVENTION;  Surgeon: AWellington Hampshire MD;  [facility];  Service: Cardiovascular;  Laterality: N/A;   EXPLORATORY LAPAROTOMY     EYE SURGERY     HERNIA REPAIR     LEFT HEART CATH AND CORONARY ANGIOGRAPHY N/A 08/31/2019   Procedure: LEFT HEART CATH AND CORONARY ANGIOGRAPHY possible PCI and stent;  Surgeon: CYolonda Kida MD;  [facility];  Service: Cardiovascular;  Laterality: N/A;   LEFT HEART CATH AND CORONARY ANGIOGRAPHY Left 10/18/2019   Procedure: LEFT HEART CATH AND CORONARY ANGIOGRAPHY;  Surgeon: AWellington Hampshire MD;  [facility];  Service: Cardiovascular;  Laterality: Left;   LEFT HEART CATH AND CORONARY ANGIOGRAPHY Left 05/25/2021   Procedure: LEFT HEART CATH AND CORONARY ANGIOGRAPHY;  Surgeon: AWellington Hampshire MD;  [facility];  Service: Cardiovascular;  Laterality: Left;    Allergies  Allergies  Allergen Reactions   Shellfish Allergy Anaphylaxis   Penicillins Swelling    Childhood   Lisinopril Other (See Comments)    Hyperkalemia, increased creatinine   Erythromycin Rash and Other (See Comments)    Unknown    History of Present Illness    62year old male with above complex past medical history including CAD, hypertension, hyperlipidemia, obesity, sleep apnea, tobacco abuse, uncontrolled type 2 diabetes mellitus, and GERD.  He has a remote history of non-STEMI with stenting of the LAD and  mid RCA.  In July 2021, he presented with unstable angina and underwent diagnostic catheterization revealing patent LAD stent with mild restenosis.  There was an 80% stenosis in the distal LAD as well as an 80% stenosis in the ramus branch, with moderate left circumflex disease.  The RCA has severe proximal and mid disease with significant in-stent restenosis in the midsegment.  There is also moderate distal RCA stenosis and a 60 to 70% ostial RPDA stenosis.  The  interventional team was only able to place a stent proximally and were not able to advance a balloon to the mid segment stenosis.  In the setting of ongoing angina, he was referred to [name] and underwent catheterization August 2021 with intravascular lithotripsy and drug-eluting stent placement to the proximal right coronary artery.  He continued to have angina following the procedure and underwent repeat catheterization a few weeks later, which showed no significant change in coronary anatomy and antianginal therapy was adjusted.  In the setting of ongoing progressive exertional chest discomfort in the spring 2023, he underwent diagnostic catheterization showing patent LAD and RCA stents with mild in-stent restenosis along with stable, diffuse moderate left circumflex disease, significant small vessel disease involving the distal LAD, ramus intermedius, and RPDA.  There were no targets for revascularization and recommendation was made for continued aggressive medical therapy.  [name] was last seen in cardiology clinic in April 2023, at which time he had stable symptoms.  He was still smoking 1 pack/day.  He reported ongoing hypotension and dizziness and isosorbide mononitrate dosing was reduced to 30 mg daily.  Since his last visit, he reports stable angina.  This typically occurs only at higher levels of activity.  He has no trouble walking on flat surfaces, provided that he can walk at a comfortable pace, however, he notes that if he had to do any manual type of labor such as weed eating or push mowing the yard, he would certainly experience chest discomfort radiating to his left forearm, and dyspnea on exertion.  He feels that current doses of isosorbide and Ranexa have really helped to limit his symptoms.  He still occasionally notes orthostatic lightheadedness though overall this is stable and he can avoid symptoms by being more vigilant when getting up.  Today, he also reports that if he hyperextends  his neck for any period of time, that he experiences dizziness.  This is a newer symptom for him.  He denies palpitations, PND, orthopnea, syncope, edema, or early satiety.  He is still smoking 6 to 7 cigarettes/day and notes that his fiance has been on him lately about quitting.  Home Medications    Current Outpatient Medications  Medication Sig Dispense Refill   Accu-Chek FastClix Lancets MISC Use as instructed to check blood sugar up to 2 times daily 102 each 5   ACCU-CHEK GUIDE test strip USE AS INSTRUCTED TO CHECK BLOOD SUGAR UP TO 2 TIMES DAILY 100 strip 5   Accu-Chek Softclix Lancets lancets Use to check blood sugars 2 times daily 100 each 12   aspirin EC 81 MG tablet Take 81 mg by mouth daily.     atorvastatin (LIPITOR) 80 MG tablet Take 80 mg by mouth daily.     B-D UF III MINI PEN NEEDLES 31G X 5 MM MISC SMARTSIG:injection Daily     Blood Glucose Monitoring Suppl (ACCU-CHEK GUIDE ME) w/Device KIT Use as instructed to check blood sugar up to 2 times daily 1 kit 0   clopidogrel (PLAVIX)  75 MG tablet TAKE 1 TABLET BY MOUTH EVERY DAY 90 tablet 1   Continuous Blood Gluc Sensor (DEXCOM G7 SENSOR) MISC 1 Device by Does not apply route as directed. 9 each 3   cyclobenzaprine (FLEXERIL) 5 MG tablet Take 1 tablet (5 mg total) by mouth 3 (three) times daily as needed for muscle spasms. 30 tablet 0   dapagliflozin propanediol (FARXIGA) 10 MG TABS tablet Take 1 tablet (10 mg total) by mouth daily before breakfast. 90 tablet 3   EPINEPHrine 0.3 mg/0.3 mL IJ SOAJ injection Inject 0.3 mg into the muscle as needed for anaphylaxis.     famotidine (PEPCID) 20 MG tablet Take 20 mg by mouth daily.     fenofibrate (TRICOR) 145 MG tablet TAKE 1 TABLET BY MOUTH EVERY DAY 90 tablet 3   glucose blood (ACCU-CHEK GUIDE) test strip 1 each by Other route in the morning and at bedtime. Use as instructed 200 each 3   insulin degludec (TRESIBA FLEXTOUCH) 100 UNIT/ML FlexTouch Pen Inject 32 Units into the skin daily.  30 mL 4   Insulin Pen Needle (B-D UF III MINI PEN NEEDLES) 31G X 5 MM MISC Inject 1 Device into the skin daily in the afternoon. 100 each 3   isosorbide mononitrate (IMDUR) 30 MG 24 hr tablet Take 1 tablet (30 mg total) by mouth daily. 90 tablet 2   lisinopril (ZESTRIL) 2.5 MG tablet TAKE 1 TABLET BY MOUTH EVERY DAY 90 tablet 1   metoprolol succinate (TOPROL XL) 25 MG 24 hr tablet Take 0.5 tablets (12.5 mg total) by mouth daily. 45 tablet 2   Multiple Vitamins-Minerals (ZINC PO) Take 1 capsule by mouth daily.     Omega-3 Fatty Acids (FISH OIL) 1000 MG CAPS Take 1,000 mg by mouth daily.     pioglitazone (ACTOS) 45 MG tablet Take 1 tablet (45 mg total) by mouth daily. 90 tablet 3   ranolazine (RANEXA) 500 MG 12 hr tablet TAKE 1 TABLET BY MOUTH TWICE A DAY 180 tablet 3   vitamin B-12 (CYANOCOBALAMIN) 1000 MCG tablet Take 1,000 mcg by mouth daily.     erythromycin ophthalmic ointment every night. (Patient not taking: Reported on 03/20/2022)     nitroGLYCERIN (NITROSTAT) 0.4 MG SL tablet Place 1 tablet (0.4 mg total) under the tongue every 5 (five) minutes as needed for chest pain. 25 tablet 3   Semaglutide,0.25 or 0.'5MG'$ /DOS, 2 MG/3ML SOPN Inject 0.25 mg into the skin once a week. (Patient not taking: Reported on 03/20/2022) 6 mL 3   No current facility-administered medications for this visit.     Review of Systems    Chronic, stable angina and dyspnea.  Relatively manageable orthostatic lightheadedness but also some dizziness with prolonged hyperextension of the neck.  Denies palpitations, PND, orthopnea, syncope, edema, or early satiety.  All other systems reviewed and are otherwise negative except as noted above.    Physical Exam    VS:  BP 104/70 (BP Location: Left Arm, Patient Position: Sitting, Cuff Size: Normal)   Pulse 63   Ht '5\' 6"'$  (1.676 m)   Wt 226 lb 6.4 oz (102.7 kg)   SpO2 98%   BMI 36.54 kg/m  , BMI Body mass index is 36.54 kg/m.     GEN: Obese, in no acute distress. HEENT:  normal. Neck: Supple, no JVD, carotid bruits, or masses. Cardiac: RRR, no murmurs, rubs, or gallops. No clubbing, cyanosis, edema.  Radials 2+/PT 2+ and equal bilaterally.  Respiratory:  Respirations regular and unlabored,  clear to auscultation bilaterally. GI: Obese, soft, nontender, nondistended, BS + x 4. MS: no deformity or atrophy. Skin: warm and dry, no rash. Neuro:  Strength and sensation are intact. Psych: Normal affect.  Accessory Clinical Findings    ECG personally reviewed by me today -regular sinus rhythm, 63, first-degree AV block, left axis deviation, borderline left atrial enlargement, PVC- no acute changes.  Lab Results  Component Value Date   ALT 20 07/07/2014   AST 22 07/07/2014   ALKPHOS 51 07/07/2014   BILITOT 0.4 07/07/2014   Lab Results  Component Value Date   CHOL 148 01/02/2022   HDL 40.80 01/02/2022   LDLCALC 71 01/02/2022   LDLDIRECT 94.0 11/20/2020   TRIG 177.0 (H) 01/02/2022   CHOLHDL 4 01/02/2022    Lab Results  Component Value Date   HGBA1C 7.6 (A) 01/02/2022   Labs from care everywhere dated March 18, 2022- [facility]  Hemoglobin 12.1, hematocrit 35.9, WBC 7.8, platelets 277 Sodium 137, potassium 5.6, chloride 107, CO2 23, BUN 43, creatinine 2.69, glucose 89  Assessment & Plan    1.  Coronary artery disease with stable angina: Status post prior LAD and RCA stenting with most recent catheterization in April 2023 showing stable anatomy including small vessel disease involving the distal LAD, ramus intermedius, and RPDA.  He has been managed medically with aspirin, statin, Plavix, fibrate, beta-blocker, nitrate, and ranolazine.  He notes chronic, stable angina occurring only at a very high levels of activity or prolonged activity (greater than 30 minutes).  Overall, he is pleased with his medical regimen and feels that has greatly improved his activity tolerance.  No changes today.  2.  Mixed hyperlipidemia: LDL of 71 in November with  total cholesterol 148 and triglycerides of 177.  In all, lipids improved.  Discussed LDL goal of less than 70 and would really like to see him work on diet and exercise with a goal of weight loss, as his weight has gone up 10 pounds in the past year.  Agreed to defer adding ezetimibe given concurrent statin and Tricor therapy at this time with increased risk of side effects adding a third medication.  Strongly encouraged him to consider as close to a plant-based whole food diet as he is comfortable with.  3.  Hypertension/orthostatic hypotension: Stable with controllable orthostatic symptoms provided that he is vigilant when changing positions.  4.  Dizziness: Today, he reports a new symptom, which is dizziness when he extends his neck for prolonged periods of time.  No bruits on exam.  Will obtain a carotid ultrasound.  5.  Tobacco use: Continues to smoke 6 to 7 cigarettes/day.  He says he has been cutting back but he reported similar use a year ago.  Discussed the importance of smoking cessation as well as potentially using nicotine replacement and Chantix or Wellbutrin.  He says his fiance has really been on him about quitting smoking and he has not committed to a quit date at this time.  He will let us know if he is interested in pharmacologic options.  6.  Uncontrolled type 2 diabetes mellitus: Continues to improve with an A1c of 7.6 in November.  Management per primary care.  7.  PVCs: Only 1 PVC on EKG today.  Continue beta-blocker therapy.  8.  Stage IV chronic kidney disease: Creatinine 2.69 by nephrology labs on January 29.  9.  Hyperkalemia: Had labs drawn with nephrology on January 29-potassium 5.6.  Defer management to nephrology team.  10.  Bilateral hip pain: Normal ABIs in 2023.  11.  Disposition: Follow-up carotid ultrasound.  Follow-up in clinic in 6 months or sooner if necessary.  Murray Hodgkins, NP 03/20/2022, 9:28 AM

## 2022-03-23 ENCOUNTER — Emergency Department: Payer: Medicare HMO

## 2022-03-23 ENCOUNTER — Emergency Department
Admission: EM | Admit: 2022-03-23 | Discharge: 2022-03-23 | Disposition: A | Discharge status: Home / Self Care | Payer: Medicare HMO | Attending: Emergency Medicine | Admitting: Emergency Medicine

## 2022-03-23 ENCOUNTER — Other Ambulatory Visit: Payer: Self-pay

## 2022-03-23 DIAGNOSIS — R748 Abnormal levels of other serum enzymes: Secondary | ICD-10-CM | POA: Diagnosis not present

## 2022-03-23 DIAGNOSIS — R072 Precordial pain: Secondary | ICD-10-CM | POA: Insufficient documentation

## 2022-03-23 DIAGNOSIS — N189 Chronic kidney disease, unspecified: Secondary | ICD-10-CM | POA: Diagnosis not present

## 2022-03-23 DIAGNOSIS — I219 Acute myocardial infarction, unspecified: Secondary | ICD-10-CM | POA: Insufficient documentation

## 2022-03-23 DIAGNOSIS — I129 Hypertensive chronic kidney disease with stage 1 through stage 4 chronic kidney disease, or unspecified chronic kidney disease: Secondary | ICD-10-CM | POA: Diagnosis not present

## 2022-03-23 DIAGNOSIS — Z1152 Encounter for screening for COVID-19: Secondary | ICD-10-CM | POA: Diagnosis not present

## 2022-03-23 DIAGNOSIS — R079 Chest pain, unspecified: Secondary | ICD-10-CM

## 2022-03-23 DIAGNOSIS — R058 Other specified cough: Secondary | ICD-10-CM | POA: Insufficient documentation

## 2022-03-23 DIAGNOSIS — E1122 Type 2 diabetes mellitus with diabetic chronic kidney disease: Secondary | ICD-10-CM | POA: Diagnosis not present

## 2022-03-23 DIAGNOSIS — I2510 Atherosclerotic heart disease of native coronary artery without angina pectoris: Secondary | ICD-10-CM | POA: Diagnosis not present

## 2022-03-23 DIAGNOSIS — R0789 Other chest pain: Secondary | ICD-10-CM | POA: Diagnosis not present

## 2022-03-23 LAB — COMPREHENSIVE METABOLIC PANEL
ALT: 17 U/L (ref 0–44)
AST: 23 U/L (ref 15–41)
Albumin: 3.8 g/dL (ref 3.5–5.0)
Alkaline Phosphatase: 33 U/L — ABNORMAL LOW (ref 38–126)
Anion gap: 7 (ref 5–15)
BUN: 40 mg/dL — ABNORMAL HIGH (ref 8–23)
CO2: 20 mmol/L — ABNORMAL LOW (ref 22–32)
Calcium: 8.8 mg/dL — ABNORMAL LOW (ref 8.9–10.3)
Chloride: 107 mmol/L (ref 98–111)
Creatinine, Ser: 2.41 mg/dL — ABNORMAL HIGH (ref 0.61–1.24)
GFR, Estimated: 29 mL/min — ABNORMAL LOW (ref >60)
Glucose, Bld: 139 mg/dL — ABNORMAL HIGH (ref 70–99)
Potassium: 4.4 mmol/L (ref 3.5–5.1)
Sodium: 134 mmol/L — ABNORMAL LOW (ref 135–145)
Total Bilirubin: 0.6 mg/dL (ref 0.3–1.2)
Total Protein: 7.4 g/dL (ref 6.5–8.1)

## 2022-03-23 LAB — CBC WITH DIFFERENTIAL/PLATELET
Abs Immature Granulocytes: 0.06 10*3/uL (ref 0.00–0.07)
Basophils Absolute: 0.1 10*3/uL (ref 0.0–0.1)
Basophils Relative: 1 %
Eosinophils Absolute: 0.5 10*3/uL (ref 0.0–0.5)
Eosinophils Relative: 6 %
HCT: 36.9 % — ABNORMAL LOW (ref 39.0–52.0)
Hemoglobin: 11.9 g/dL — ABNORMAL LOW (ref 13.0–17.0)
Immature Granulocytes: 1 %
Lymphocytes Relative: 28 %
Lymphs Abs: 2.5 10*3/uL (ref 0.7–4.0)
MCH: 30.6 pg (ref 26.0–34.0)
MCHC: 32.2 g/dL (ref 30.0–36.0)
MCV: 94.9 fL (ref 80.0–100.0)
Monocytes Absolute: 0.9 10*3/uL (ref 0.1–1.0)
Monocytes Relative: 10 %
Neutro Abs: 4.8 10*3/uL (ref 1.7–7.7)
Neutrophils Relative %: 54 %
Platelets: 260 10*3/uL (ref 150–400)
RBC: 3.89 MIL/uL — ABNORMAL LOW (ref 4.22–5.81)
RDW: 14.6 % (ref 11.5–15.5)
WBC: 8.8 10*3/uL (ref 4.0–10.5)
nRBC: 0 % (ref 0.0–0.2)

## 2022-03-23 LAB — TROPONIN I (HIGH SENSITIVITY)
Troponin I (High Sensitivity): 3 ng/L (ref <18)
Troponin I (High Sensitivity): 4 ng/L (ref <18)

## 2022-03-23 LAB — BRAIN NATRIURETIC PEPTIDE: B Natriuretic Peptide: 62.9 pg/mL (ref 0.0–100.0)

## 2022-03-23 LAB — RESP PANEL BY RT-PCR (RSV, FLU A&B, COVID)  RVPGX2
Influenza A by PCR: NEGATIVE
Influenza B by PCR: NEGATIVE
Resp Syncytial Virus by PCR: NEGATIVE
SARS Coronavirus 2 by RT PCR: NEGATIVE

## 2022-03-23 LAB — LIPASE, BLOOD: Lipase: 117 U/L — ABNORMAL HIGH (ref 11–51)

## 2022-03-23 MED ORDER — FAMOTIDINE 20 MG PO TABS
20.0000 mg | ORAL_TABLET | Freq: Once | ORAL | Status: AC
  Administered 2022-03-23: 20 mg via ORAL
  Filled 2022-03-23: qty 1

## 2022-03-23 MED ORDER — ALUM & MAG HYDROXIDE-SIMETH 200-200-20 MG/5ML PO SUSP
30.0000 mL | Freq: Once | ORAL | Status: AC
  Administered 2022-03-23: 30 mL via ORAL
  Filled 2022-03-23: qty 30

## 2022-03-23 MED ORDER — SODIUM CHLORIDE 0.9 % IV BOLUS
500.0000 mL | Freq: Once | INTRAVENOUS | Status: AC
  Administered 2022-03-23: 500 mL via INTRAVENOUS

## 2022-03-23 MED ORDER — ACETAMINOPHEN 500 MG PO TABS
1000.0000 mg | ORAL_TABLET | Freq: Once | ORAL | Status: AC
  Administered 2022-03-23: 1000 mg via ORAL
  Filled 2022-03-23: qty 2

## 2022-03-23 NOTE — ED Notes (Signed)
Pt verbalized understanding of discharge instructions.

## 2022-03-23 NOTE — ED Provider Notes (Signed)
[name] Provider Note    Event Date/Time   First MD Initiated Contact with Patient 03/23/22 1617     (approximate)   History   Chest Pain   HPI  [name] is a 62 y.o. male   Past medical history of CAD, CKD, diabetes, hyperlipidemia and hypertension, who presents to the emergency department with substernal chest pain occurring at rest reminiscent of his prior MI.  Over this past week he has had mild cough/cold symptoms nasal congestion no fever no shortness of breath, but reports no other acute medical illnesses or complaints and no recent chest pain even with exertion.  He was in his car driving today when he had sudden onset substernal pain nonradiating without associated nausea, diaphoresis or shortness of breath.  Per EMS he had a full dose of aspirin as well as 2 nitroglycerin after which his pain got markedly improved.  Independent Historian contributed to assessment above: ems  External Medical Documents Reviewed: Cardiac catheterization from April 2023 with heavily calcified coronary arteries but no targets for revascularization, recommendation for continued medical management.      Physical Exam   Triage Vital Signs: ED Triage Vitals  Enc Vitals Group     BP      Pulse      Resp      Temp      Temp src      SpO2      Weight      Height      Head Circumference      Peak Flow      Pain Score      Pain Loc      Pain Edu?      Excl. in [place]?     Most recent vital signs: Vitals:   03/23/22 1621 03/23/22 1627  BP: 136/69   Pulse: 66   Resp: 12   Temp:  97.8 F (36.6 C)  SpO2: 100%     General: Awake, no distress.  CV:  Good peripheral perfusion.  Resp:  Normal effort.  Abd:  No distention.  Other:  Awake alert oriented comfortable appearing no respiratory distress, lungs clear to auscultation without focality rales or wheezing, abdomen soft and nontender radial pulses palpable and equal bilaterally, appears  euvolemic.   ED Results / Procedures / Treatments   Labs (all labs ordered are listed, but only abnormal results are displayed) Labs Reviewed  COMPREHENSIVE METABOLIC PANEL - Abnormal; Notable for the following components:      Result Value   Sodium 134 (*)    CO2 20 (*)    Glucose, Bld 139 (*)    BUN 40 (*)    Creatinine, Ser 2.41 (*)    Calcium 8.8 (*)    Alkaline Phosphatase 33 (*)    GFR, Estimated 29 (*)    All other components within normal limits  LIPASE, BLOOD - Abnormal; Notable for the following components:   Lipase 117 (*)    All other components within normal limits  CBC WITH DIFFERENTIAL/PLATELET - Abnormal; Notable for the following components:   RBC 3.89 (*)    Hemoglobin 11.9 (*)    HCT 36.9 (*)    All other components within normal limits  RESP PANEL BY RT-PCR (RSV, FLU A&B, COVID)  RVPGX2  BRAIN NATRIURETIC PEPTIDE  TROPONIN I (HIGH SENSITIVITY)  TROPONIN I (HIGH SENSITIVITY)     I ordered and reviewed the above labs they are notable for he has  a creatinine of 2.4 from 2.8, initial troponin of 3 and a proBNP of 63, hemoglobin is 11.9 from fourteen 1 year ago but has been as low as 12's in years prior, normocytic.  EKG  ED ECG REPORT I, Lucillie Garfinkel, the attending physician, personally viewed and interpreted this ECG.   Date: 03/23/2022  EKG Time: 1634  Rate: 66  Rhythm: nsr  Axis: nl  Intervals:incomplete lbbb   ST&T Change: Ischemic changes, no STEMI    RADIOLOGY I independently reviewed and interpreted chest x-ray see no obvious focal opacities or pneumothorax   PROCEDURES:  Critical Care performed: No  Procedures   MEDICATIONS ORDERED IN ED: Medications  sodium chloride 0.9 % bolus 500 mL (500 mLs Intravenous New Bag/Given 03/23/22 1753)  acetaminophen (TYLENOL) tablet 1,000 mg (1,000 mg Oral Given 03/23/22 1723)  alum & mag hydroxide-simeth (MAALOX/MYLANTA) 200-200-20 MG/5ML suspension 30 mL (30 mLs Oral Given 03/23/22 1723)  famotidine  (PEPCID) tablet 20 mg (20 mg Oral Given 03/23/22 1723)     IMPRESSION / MDM / ASSESSMENT AND PLAN / ED COURSE  I reviewed the triage vital signs and the nursing notes.                                Patient's presentation is most consistent with acute presentation with potential threat to life or bodily function.  Differential diagnosis includes, but is not limited to, ACS, dissection, PE, musculoskeletal pain, gastritis/GERD/ulceration, pancreatitis, biliary   The patient is on the cardiac monitor to evaluate for evidence of arrhythmia and/or significant heart rate changes.  MDM: Patient with extensive cardiac history with chest pain concerning for ACS fortunately EKG is nonischemic and first troponin is negative.  Abdomen is soft and nontender I doubt intra-abdominal pathology, will trial GI cocktail, patient is stable, will trend serial troponin.  Lipase was mildly elevated in the 100, LFTs within normal limits and every assessment of the abdomen was soft and nontender deep palpation all quadrants.  He is not a drinker.  Has had no abdominal pain or postprandial symptoms.  I doubt biliary/complicated pancreatitis.  I considered hospitalization for admission or observation but since pain was resolved, patient asymptomatic, and workup as above including serial troponins have been flat and negative nonischemic EKG, I think that outpatient monitoring and outpatient follow-up is most appropriate this time and patient is in agreement.        FINAL CLINICAL IMPRESSION(S) / ED DIAGNOSES   Final diagnoses:  Chest pain, unspecified type  Elevated lipase     Rx / DC Orders   ED Discharge Orders     None        Note:  This document was prepared using Dragon voice recognition software and may include unintentional dictation errors.    Lucillie Garfinkel, MD 03/23/22 [phone]

## 2022-03-23 NOTE — Discharge Instructions (Signed)
You had a mildly elevated lipase which is a marker for inflammation in the pancreas.  The remainder of your lab tests were normal and your pain went away in the emergency department, please follow-up with your regular doctor this week for recheck of your blood tests and follow-up visit.  Thank you for choosing [place] for your health care today!  Please see your primary doctor this week for a follow up appointment.   Sometimes, in the early stages of certain disease courses it is difficult to detect in the emergency department evaluation -- so, it is important that you continue to monitor your symptoms and call your doctor right away or return to the emergency department if you develop any new or worsening symptoms.  Please go to the following website to schedule new (and existing) patient appointments:   [email or web address]  If you do not have a primary doctor try calling the following clinics to establish care:  If you have insurance:  [facility] [phone] [place] [address]   [facility]  [phone] [place] [address]   If you do not have insurance:  [facility]  [phone] [address] [place] [address]   The following is another list of primary care offices in the area who are accepting new patients at this time.  Please reach out to one of them directly and let them know you would like to schedule an appointment to follow up on an Emergency Department visit, and/or to establish a new primary care provider (PCP).  There are likely other primary care clinics in the are who are accepting new patients, but this is an excellent place to start:  [place] physician: Dr Lavon Paganini [address] [place], [place] [address] [phone]  [facility] Lead Physician: Dr Steele Sizer [address],  [place], [place] [address] [phone]  [place] Physician: Dr Park Liter [address] [place], [place] [address] [phone]  [facility] Lead Physician: Dr Dewaine Oats [place], [place], [place] [address] [phone]  [facility] Physician: Dr Halina Maidens [address] Broach [place], [place] [address] [phone]   It was my pleasure to care for you today.   Hoover Brunette Jacelyn Grip, MD

## 2022-03-23 NOTE — ED Triage Notes (Signed)
Pt to ED from home via [facility]. C/O chest pain. Pt took 2 nitro and 324 mg aspirin prior to EMS arrival. Pt has hx of MI, stents. C/O dizziness.

## 2022-03-27 DIAGNOSIS — E1159 Type 2 diabetes mellitus with other circulatory complications: Secondary | ICD-10-CM | POA: Diagnosis not present

## 2022-03-27 DIAGNOSIS — E1122 Type 2 diabetes mellitus with diabetic chronic kidney disease: Secondary | ICD-10-CM | POA: Diagnosis not present

## 2022-03-27 DIAGNOSIS — I152 Hypertension secondary to endocrine disorders: Secondary | ICD-10-CM | POA: Diagnosis not present

## 2022-03-27 DIAGNOSIS — Z720 Tobacco use: Secondary | ICD-10-CM | POA: Diagnosis not present

## 2022-03-27 DIAGNOSIS — Z125 Encounter for screening for malignant neoplasm of prostate: Secondary | ICD-10-CM | POA: Diagnosis not present

## 2022-03-27 DIAGNOSIS — Z794 Long term (current) use of insulin: Secondary | ICD-10-CM | POA: Diagnosis not present

## 2022-03-27 DIAGNOSIS — E785 Hyperlipidemia, unspecified: Secondary | ICD-10-CM | POA: Diagnosis not present

## 2022-03-27 DIAGNOSIS — E1169 Type 2 diabetes mellitus with other specified complication: Secondary | ICD-10-CM | POA: Diagnosis not present

## 2022-03-27 DIAGNOSIS — N184 Chronic kidney disease, stage 4 (severe): Secondary | ICD-10-CM | POA: Diagnosis not present

## 2022-04-03 DIAGNOSIS — E1122 Type 2 diabetes mellitus with diabetic chronic kidney disease: Secondary | ICD-10-CM | POA: Diagnosis not present

## 2022-04-03 DIAGNOSIS — Z0000 Encounter for general adult medical examination without abnormal findings: Secondary | ICD-10-CM | POA: Diagnosis not present

## 2022-04-03 DIAGNOSIS — N189 Chronic kidney disease, unspecified: Secondary | ICD-10-CM | POA: Diagnosis not present

## 2022-04-03 DIAGNOSIS — F17210 Nicotine dependence, cigarettes, uncomplicated: Secondary | ICD-10-CM | POA: Diagnosis not present

## 2022-04-03 DIAGNOSIS — I129 Hypertensive chronic kidney disease with stage 1 through stage 4 chronic kidney disease, or unspecified chronic kidney disease: Secondary | ICD-10-CM | POA: Diagnosis not present

## 2022-04-03 DIAGNOSIS — Z794 Long term (current) use of insulin: Secondary | ICD-10-CM | POA: Diagnosis not present

## 2022-04-04 ENCOUNTER — Telehealth: Payer: Self-pay | Admitting: Cardiovascular Disease

## 2022-04-04 NOTE — Telephone Encounter (Signed)
Returned the call to the patient. He stated that he dropped off the paperwork at his last office visit. He has been advised that regrettably I did not receive any paperwork and to see if he can get another copy. He stated that he would try and call me back.

## 2022-04-04 NOTE — Telephone Encounter (Signed)
Pt states he left [name], RN some paperwork for his insurance at his last appt and he's calling to check the status of it's completion. Please advise.

## 2022-04-09 ENCOUNTER — Telehealth: Payer: Self-pay | Admitting: Cardiovascular Disease

## 2022-04-09 NOTE — Telephone Encounter (Signed)
Patient dropped off paperwork placed in box.

## 2022-04-09 NOTE — Telephone Encounter (Signed)
The patient called to  ask that the paperwork be faxed to [phone].  Paperwork has been received. The patient stated that the paperwork should say "Unable to work due to permanent disability". He said he has not worked in three years and is currently on disability.

## 2022-04-09 NOTE — Telephone Encounter (Signed)
Patient is calling needing to update the fax number the paperwork he dropped off is to be sent to. He is also requesting to speak with [name] regarding certain information that needs to be added to it. Please advise.

## 2022-04-11 NOTE — Telephone Encounter (Signed)
The patient has been called back and made aware that the form he dropped off was his portion to fill out. He will get the correct form and bring it up here.

## 2022-04-11 NOTE — Telephone Encounter (Signed)
I do not have any paperwork in my in basket.  Am I supposed to do anything?

## 2022-04-15 ENCOUNTER — Other Ambulatory Visit: Payer: Self-pay | Admitting: Family Medicine

## 2022-04-15 DIAGNOSIS — F17210 Nicotine dependence, cigarettes, uncomplicated: Secondary | ICD-10-CM

## 2022-04-26 ENCOUNTER — Ambulatory Visit
Admission: RE | Admit: 2022-04-26 | Discharge: 2022-04-26 | Disposition: A | Discharge status: Home / Self Care | Payer: Medicare HMO | Source: Ambulatory Visit | Attending: Family Medicine | Admitting: Family Medicine

## 2022-04-26 DIAGNOSIS — F17210 Nicotine dependence, cigarettes, uncomplicated: Secondary | ICD-10-CM | POA: Diagnosis not present

## 2022-05-09 ENCOUNTER — Other Ambulatory Visit: Payer: Self-pay | Admitting: Nurse Practitioner

## 2022-05-09 DIAGNOSIS — Z720 Tobacco use: Secondary | ICD-10-CM

## 2022-05-09 DIAGNOSIS — I10 Essential (primary) hypertension: Secondary | ICD-10-CM

## 2022-05-09 DIAGNOSIS — I25118 Atherosclerotic heart disease of native coronary artery with other forms of angina pectoris: Secondary | ICD-10-CM

## 2022-05-09 DIAGNOSIS — E782 Mixed hyperlipidemia: Secondary | ICD-10-CM

## 2022-05-09 DIAGNOSIS — E1165 Type 2 diabetes mellitus with hyperglycemia: Secondary | ICD-10-CM

## 2022-05-09 DIAGNOSIS — N1832 Chronic kidney disease, stage 3b: Secondary | ICD-10-CM

## 2022-05-09 DIAGNOSIS — G4733 Obstructive sleep apnea (adult) (pediatric): Secondary | ICD-10-CM

## 2022-05-09 DIAGNOSIS — R42 Dizziness and giddiness: Secondary | ICD-10-CM

## 2022-05-10 ENCOUNTER — Encounter: Payer: Self-pay | Admitting: Internal Medicine

## 2022-05-10 ENCOUNTER — Ambulatory Visit (INDEPENDENT_AMBULATORY_CARE_PROVIDER_SITE_OTHER): Payer: Medicare HMO | Admitting: Internal Medicine

## 2022-05-10 VITALS — BP 108/72 | HR 74 | Ht 66.0 in | Wt 223.8 lb

## 2022-05-10 DIAGNOSIS — N184 Chronic kidney disease, stage 4 (severe): Secondary | ICD-10-CM

## 2022-05-10 DIAGNOSIS — Z794 Long term (current) use of insulin: Secondary | ICD-10-CM

## 2022-05-10 DIAGNOSIS — E782 Mixed hyperlipidemia: Secondary | ICD-10-CM

## 2022-05-10 DIAGNOSIS — E1159 Type 2 diabetes mellitus with other circulatory complications: Secondary | ICD-10-CM | POA: Diagnosis not present

## 2022-05-10 DIAGNOSIS — E1122 Type 2 diabetes mellitus with diabetic chronic kidney disease: Secondary | ICD-10-CM | POA: Diagnosis not present

## 2022-05-10 LAB — POCT GLYCOSYLATED HEMOGLOBIN (HGB A1C): Hemoglobin A1C: 6.3 % — AB (ref 4.0–5.6)

## 2022-05-10 MED ORDER — FENOFIBRATE 145 MG PO TABS: 145.0000 mg | ORAL_TABLET | Freq: Every day | ORAL | 3 refills | Status: DC

## 2022-05-10 MED ORDER — DAPAGLIFLOZIN PROPANEDIOL 10 MG PO TABS: 10.0000 mg | ORAL_TABLET | Freq: Every day | ORAL | 3 refills | Status: DC

## 2022-05-10 MED ORDER — TRESIBA FLEXTOUCH 100 UNIT/ML ~~LOC~~ SOPN: 26.0000 [IU] | PEN_INJECTOR | Freq: Every day | SUBCUTANEOUS | 3 refills | Status: DC

## 2022-05-10 MED ORDER — GLIPIZIDE ER 2.5 MG PO TB24: 2.5000 mg | ORAL_TABLET | Freq: Every day | ORAL | 3 refills | Status: DC

## 2022-05-10 MED ORDER — ATORVASTATIN CALCIUM 80 MG PO TABS: 80.0000 mg | ORAL_TABLET | Freq: Every day | ORAL | 3 refills | Status: DC

## 2022-05-10 MED ORDER — PIOGLITAZONE HCL 45 MG PO TABS: 45.0000 mg | ORAL_TABLET | Freq: Every day | ORAL | 3 refills | Status: DC

## 2022-05-10 MED ORDER — BD PEN NEEDLE MINI U/F 31G X 5 MM MISC: 1.0000 | Freq: Every day | 3 refills | Status: DC

## 2022-05-10 NOTE — Patient Instructions (Addendum)
-   Continue Glipizide 2.5mg  XL, 1 tablet before Breakfast  - Continue Farxiga 10 mg daily in the morning  - Continue Pioglitazone 45 mg daily  - Decrease Tresiba 26 units daily     HOW TO TREAT LOW BLOOD SUGARS (Blood sugar LESS THAN 70 MG/DL) Please follow the RULE OF 15 for the treatment of hypoglycemia treatment (when your (blood sugars are less than 70 mg/dL)   STEP 1: Take 15 grams of carbohydrates when your blood sugar is low, which includes:  3-4 GLUCOSE TABS  OR 3-4 OZ OF JUICE OR REGULAR SODA OR ONE TUBE OF GLUCOSE GEL    STEP 2: RECHECK blood sugar in 15 MINUTES STEP 3: If your blood sugar is still low at the 15 minute recheck --> then, go back to STEP 1 and treat AGAIN with another 15 grams of carbohydrates.

## 2022-05-10 NOTE — Progress Notes (Signed)
Name: [name]  Age/ Sex: 62 y.o., male   MRN/ DOB: [id number], 1960/03/01     PCP: Juluis Pitch, MD   Reason for Endocrinology Evaluation: Type 2 Diabetes Mellitus  Initial Endocrine Consultative Visit: 11/03/2019    PATIENT IDENTIFIER: [name] is a 62 y.o. male with a past medical history of T2DM, CAD, OSA and HTN.  The patient has followed with Endocrinology clinic since 11/03/2019 for consultative assistance with management of his diabetes.  DIABETIC HISTORY:  [name] was diagnosed with DM many years ago. Trulicity has caused nausea. His hemoglobin A1c has ranged from 9.0% in 12/2019, peaking at 12.1% in 10/2019.  No personal hx of pancreatitis    On his initial visit to our clinic he had an A1c of 12.1 %. He was on metformin, Glipizide and pioglitazone. We increased Glipizide     By 12/2019 reduced metformin due to low GFG, stopped by October 2023 due to diarrhea and low GFR  Stopped Glipizide and started basal insulin 06/2020 as well as [place] Glade    Started Trulicity in May [id number] but was unable to tolerate due to abdominal pain and stopped by October 2023  He stopped metformin due to diarrhea 12/2022 Started Ozempic 12/2022 but did not tolerate   He was started on Glipizide XL 2.5 mg daily by his PCP which results in hypoglycemia as low as 59 mg/dL   SUBJECTIVE:   During the last visit (11/15/20223): A1c 7.6 %    Today (05/10/2022): [name] is here for a follow up on diabetes.  He is accompanied by his wife [name]. He checks his blood sugars occasionally . The patient has had hypoglycemic episodes since the last clinic visit.   Patient continues to follow-up with nephrology (Dr. Candiss Norse) 03/18/2022 He was started on Glipizide 2.5 mg XL by  Denies abdominal pain or diarrhea     HOME ENDOCRINE  REGIMEN:  Pioglitazone 45 mg daily  Farxiga 10 mg daily in the morning  Tresiba 32 units daily   Atorvastatin 80 mg daily Fenofibrate 145 mg daily       Statin: yes ACE-I/ARB: no   METER DOWNLOAD SUMMARY:Unable to download 59- 210 mg/dL         DIABETIC COMPLICATIONS: Microvascular complications:   Denies: Denies: CKD, retinopathy, neuropathy  Last Eye Exam: Completed 01/2021  Macrovascular complications:  CAD ( S/P PCI) Denies: CVA, PVD   HISTORY:  Past Medical History:  Past Medical History:  Diagnosis Date   Bilateral hip pain    a. 05/2021 ABIs: nl bilaterally.   CKD (chronic kidney disease), stage III (HCC)    Coronary artery disease    a.  H/o NSTEMI s/p LAD/RCA stenting; b. 08/2019 PCI: RCA 99p (2.5x15 Resolute Onyx DES), 75p/m, mild ISR; c. 09/2019 PCI: 85p - heavily calcified (intravascular lithotripsy & 3.0x26 Resolute Onyx DES); d. 10/18/19 Relook cath: stable anatomy->Med Rx;  e. 05/2021 Cath: LM 25, LAD 19m ISR, 75/70/50d, RI small - 90, LCX 50p/m/d, OM3 40, RCA 30p ISR, 20p/m ISR, 60d, RPDA 70-->Med Rx.   Diabetes mellitus without complication (HCC)    Diastolic dysfunction    a. 08/2019 Echo: EF 50-55%, no rwma, Gr1 DD, nl RV size/fxn. Triv MR.   GERD (gastroesophageal reflux disease)    Hyperlipidemia    Hypertension    NSTEMI (non-ST elevated myocardial infarction) ([place]) 08/2019   Sleep apnea    Tobacco abuse    Past Surgical History:  Past Surgical History:  Procedure Laterality  Date   APPENDECTOMY     CARDIAC CATHETERIZATION     COLONOSCOPY     COLONOSCOPY WITH PROPOFOL N/A 09/16/2017   Procedure: COLONOSCOPY WITH PROPOFOL;  Surgeon: Toledo, Benay Pike, MD;  Location: [facility];  Service: Gastroenterology;  Laterality: N/A;  (+) DM - oral   CORONARY STENT INTERVENTION N/A 08/31/2019   Procedure: CORONARY STENT INTERVENTION;  Surgeon: Yolonda Kida, MD;  Location: [place] [facility];  Service: Cardiovascular;  Laterality: N/A;   CORONARY STENT INTERVENTION  09/29/2019   CORONARY STENT INTERVENTION N/A 09/29/2019   Procedure: CORONARY STENT INTERVENTION;  Surgeon: Wellington Hampshire,  MD;  Location: [place] [facility];  Service: Cardiovascular;  Laterality: N/A;   EXPLORATORY LAPAROTOMY     EYE SURGERY     HERNIA REPAIR     LEFT HEART CATH AND CORONARY ANGIOGRAPHY N/A 08/31/2019   Procedure: LEFT HEART CATH AND CORONARY ANGIOGRAPHY possible PCI and stent;  Surgeon: Yolonda Kida, MD;  Location: [place] [facility];  Service: Cardiovascular;  Laterality: N/A;   LEFT HEART CATH AND CORONARY ANGIOGRAPHY Left 10/18/2019   Procedure: LEFT HEART CATH AND CORONARY ANGIOGRAPHY;  Surgeon: Wellington Hampshire, MD;  Location: [place] [facility];  Service: Cardiovascular;  Laterality: Left;   LEFT HEART CATH AND CORONARY ANGIOGRAPHY Left 05/25/2021   Procedure: LEFT HEART CATH AND CORONARY ANGIOGRAPHY;  Surgeon: Wellington Hampshire, MD;  Location: [place] [facility];  Service: Cardiovascular;  Laterality: Left;   Social History:  reports that he has been smoking cigarettes. He has a 12.50 pack-year smoking history. He has never used smokeless tobacco. He reports that he does not drink alcohol and does not use drugs. Family History:  Family History  Problem Relation Age of Onset   Heart disease Father    Heart attack Father    Hypertension Father    Diabetes Father      HOME MEDICATIONS: Allergies as of 05/10/2022       Reactions   Shellfish Allergy Anaphylaxis   Penicillins Swelling   Childhood   Lisinopril Other (See Comments)   Hyperkalemia, increased creatinine   Erythromycin Rash, Other (See Comments)   Unknown        Medication List        Accurate as of May 10, 2022  9:24 AM. If you have any questions, ask your nurse or doctor.          Accu-Chek FastClix Lancets Misc Use as instructed to check blood sugar up to 2 times daily   Accu-Chek Softclix Lancets lancets Use to check blood sugars 2 times daily   Accu-Chek Guide Me w/Device Kit Use as instructed to check blood sugar up to 2 times daily   Accu-Chek Guide test strip Generic drug: glucose  blood USE AS INSTRUCTED TO CHECK BLOOD SUGAR UP TO 2 TIMES DAILY   Accu-Chek Guide test strip Generic drug: glucose blood 1 each by Other route in the morning and at bedtime. Use as instructed   aspirin EC 81 MG tablet Take 81 mg by mouth daily.   atorvastatin 80 MG tablet Commonly known as: LIPITOR Take 80 mg by mouth daily.   B-D UF III MINI PEN NEEDLES 31G X 5 MM Misc Generic drug: Insulin Pen Needle SMARTSIG:injection Daily   B-D UF III MINI PEN NEEDLES 31G X 5 MM Misc Generic drug: Insulin Pen Needle Inject 1 Device into the skin daily in the afternoon.   clopidogrel 75 MG tablet Commonly known  as: PLAVIX TAKE 1 TABLET BY MOUTH EVERY DAY   cyanocobalamin 1000 MCG tablet Commonly known as: VITAMIN B12 Take 1,000 mcg by mouth daily.   cyclobenzaprine 5 MG tablet Commonly known as: FLEXERIL Take 1 tablet (5 mg total) by mouth 3 (three) times daily as needed for muscle spasms.   dapagliflozin propanediol 10 MG Tabs tablet Commonly known as: Farxiga Take 1 tablet (10 mg total) by mouth daily before breakfast.   Dexcom G7 Sensor Misc 1 Device by Does not apply route as directed.   EPINEPHrine 0.3 mg/0.3 mL Soaj injection Commonly known as: EPI-PEN Inject 0.3 mg into the muscle as needed for anaphylaxis.   erythromycin ophthalmic ointment   famotidine 20 MG tablet Commonly known as: PEPCID Take 20 mg by mouth daily.   fenofibrate 145 MG tablet Commonly known as: TRICOR TAKE 1 TABLET BY MOUTH EVERY DAY   Fish Oil 1000 MG Caps Take 1,000 mg by mouth daily.   glipiZIDE 2.5 MG 24 hr tablet Commonly known as: GLUCOTROL XL Take 2.5 mg by mouth daily with breakfast.   isosorbide mononitrate 30 MG 24 hr tablet Commonly known as: IMDUR Take 1 tablet (30 mg total) by mouth daily.   lisinopril 2.5 MG tablet Commonly known as: ZESTRIL TAKE 1 TABLET BY MOUTH EVERY DAY   metoprolol succinate 25 MG 24 hr tablet Commonly known as: Toprol XL Take 0.5 tablets (12.5  mg total) by mouth daily.   nitroGLYCERIN 0.4 MG SL tablet Commonly known as: NITROSTAT Place 1 tablet (0.4 mg total) under the tongue every 5 (five) minutes as needed for chest pain.   pioglitazone 45 MG tablet Commonly known as: ACTOS Take 1 tablet (45 mg total) by mouth daily.   ranolazine 500 MG 12 hr tablet Commonly known as: RANEXA TAKE 1 TABLET BY MOUTH TWICE A DAY   Semaglutide(0.25 or 0.5MG /DOS) 2 MG/3ML Sopn Inject 0.25 mg into the skin once a week.   Tyler Aas FlexTouch 100 UNIT/ML FlexTouch Pen Generic drug: insulin degludec Inject 32 Units into the skin daily.   ZINC PO Take 1 capsule by mouth daily.         OBJECTIVE:   Vital Signs: BP 108/72 (BP Location: Left Arm, Patient Position: Sitting, Cuff Size: Small)   Pulse 74   Ht 5\' 6"  (1.676 m)   Wt 223 lb 12.8 oz (101.5 kg)   SpO2 97%   BMI 36.12 kg/m   Wt Readings from Last 3 Encounters:  05/10/22 223 lb 12.8 oz (101.5 kg)  04/26/22 226 lb (102.5 kg)  03/20/22 226 lb 6.4 oz (102.7 kg)     Exam: General: Pt appears well and is in NAD  Lungs: Coarse breathing sounds   Heart: RRR   Extremities: No pretibial edema.  Neuro: MS is good with appropriate affect, pt is alert and Ox3       DM foot exam: 01/02/2022   The skin of the feet is intact without sores or ulcerations. The pedal pulses are 1+ on right and 1+ on left. The sensation is intact to a screening 5.07, 10 gram monofilament bilaterally    DATA REVIEWED:  Lab Results  Component Value Date   HGBA1C 6.3 (A) 05/10/2022   HGBA1C 7.6 (A) 01/02/2022   HGBA1C 8.6 (A) 07/05/2021  03/27/2022 Na 134 K5.3 BUN 39 Cr.  2.5 GFR 28 A1c 8.6% TG 151 HDL 34.8 LDL 63   ASSESSMENT / PLAN / RECOMMENDATIONS:   1) Type 2 Diabetes Mellitus, With CKD IV  and macrovascular  complications - Most recent A1c of 6.3  %. Goal A1c < 7.0.   -His A1c has trended down from 8.6% to 6.3% due to recurrent hypoglycemia as low as BG's in the 50s -We discussed  the fatal consequences of hypoglycemia, this is due to the combination of sulfonylurea with insulin -I will reduce his insulin dose as below -He is intolerant to metformin due to diarrhea -He is intolerant to Trulicity and Ozempic due to abdominal pain - Dexcom G7 was NOT covered by his insurance   MEDICATIONS:  - Continue glipizide 2.5 mg XL, 1 tablet before breakfast - Continue Farxiga 10 mg daily in the morning  - Continue Pioglitazone 45 mg daily  - Decrease Tresiba to 26 units daily   EDUCATION / INSTRUCTIONS: BG monitoring instructions: Patient is instructed to check his blood sugars 2 times a day, before breakfast and supper. Call [facility] if: BG persistently < 70  I reviewed the Rule of 15 for the treatment of hypoglycemia in detail with the patient. Literature supplied.     2) Diabetic complications:  Eye: Does not have known diabetic retinopathy.  Neuro/ Feet: Does not have known diabetic peripheral neuropathy .  Renal: Patient does not have known baseline CKD. He   is not a candidate for  ACEI/ARB at present due to hyperkalemia . MA/Cr ratio is normal    3) Mixed Dyslipidemia:    -TG improved from 935 in 11/2020 , to 177 mg/DL 12/2021 -LDL at goal  Medication Continue atorvastatin 80 mg daily Continue fenofibrate 145 mg daily   F/U in 4 months   Signed electronically by: Mack Guise, MD  [facility]  [address] [place], [place] [address] Phone: [phone] FAX: [phone]   CC: Juluis Pitch, MD [address] [place] [address] Phone: [phone]  Fax: [phone]  Return to Endocrinology clinic as below: Future Appointments  Date Time Provider [place]  05/15/2022 11:30 AM [facility] [place] [facility] None

## 2022-05-15 ENCOUNTER — Ambulatory Visit: Payer: Medicare HMO | Attending: Nurse Practitioner

## 2022-05-15 DIAGNOSIS — R42 Dizziness and giddiness: Secondary | ICD-10-CM | POA: Diagnosis not present

## 2022-05-16 ENCOUNTER — Telehealth: Payer: Self-pay

## 2022-05-16 ENCOUNTER — Encounter: Payer: Self-pay | Admitting: Internal Medicine

## 2022-05-16 MED ORDER — DEXCOM G7 SENSOR MISC: 3 refills | Status: DC

## 2022-05-16 NOTE — Telephone Encounter (Signed)
Script sent for [employer]

## 2022-05-21 ENCOUNTER — Telehealth: Payer: Self-pay | Admitting: *Deleted

## 2022-05-21 ENCOUNTER — Other Ambulatory Visit: Payer: Self-pay | Admitting: Nurse Practitioner

## 2022-05-21 ENCOUNTER — Other Ambulatory Visit: Payer: Self-pay | Admitting: Cardiovascular Disease

## 2022-05-21 NOTE — Telephone Encounter (Signed)
Spoke to verified taking Tresiba 26 units. Notified pt need to decrease  22 units for 1 week per Dr. Kelton Pillar and if any change give the office call back. Pt voiced understanding.

## 2022-05-21 NOTE — Telephone Encounter (Signed)
Last visit 03/20/22 no change in med.  F/u plan in 6 months no scheduled at this time

## 2022-05-21 NOTE — Telephone Encounter (Signed)
Pt called stated-Dexcom 7 need PA. Please advise

## 2022-05-21 NOTE — Telephone Encounter (Signed)
Spoke to pt stated--taking Tresiba 12 units, having low blood sugar reading 60 yesterday afternoon and last night after suffer down to 56. Please advise

## 2022-05-22 DIAGNOSIS — M545 Low back pain, unspecified: Secondary | ICD-10-CM | POA: Diagnosis not present

## 2022-05-22 DIAGNOSIS — E1122 Type 2 diabetes mellitus with diabetic chronic kidney disease: Secondary | ICD-10-CM | POA: Diagnosis not present

## 2022-05-22 DIAGNOSIS — N184 Chronic kidney disease, stage 4 (severe): Secondary | ICD-10-CM | POA: Diagnosis not present

## 2022-05-22 DIAGNOSIS — Z794 Long term (current) use of insulin: Secondary | ICD-10-CM | POA: Diagnosis not present

## 2022-05-24 ENCOUNTER — Other Ambulatory Visit: Payer: Self-pay | Admitting: Cardiovascular Disease

## 2022-05-24 ENCOUNTER — Other Ambulatory Visit: Payer: Self-pay | Admitting: Internal Medicine

## 2022-05-24 ENCOUNTER — Other Ambulatory Visit: Payer: Self-pay | Admitting: Nurse Practitioner

## 2022-05-24 NOTE — Telephone Encounter (Signed)
Please advise 

## 2022-06-04 ENCOUNTER — Other Ambulatory Visit: Payer: Self-pay

## 2022-06-04 ENCOUNTER — Emergency Department: Payer: Medicare HMO

## 2022-06-04 ENCOUNTER — Emergency Department
Admission: EM | Admit: 2022-06-04 | Discharge: 2022-06-04 | Disposition: A | Discharge status: Home / Self Care | Payer: Medicare HMO | Attending: Emergency Medicine | Admitting: Emergency Medicine

## 2022-06-04 DIAGNOSIS — E1122 Type 2 diabetes mellitus with diabetic chronic kidney disease: Secondary | ICD-10-CM | POA: Diagnosis not present

## 2022-06-04 DIAGNOSIS — R0781 Pleurodynia: Secondary | ICD-10-CM | POA: Diagnosis present

## 2022-06-04 DIAGNOSIS — I2510 Atherosclerotic heart disease of native coronary artery without angina pectoris: Secondary | ICD-10-CM | POA: Diagnosis not present

## 2022-06-04 DIAGNOSIS — W01198A Fall on same level from slipping, tripping and stumbling with subsequent striking against other object, initial encounter: Secondary | ICD-10-CM | POA: Diagnosis not present

## 2022-06-04 DIAGNOSIS — R519 Headache, unspecified: Secondary | ICD-10-CM | POA: Diagnosis not present

## 2022-06-04 DIAGNOSIS — W19XXXA Unspecified fall, initial encounter: Secondary | ICD-10-CM | POA: Diagnosis not present

## 2022-06-04 DIAGNOSIS — S80812A Abrasion, left lower leg, initial encounter: Secondary | ICD-10-CM | POA: Diagnosis not present

## 2022-06-04 DIAGNOSIS — N189 Chronic kidney disease, unspecified: Secondary | ICD-10-CM | POA: Diagnosis not present

## 2022-06-04 DIAGNOSIS — I129 Hypertensive chronic kidney disease with stage 1 through stage 4 chronic kidney disease, or unspecified chronic kidney disease: Secondary | ICD-10-CM | POA: Insufficient documentation

## 2022-06-04 DIAGNOSIS — S0031XA Abrasion of nose, initial encounter: Secondary | ICD-10-CM | POA: Insufficient documentation

## 2022-06-04 DIAGNOSIS — R0789 Other chest pain: Secondary | ICD-10-CM | POA: Diagnosis not present

## 2022-06-04 DIAGNOSIS — S20219A Contusion of unspecified front wall of thorax, initial encounter: Secondary | ICD-10-CM

## 2022-06-04 DIAGNOSIS — S79912A Unspecified injury of left hip, initial encounter: Secondary | ICD-10-CM | POA: Diagnosis not present

## 2022-06-04 DIAGNOSIS — S80811A Abrasion, right lower leg, initial encounter: Secondary | ICD-10-CM | POA: Insufficient documentation

## 2022-06-04 DIAGNOSIS — S0081XA Abrasion of other part of head, initial encounter: Secondary | ICD-10-CM | POA: Diagnosis not present

## 2022-06-04 DIAGNOSIS — S0990XA Unspecified injury of head, initial encounter: Secondary | ICD-10-CM | POA: Diagnosis not present

## 2022-06-04 DIAGNOSIS — Z043 Encounter for examination and observation following other accident: Secondary | ICD-10-CM | POA: Diagnosis not present

## 2022-06-04 DIAGNOSIS — M25552 Pain in left hip: Secondary | ICD-10-CM | POA: Insufficient documentation

## 2022-06-04 DIAGNOSIS — S20213A Contusion of bilateral front wall of thorax, initial encounter: Secondary | ICD-10-CM | POA: Insufficient documentation

## 2022-06-04 DIAGNOSIS — S199XXA Unspecified injury of neck, initial encounter: Secondary | ICD-10-CM | POA: Diagnosis not present

## 2022-06-04 DIAGNOSIS — S299XXA Unspecified injury of thorax, initial encounter: Secondary | ICD-10-CM | POA: Diagnosis not present

## 2022-06-04 DIAGNOSIS — S0993XA Unspecified injury of face, initial encounter: Secondary | ICD-10-CM | POA: Diagnosis not present

## 2022-06-04 MED ORDER — LIDOCAINE 5 % EX PTCH: 1.0000 | MEDICATED_PATCH | CUTANEOUS | Status: DC

## 2022-06-04 NOTE — ED Provider Triage Note (Signed)
Emergency Medicine Provider Triage Evaluation Note  [name] , a 62 y.o. male  was evaluated in triage.  Pt complains of pain after mechanical fall. He tripped over a wire and has facial pain/abrasions, neck pain rib pain, and chest wall pain.Marland Kitchen  Physical Exam  BP 108/70   Pulse 76   Temp 98.6 F (37 C)   Resp 18   Wt 99.8 kg   SpO2 97%   BMI 35.51 kg/m  Gen:   Awake, no distress   Resp:  Normal effort  MSK:   Moves extremities without difficulty  Other:    Medical Decision Making  Medically screening exam initiated at 5:17 PM.  Appropriate orders placed.  [name] was informed that the remainder of the evaluation will be completed by another provider, this initial triage assessment does not replace that evaluation, and the importance of remaining in the ED until their evaluation is complete.  Imaging ordered.   Chinita Pester, FNP 06/04/22 2035

## 2022-06-04 NOTE — ED Triage Notes (Addendum)
First nurse note: Pt to ED via [facility] from home. Pt was outside and tripped and fell face forward onto gravel. Pt has multiple lacerations to chin, bridge of nose and extremities. Pt reports pain to left hip and CP after fall. No LOC.   EMS VS:  BP 132/70 HR 80 98% RA CBG 86

## 2022-06-04 NOTE — ED Notes (Signed)
Pt wife to desk with pt in wheelchair, stating they prefer to not wait for pain patch. Pt out of dept via wheelchair with wife

## 2022-06-04 NOTE — ED Provider Notes (Signed)
[facility] Provider Note    Event Date/Time   First MD Initiated Contact with Patient 06/04/22 2133     (approximate)   History   Chief Complaint Fall   HPI  [name] is a 62 y.o. male with past medical history of hypertension, diabetes, CAD, and CKD who presents to the ED complaining of fall.  Patient reports that he tripped over a piece of wire, falling forward and striking his face on some gravel.  He denies losing consciousness, does take Plavix and aspirin regularly.  He complains of pain in his left hip as well as both sides of his chest.      Physical Exam   Triage Vital Signs: ED Triage Vitals  Enc Vitals Group     BP 06/04/22 1714 108/70     Pulse Rate 06/04/22 1714 76     Resp 06/04/22 1714 18     Temp 06/04/22 1716 98.6 F (37 C)     Temp src --      SpO2 06/04/22 1714 97 %     Weight 06/04/22 1715 220 lb (99.8 kg)     Height --      Head Circumference --      Peak Flow --      Pain Score 06/04/22 1714 8     Pain Loc --      Pain Edu? --      Excl. in GC? --     Most recent vital signs: Vitals:   06/04/22 1714 06/04/22 1716  BP: 108/70   Pulse: 76   Resp: 18   Temp:  98.6 F (37 C)  SpO2: 97%     Constitutional: Alert and oriented. Eyes: Conjunctivae are normal. Head: Abrasion noted to the nose with no hematoma or facial bony step-off. Nose: No congestion/rhinnorhea. Mouth/Throat: Mucous membranes are moist.  Neck: No midline cervical spine tenderness to palpation. Cardiovascular: Normal rate, regular rhythm. Grossly normal heart sounds.  2+ radial pulses bilaterally. Respiratory: Normal respiratory effort.  No retractions. Lungs CTAB.  Bilateral chest wall tenderness to palpation. Gastrointestinal: Soft and nontender. No distention. Musculoskeletal: No lower extremity tenderness nor edema.  No upper extremity bony tenderness to palpation. Neurologic:  Normal speech and language. No gross focal neurologic  deficits are appreciated.    ED Results / Procedures / Treatments   Labs (all labs ordered are listed, but only abnormal results are displayed) Labs Reviewed - No data to display   EKG  ED ECG REPORT I, Chesley Noon, the attending physician, personally viewed and interpreted this ECG.   Date: 06/04/2022  EKG Time: 17:14  Rate: 76  Rhythm: normal sinus rhythm  Axis: Normal  Intervals:none  ST&T Change: None  RADIOLOGY Chest x-ray reviewed and interpreted by me with no fracture, hemothorax or pneumothorax.  PROCEDURES:  Critical Care performed: No  Procedures   MEDICATIONS ORDERED IN ED: Medications  lidocaine (LIDODERM) 5 % 1 patch (has no administration in time range)     IMPRESSION / MDM / ASSESSMENT AND PLAN / ED COURSE  I reviewed the triage vital signs and the nursing notes.                              62 y.o. male with past medical history of hypertension, diabetes, CAD, and CKD who presents to the ED following trip and fall, striking his face and chest.  Patient's presentation is most  consistent with acute complicated illness / injury requiring diagnostic workup.  Differential diagnosis includes, but is not limited to, intracranial injury, cervical spine injury, rib fracture, hemothorax, pneumothorax.  Patient well-appearing and in no acute distress, vital signs are unremarkable.  EKG shows no evidence of arrhythmia or ischemia, CT head and cervical spine are negative for acute traumatic injury.  He does have evidence of disc herniation on CT of cervical spine, but does not have any symptoms related to this currently and MRI may be performed as an outpatient.  Chest x-ray is unremarkable, left hip x-ray also unremarkable.  We will treat symptomatically with Lidoderm patch and Tylenol, patient appropriate for discharge home with outpatient follow-up.  He was counseled to return to the ED for new or worsening symptoms, patient agrees with plan.       FINAL CLINICAL IMPRESSION(S) / ED DIAGNOSES   Final diagnoses:  Fall, initial encounter  Contusion of chest wall, unspecified laterality, initial encounter     Rx / DC Orders   ED Discharge Orders     None        Note:  This document was prepared using Dragon voice recognition software and may include unintentional dictation errors.   Chesley Noon, MD 06/05/22 [phone]

## 2022-06-04 NOTE — ED Triage Notes (Signed)
Pt to ED [facility] from home for fall after tripping over wire. States left hip hurts, and bilateral rib cage hurts from hitting it on gravel. Pain increases with movement and palpitation. Abrasions noted to nose, chin, bilateral legs.  Denies loc

## 2022-06-05 ENCOUNTER — Other Ambulatory Visit (HOSPITAL_COMMUNITY): Payer: Self-pay

## 2022-06-07 ENCOUNTER — Other Ambulatory Visit: Payer: Self-pay | Admitting: Internal Medicine

## 2022-06-07 ENCOUNTER — Telehealth: Payer: Self-pay

## 2022-06-07 NOTE — Telephone Encounter (Signed)
     Patient  visit on 4/16/  at [facility] was for fall.  Have you been able to follow up with your primary care physician? Patient plans to call today.  The patient was or was not able to obtain any needed medicine or equipment. No medication prescribed. Patient stated that he did not receive lidocaine patch or tylenol. He will call today to follow up.  Are there diet recommendations that you are having difficulty following? No  Patient expresses understanding of discharge instructions and education provided has no other needs at this time. Patient stated that he did not receive a copy of discharge instructions. I instructed patient to check his MyChart and he said he would have his daughter check for him.   Nur Rabold [facility]  [facility] Population Health Community Resource Care Guide   [email or web address]  ?? [phone]   Website: [email or web address]  [email or web address]

## 2022-06-10 ENCOUNTER — Other Ambulatory Visit (HOSPITAL_COMMUNITY): Payer: Self-pay

## 2022-06-10 ENCOUNTER — Telehealth: Payer: Self-pay | Admitting: Pharmacy Technician

## 2022-06-10 DIAGNOSIS — R0789 Other chest pain: Secondary | ICD-10-CM | POA: Diagnosis not present

## 2022-06-10 NOTE — Telephone Encounter (Signed)
Pharmacy Patient Advocate Encounter   Received notification from RX request msgs/CMA that prior authorization for Dexcom G7 is required/requested.  Per Test Claim: no PA needed. Copay is $260.80   Relayed info back to CMA.

## 2022-06-10 NOTE — Telephone Encounter (Signed)
Reply was done in RX request msg. Apologies that this one was missed earlier. Can also see Telephone Prior Auth encounter from 06/10/22 for info.

## 2022-06-18 DIAGNOSIS — E1159 Type 2 diabetes mellitus with other circulatory complications: Secondary | ICD-10-CM | POA: Diagnosis not present

## 2022-06-24 DIAGNOSIS — Z955 Presence of coronary angioplasty implant and graft: Secondary | ICD-10-CM | POA: Diagnosis not present

## 2022-06-24 DIAGNOSIS — R079 Chest pain, unspecified: Secondary | ICD-10-CM | POA: Diagnosis not present

## 2022-06-24 DIAGNOSIS — I10 Essential (primary) hypertension: Secondary | ICD-10-CM | POA: Diagnosis not present

## 2022-06-24 DIAGNOSIS — E119 Type 2 diabetes mellitus without complications: Secondary | ICD-10-CM | POA: Diagnosis not present

## 2022-06-24 DIAGNOSIS — I252 Old myocardial infarction: Secondary | ICD-10-CM | POA: Diagnosis not present

## 2022-06-24 DIAGNOSIS — E669 Obesity, unspecified: Secondary | ICD-10-CM | POA: Diagnosis not present

## 2022-06-24 DIAGNOSIS — E785 Hyperlipidemia, unspecified: Secondary | ICD-10-CM | POA: Diagnosis not present

## 2022-06-24 DIAGNOSIS — R0789 Other chest pain: Secondary | ICD-10-CM | POA: Diagnosis not present

## 2022-06-24 DIAGNOSIS — Z87891 Personal history of nicotine dependence: Secondary | ICD-10-CM | POA: Diagnosis not present

## 2022-06-24 DIAGNOSIS — I2510 Atherosclerotic heart disease of native coronary artery without angina pectoris: Secondary | ICD-10-CM | POA: Diagnosis not present

## 2022-06-29 DIAGNOSIS — R079 Chest pain, unspecified: Secondary | ICD-10-CM | POA: Diagnosis not present

## 2022-07-02 DIAGNOSIS — Z794 Long term (current) use of insulin: Secondary | ICD-10-CM | POA: Diagnosis not present

## 2022-07-02 DIAGNOSIS — N184 Chronic kidney disease, stage 4 (severe): Secondary | ICD-10-CM | POA: Diagnosis not present

## 2022-07-02 DIAGNOSIS — E1122 Type 2 diabetes mellitus with diabetic chronic kidney disease: Secondary | ICD-10-CM | POA: Diagnosis not present

## 2022-07-02 DIAGNOSIS — D649 Anemia, unspecified: Secondary | ICD-10-CM | POA: Diagnosis not present

## 2022-07-02 DIAGNOSIS — R42 Dizziness and giddiness: Secondary | ICD-10-CM | POA: Diagnosis not present

## 2022-07-04 DIAGNOSIS — E1122 Type 2 diabetes mellitus with diabetic chronic kidney disease: Secondary | ICD-10-CM | POA: Diagnosis not present

## 2022-07-04 DIAGNOSIS — I252 Old myocardial infarction: Secondary | ICD-10-CM | POA: Diagnosis not present

## 2022-07-04 DIAGNOSIS — Z794 Long term (current) use of insulin: Secondary | ICD-10-CM | POA: Diagnosis not present

## 2022-07-04 DIAGNOSIS — I129 Hypertensive chronic kidney disease with stage 1 through stage 4 chronic kidney disease, or unspecified chronic kidney disease: Secondary | ICD-10-CM | POA: Diagnosis not present

## 2022-07-04 DIAGNOSIS — R110 Nausea: Secondary | ICD-10-CM | POA: Diagnosis not present

## 2022-07-04 DIAGNOSIS — I2510 Atherosclerotic heart disease of native coronary artery without angina pectoris: Secondary | ICD-10-CM | POA: Diagnosis not present

## 2022-07-04 DIAGNOSIS — I951 Orthostatic hypotension: Secondary | ICD-10-CM | POA: Diagnosis not present

## 2022-07-04 DIAGNOSIS — R42 Dizziness and giddiness: Secondary | ICD-10-CM | POA: Diagnosis not present

## 2022-07-04 DIAGNOSIS — F17210 Nicotine dependence, cigarettes, uncomplicated: Secondary | ICD-10-CM | POA: Diagnosis not present

## 2022-07-04 DIAGNOSIS — N1832 Chronic kidney disease, stage 3b: Secondary | ICD-10-CM | POA: Diagnosis not present

## 2022-07-06 DIAGNOSIS — I951 Orthostatic hypotension: Secondary | ICD-10-CM | POA: Diagnosis not present

## 2022-07-22 DIAGNOSIS — E1122 Type 2 diabetes mellitus with diabetic chronic kidney disease: Secondary | ICD-10-CM | POA: Diagnosis not present

## 2022-07-22 DIAGNOSIS — I10 Essential (primary) hypertension: Secondary | ICD-10-CM | POA: Diagnosis not present

## 2022-07-22 DIAGNOSIS — M9902 Segmental and somatic dysfunction of thoracic region: Secondary | ICD-10-CM | POA: Diagnosis not present

## 2022-07-22 DIAGNOSIS — M9903 Segmental and somatic dysfunction of lumbar region: Secondary | ICD-10-CM | POA: Diagnosis not present

## 2022-07-22 DIAGNOSIS — N184 Chronic kidney disease, stage 4 (severe): Secondary | ICD-10-CM | POA: Diagnosis not present

## 2022-07-22 DIAGNOSIS — M9904 Segmental and somatic dysfunction of sacral region: Secondary | ICD-10-CM | POA: Diagnosis not present

## 2022-07-24 DIAGNOSIS — I10 Essential (primary) hypertension: Secondary | ICD-10-CM | POA: Diagnosis not present

## 2022-07-24 DIAGNOSIS — M9904 Segmental and somatic dysfunction of sacral region: Secondary | ICD-10-CM | POA: Diagnosis not present

## 2022-07-24 DIAGNOSIS — M9903 Segmental and somatic dysfunction of lumbar region: Secondary | ICD-10-CM | POA: Diagnosis not present

## 2022-07-24 DIAGNOSIS — D631 Anemia in chronic kidney disease: Secondary | ICD-10-CM | POA: Diagnosis not present

## 2022-07-24 DIAGNOSIS — M9902 Segmental and somatic dysfunction of thoracic region: Secondary | ICD-10-CM | POA: Diagnosis not present

## 2022-07-24 DIAGNOSIS — N1832 Chronic kidney disease, stage 3b: Secondary | ICD-10-CM | POA: Diagnosis not present

## 2022-07-24 DIAGNOSIS — E1122 Type 2 diabetes mellitus with diabetic chronic kidney disease: Secondary | ICD-10-CM | POA: Diagnosis not present

## 2022-07-29 DIAGNOSIS — M9904 Segmental and somatic dysfunction of sacral region: Secondary | ICD-10-CM | POA: Diagnosis not present

## 2022-07-29 DIAGNOSIS — M9903 Segmental and somatic dysfunction of lumbar region: Secondary | ICD-10-CM | POA: Diagnosis not present

## 2022-07-29 DIAGNOSIS — M9902 Segmental and somatic dysfunction of thoracic region: Secondary | ICD-10-CM | POA: Diagnosis not present

## 2022-07-31 DIAGNOSIS — M9902 Segmental and somatic dysfunction of thoracic region: Secondary | ICD-10-CM | POA: Diagnosis not present

## 2022-07-31 DIAGNOSIS — M9904 Segmental and somatic dysfunction of sacral region: Secondary | ICD-10-CM | POA: Diagnosis not present

## 2022-07-31 DIAGNOSIS — M9903 Segmental and somatic dysfunction of lumbar region: Secondary | ICD-10-CM | POA: Diagnosis not present

## 2022-08-02 DIAGNOSIS — M9902 Segmental and somatic dysfunction of thoracic region: Secondary | ICD-10-CM | POA: Diagnosis not present

## 2022-08-02 DIAGNOSIS — M9904 Segmental and somatic dysfunction of sacral region: Secondary | ICD-10-CM | POA: Diagnosis not present

## 2022-08-02 DIAGNOSIS — M9903 Segmental and somatic dysfunction of lumbar region: Secondary | ICD-10-CM | POA: Diagnosis not present

## 2022-08-05 DIAGNOSIS — M9904 Segmental and somatic dysfunction of sacral region: Secondary | ICD-10-CM | POA: Diagnosis not present

## 2022-08-05 DIAGNOSIS — M9902 Segmental and somatic dysfunction of thoracic region: Secondary | ICD-10-CM | POA: Diagnosis not present

## 2022-08-05 DIAGNOSIS — M9903 Segmental and somatic dysfunction of lumbar region: Secondary | ICD-10-CM | POA: Diagnosis not present

## 2022-08-07 DIAGNOSIS — M9903 Segmental and somatic dysfunction of lumbar region: Secondary | ICD-10-CM | POA: Diagnosis not present

## 2022-08-07 DIAGNOSIS — M9904 Segmental and somatic dysfunction of sacral region: Secondary | ICD-10-CM | POA: Diagnosis not present

## 2022-08-07 DIAGNOSIS — M9902 Segmental and somatic dysfunction of thoracic region: Secondary | ICD-10-CM | POA: Diagnosis not present

## 2022-08-12 DIAGNOSIS — M9902 Segmental and somatic dysfunction of thoracic region: Secondary | ICD-10-CM | POA: Diagnosis not present

## 2022-08-12 DIAGNOSIS — M9904 Segmental and somatic dysfunction of sacral region: Secondary | ICD-10-CM | POA: Diagnosis not present

## 2022-08-12 DIAGNOSIS — M9903 Segmental and somatic dysfunction of lumbar region: Secondary | ICD-10-CM | POA: Diagnosis not present

## 2022-08-14 DIAGNOSIS — M9903 Segmental and somatic dysfunction of lumbar region: Secondary | ICD-10-CM | POA: Diagnosis not present

## 2022-08-14 DIAGNOSIS — M9902 Segmental and somatic dysfunction of thoracic region: Secondary | ICD-10-CM | POA: Diagnosis not present

## 2022-08-14 DIAGNOSIS — M9904 Segmental and somatic dysfunction of sacral region: Secondary | ICD-10-CM | POA: Diagnosis not present

## 2022-09-16 DIAGNOSIS — E1159 Type 2 diabetes mellitus with other circulatory complications: Secondary | ICD-10-CM | POA: Diagnosis not present

## 2022-09-30 ENCOUNTER — Encounter: Payer: Self-pay | Admitting: Internal Medicine

## 2022-09-30 ENCOUNTER — Other Ambulatory Visit: Payer: Self-pay | Admitting: Cardiovascular Disease

## 2022-09-30 ENCOUNTER — Ambulatory Visit: Payer: Medicare HMO | Admitting: Internal Medicine

## 2022-09-30 VITALS — BP 122/80 | HR 65 | Ht 66.0 in | Wt 211.0 lb

## 2022-09-30 DIAGNOSIS — E1165 Type 2 diabetes mellitus with hyperglycemia: Secondary | ICD-10-CM

## 2022-09-30 DIAGNOSIS — E1122 Type 2 diabetes mellitus with diabetic chronic kidney disease: Secondary | ICD-10-CM | POA: Diagnosis not present

## 2022-09-30 DIAGNOSIS — N1831 Chronic kidney disease, stage 3a: Secondary | ICD-10-CM

## 2022-09-30 DIAGNOSIS — E1159 Type 2 diabetes mellitus with other circulatory complications: Secondary | ICD-10-CM | POA: Diagnosis not present

## 2022-09-30 DIAGNOSIS — E782 Mixed hyperlipidemia: Secondary | ICD-10-CM

## 2022-09-30 LAB — POCT GLYCOSYLATED HEMOGLOBIN (HGB A1C): Hemoglobin A1C: 12.2 % — AB (ref 4.0–5.6)

## 2022-09-30 MED ORDER — ATORVASTATIN CALCIUM 80 MG PO TABS: 80.0000 mg | ORAL_TABLET | Freq: Every day | ORAL | 3 refills | Status: DC

## 2022-09-30 MED ORDER — DAPAGLIFLOZIN PROPANEDIOL 10 MG PO TABS: 10.0000 mg | ORAL_TABLET | Freq: Every day | ORAL | 3 refills | Status: DC

## 2022-09-30 MED ORDER — PIOGLITAZONE HCL 45 MG PO TABS: 45.0000 mg | ORAL_TABLET | Freq: Every day | ORAL | 3 refills | Status: DC

## 2022-09-30 MED ORDER — BD PEN NEEDLE MINI U/F 31G X 5 MM MISC: 1.0000 | Freq: Every day | 3 refills | Status: DC

## 2022-09-30 MED ORDER — GLIPIZIDE 5 MG PO TABS: 5.0000 mg | ORAL_TABLET | Freq: Two times a day (BID) | ORAL | 3 refills | Status: DC

## 2022-09-30 MED ORDER — TRESIBA FLEXTOUCH 100 UNIT/ML ~~LOC~~ SOPN: 20.0000 [IU] | PEN_INJECTOR | Freq: Every day | SUBCUTANEOUS | 3 refills | Status: DC

## 2022-09-30 MED ORDER — FENOFIBRATE 145 MG PO TABS: 145.0000 mg | ORAL_TABLET | Freq: Every day | ORAL | 3 refills | Status: DC

## 2022-09-30 NOTE — Patient Instructions (Addendum)
-   Stop Glipizide 2.5mg  XL - Start  Glipizide 5 mg, 1 tablet before Breakfast and 1 tablet Before Supper  - Continue Farxiga 10 mg daily in the morning  - Continue Pioglitazone 45 mg daily  - Decrease Tresiba 20 units daily     HOW TO TREAT LOW BLOOD SUGARS (Blood sugar LESS THAN 70 MG/DL) Please follow the RULE OF 15 for the treatment of hypoglycemia treatment (when your (blood sugars are less than 70 mg/dL)   STEP 1: Take 15 grams of carbohydrates when your blood sugar is low, which includes:  3-4 GLUCOSE TABS  OR 3-4 OZ OF JUICE OR REGULAR SODA OR ONE TUBE OF GLUCOSE GEL    STEP 2: RECHECK blood sugar in 15 MINUTES STEP 3: If your blood sugar is still low at the 15 minute recheck --> then, go back to STEP 1 and treat AGAIN with another 15 grams of carbohydrates.

## 2022-09-30 NOTE — Progress Notes (Signed)
Name: [name]  Age/ Sex: 62 y.o., male   MRN/ DOB: [id number], 1960-12-20     PCP: Dorothey Baseman, MD   Reason for Endocrinology Evaluation: Type 2 Diabetes Mellitus  Initial Endocrine Consultative Visit: 11/03/2019    PATIENT IDENTIFIER: Mr. LYNKOLN Carr is a 62 y.o. male with a past medical history of T2DM, CAD, OSA and HTN.  The patient has followed with Endocrinology clinic since 11/03/2019 for consultative assistance with management of his diabetes.  DIABETIC HISTORY:  [name] was diagnosed with DM many years ago. Trulicity has caused nausea. His hemoglobin A1c has ranged from 9.0% in 12/2019, peaking at 12.1% in 10/2019.  No personal hx of pancreatitis    On his initial visit to our clinic he had an A1c of 12.1 %. He was on metformin, Glipizide and pioglitazone. We increased Glipizide     By 12/2019 reduced metformin due to low GFG, stopped by October 2023 due to diarrhea and low GFR  Stopped Glipizide and started basal insulin 06/2020 as well as Marcelline Deist    Started Trulicity in May 2023 but was unable to tolerate due to abdominal pain and stopped by October 2023  He stopped metformin due to diarrhea 12/2022 Started Ozempic 12/2022 but did not tolerate   He was started on Glipizide XL 2.5 mg daily by his PCP which results in hypoglycemia as low as 59 mg/dL   SUBJECTIVE:   During the last visit (05/10/2022): A1c 6.3 %    Today (09/30/2022): [name] is here for a follow up on diabetes.  He is accompanied by his wife Junious Dresser. He checks his blood sugars occasionally . The patient has had hypoglycemic episodes since the last clinic visit.   Patient continues to follow-up with nephrology (Dr. Thedore Mins)   He self discontinued oral glycemic agents for ~ 3 weeks due to dizziness  Pt restarted oral glycemic agents a week ago  Denies nausea or vomiting  Denies constipation  or diarrhea  Dizziness resolved   HOME ENDOCRINE  REGIMEN:  Pioglitazone 45 mg daily  Farxiga 10  mg daily in the morning  Tresiba 22 units daily  glipizide 2.5 mg XL, 1 tablet before breakfast   Atorvastatin 80 mg daily Fenofibrate 145 mg daily      Statin: yes ACE-I/ARB: no   METER DOWNLOAD SUMMARY:Unable to download 90 day average 348 mg/dL  409 - 811 mg/dL      CONTINUOUS GLUCOSE MONITORING RECORD INTERPRETATION    Dates of Recording: 7/30 - 09/30/2022  Sensor description: Dexcom  Results statistics:   CGM use % of time 93  Average and SD 206/62  Time in range 42      %  % Time Above 180 32  % Time above 250 26  % Time Below target 0   Glycemic patterns summary: BG's trend down overnight and increased throughout the day  Hyperglycemic episodes postprandial  Hypoglycemic episodes occurred N/A  Overnight periods: Trends down    DIABETIC COMPLICATIONS: Microvascular complications:   Denies: Denies: CKD, retinopathy, neuropathy  Last Eye Exam: Completed 01/2021  Macrovascular complications:  CAD ( S/P PCI) Denies: CVA, PVD   HISTORY:  Past Medical History:  Past Medical History:  Diagnosis Date   Bilateral hip pain    a. 05/2021 ABIs: nl bilaterally.   CKD (chronic kidney disease), stage III (HCC)    Coronary artery disease    a.  H/o NSTEMI s/p LAD/RCA stenting; b. 08/2019 PCI: RCA 99p (2.5x15 Resolute Onyx  DES), 75p/m, mild ISR; c. 09/2019 PCI: 85p - heavily calcified (intravascular lithotripsy & 3.0x26 Resolute Onyx DES); d. 10/18/19 Relook cath: stable anatomy->Med Rx;  e. 05/2021 Cath: LM 25, LAD 24m ISR, 75/70/50d, RI small - 90, LCX 50p/m/d, OM3 40, RCA 30p ISR, 20p/m ISR, 60d, RPDA 70-->Med Rx.   Diabetes mellitus without complication (HCC)    Diastolic dysfunction    a. 08/2019 Echo: EF 50-55%, no rwma, Gr1 DD, nl RV size/fxn. Triv MR.   GERD (gastroesophageal reflux disease)    Hyperlipidemia    Hypertension    NSTEMI (non-ST elevated myocardial infarction) (HCC) 08/2019   Sleep apnea    Tobacco abuse    Past Surgical History:  Past  Surgical History:  Procedure Laterality Date   APPENDECTOMY     CARDIAC CATHETERIZATION     COLONOSCOPY     COLONOSCOPY WITH PROPOFOL N/A 09/16/2017   Procedure: COLONOSCOPY WITH PROPOFOL;  Surgeon: Toledo, Boykin Nearing, MD;  Location: [facility] ENDOSCOPY;  Service: Gastroenterology;  Laterality: N/A;  (+) DM - oral   CORONARY STENT INTERVENTION N/A 08/31/2019   Procedure: CORONARY STENT INTERVENTION;  Surgeon: Alwyn Pea, MD;  Location: [facility] INVASIVE CV LAB;  Service: Cardiovascular;  Laterality: N/A;   CORONARY STENT INTERVENTION  09/29/2019   CORONARY STENT INTERVENTION N/A 09/29/2019   Procedure: CORONARY STENT INTERVENTION;  Surgeon: Iran Ouch, MD;  Location: [facility];  Service: Cardiovascular;  Laterality: N/A;   EXPLORATORY LAPAROTOMY     EYE SURGERY     HERNIA REPAIR     LEFT HEART CATH AND CORONARY ANGIOGRAPHY N/A 08/31/2019   Procedure: LEFT HEART CATH AND CORONARY ANGIOGRAPHY possible PCI and stent;  Surgeon: Alwyn Pea, MD;  Location: [facility] CV [facility];  Service: Cardiovascular;  Laterality: N/A;   LEFT HEART CATH AND CORONARY ANGIOGRAPHY Left 10/18/2019   Procedure: LEFT HEART CATH AND CORONARY ANGIOGRAPHY;  Surgeon: Iran Ouch, MD;  Location: [facility] CV [facility];  Service: Cardiovascular;  Laterality: Left;   LEFT HEART CATH AND CORONARY ANGIOGRAPHY Left 05/25/2021   Procedure: LEFT HEART CATH AND CORONARY ANGIOGRAPHY;  Surgeon: Iran Ouch, MD;  Location: [facility] CV [facility];  Service: Cardiovascular;  Laterality: Left;   Social History:  reports that he has been smoking cigarettes. He has a 12.5 pack-year smoking history. He has never used smokeless tobacco. He reports that he does not drink alcohol and does not use drugs. Family History:  Family History  Problem Relation Age of Onset   Heart disease Father    Heart attack Father    Hypertension Father    Diabetes Father      HOME MEDICATIONS: Allergies as of 09/30/2022        Reactions   Shellfish Allergy Anaphylaxis   Penicillins Swelling   Childhood   Lisinopril Other (See Comments)   Hyperkalemia, increased creatinine   Erythromycin Rash, Other (See Comments)   Unknown        Medication List        Accurate as of September 30, 2022  8:38 AM. If you have any questions, ask your nurse or doctor.          Accu-Chek FastClix Lancets Misc Use as instructed to check blood sugar up to 2 times daily   Accu-Chek Softclix Lancets lancets Use to check blood sugars 2 times daily   Accu-Chek Guide Me w/Device Kit Use as instructed to check blood sugar up to 2 times daily   Accu-Chek Guide  test strip Generic drug: glucose blood USE AS INSTRUCTED TO CHECK BLOOD SUGAR UP TO 2 TIMES DAILY   Accu-Chek Guide test strip Generic drug: glucose blood 1 each by Other route in the morning and at bedtime. Use as instructed   aspirin EC 81 MG tablet Take 81 mg by mouth daily.   atorvastatin 80 MG tablet Commonly known as: LIPITOR Take 1 tablet (80 mg total) by mouth daily.   B-D UF III MINI PEN NEEDLES 31G X 5 MM Misc Generic drug: Insulin Pen Needle Inject 1 Device into the skin daily in the afternoon.   clopidogrel 75 MG tablet Commonly known as: PLAVIX TAKE 1 TABLET BY MOUTH EVERY DAY   cyanocobalamin 1000 MCG tablet Commonly known as: VITAMIN B12 Take 1,000 mcg by mouth daily.   cyclobenzaprine 5 MG tablet Commonly known as: FLEXERIL Take 1 tablet (5 mg total) by mouth 3 (three) times daily as needed for muscle spasms.   dapagliflozin propanediol 10 MG Tabs tablet Commonly known as: Farxiga Take 1 tablet (10 mg total) by mouth daily before breakfast.   Dexcom G7 Sensor Misc CHANGE SENSORS EVERY 10 DAYS   EPINEPHrine 0.3 mg/0.3 mL Soaj injection Commonly known as: EPI-PEN Inject 0.3 mg into the muscle as needed for anaphylaxis.   erythromycin ophthalmic ointment   famotidine 20 MG tablet Commonly known as: PEPCID Take 20 mg by mouth  daily.   fenofibrate 145 MG tablet Commonly known as: TRICOR Take 1 tablet (145 mg total) by mouth daily.   Fish Oil 1000 MG Caps Take 1,000 mg by mouth daily.   glipiZIDE 2.5 MG 24 hr tablet Commonly known as: GLUCOTROL XL Take 1 tablet (2.5 mg total) by mouth daily with breakfast.   isosorbide mononitrate 30 MG 24 hr tablet Commonly known as: IMDUR TAKE 1 TABLET BY MOUTH EVERY DAY   lisinopril 2.5 MG tablet Commonly known as: ZESTRIL TAKE 1 TABLET BY MOUTH EVERY DAY   metoprolol succinate 25 MG 24 hr tablet Commonly known as: TOPROL-XL TAKE 1/2 TABLET BY MOUTH EVERY DAY   nitroGLYCERIN 0.4 MG SL tablet Commonly known as: NITROSTAT Place 1 tablet (0.4 mg total) under the tongue every 5 (five) minutes as needed for chest pain.   pioglitazone 45 MG tablet Commonly known as: ACTOS Take 1 tablet (45 mg total) by mouth daily.   ranolazine 500 MG 12 hr tablet Commonly known as: RANEXA TAKE 1 TABLET BY MOUTH TWICE A DAY   Tresiba FlexTouch 100 UNIT/ML FlexTouch Pen Generic drug: insulin degludec Inject 26 Units into the skin daily. What changed: how much to take   ZINC PO Take 1 capsule by mouth daily.         OBJECTIVE:   Vital Signs: BP 122/80 (BP Location: Left Arm, Patient Position: Sitting, Cuff Size: Large)   Pulse 65   Ht 5\' 6"  (1.676 m)   Wt 211 lb (95.7 kg)   SpO2 99%   BMI 34.06 kg/m   Wt Readings from Last 3 Encounters:  09/30/22 211 lb (95.7 kg)  06/04/22 220 lb (99.8 kg)  05/10/22 223 lb 12.8 oz (101.5 kg)     Exam: General: Pt appears well and is in NAD  Lungs: Coarse breathing sounds   Heart: RRR   Extremities: No pretibial edema.  Neuro: MS is good with appropriate affect, pt is alert and Ox3       DM foot exam: 01/02/2022   The skin of the feet is intact without sores or  ulcerations. The pedal pulses are 1+ on right and 1+ on left. The sensation is intact to a screening 5.07, 10 gram monofilament bilaterally    DATA  REVIEWED:  Lab Results  Component Value Date   HGBA1C 12.2 (A) 09/30/2022   HGBA1C 6.3 (A) 05/10/2022   HGBA1C 7.6 (A) 01/02/2022  03/27/2022 Na 134 K5.3 BUN 39 Cr.  2.5 GFR 28 A1c 8.6% TG 151 HDL 34.8 LDL 63   ASSESSMENT / PLAN / RECOMMENDATIONS:   1) Type 2 Diabetes Mellitus, With CKD IV and macrovascular  complications - Most recent A1c of 12.2  %. Goal A1c < 7.0.    -His A1c has trended up from 6.3% to 12.2%, this is due to medication nonadherence, patient self discontinued all glycemic agents due to dizziness, he has restarted his medications approximately a week ago -In reviewing CGM data, patient continues with postprandial hyperglycemia, I will switch glipizide as below and decrease basal insulin preemptively to prevent hypoglycemia -He is intolerant to metformin due to diarrhea -He is intolerant to Trulicity and Ozempic due to abdominal pain   MEDICATIONS:  -Stop Glipizide 2.5 mg XL, 1 tablet before breakfast -Start glipizide 5 mg, 1 tablet before breakfast and 1 tablet before supper - Continue Farxiga 10 mg daily in the morning  - Continue Pioglitazone 45 mg daily  - Decrease Tresiba to 20 units daily   EDUCATION / INSTRUCTIONS: BG monitoring instructions: Patient is instructed to check his blood sugars 2 times a day, before breakfast and supper. Call [facility] if: BG persistently < 70  I reviewed the Rule of 15 for the treatment of hypoglycemia in detail with the patient. Literature supplied.     2) Diabetic complications:  Eye: Does not have known diabetic retinopathy.  Neuro/ Feet: Does not have known diabetic peripheral neuropathy .  Renal: Patient does not have known baseline CKD. He   is not a candidate for  ACEI/ARB at present due to hyperkalemia . MA/Cr ratio is normal    3) Mixed Dyslipidemia:    -TG improved from 935 in 11/2020 , to 177 mg/DL 14/7829 -LDL at goal  Medication Continue atorvastatin 80 mg daily Continue  fenofibrate 145 mg daily   F/U in 3 months   Signed electronically by: Lyndle Herrlich, MD  [facility] Endocrinology  [facility] [address] [place] [place], [place] [address] Phone: [phone] FAX: [phone]   CC: Dorothey Baseman, MD [address] [place] [address] Phone: [phone]  Fax: [phone]  Return to Endocrinology clinic as below: Future Appointments  Date Time Provider Department Center  11/15/2022  9:15 AM Creig Hines, NP [facility] None

## 2022-10-01 ENCOUNTER — Encounter: Payer: Self-pay | Admitting: Internal Medicine

## 2022-10-07 DIAGNOSIS — I25118 Atherosclerotic heart disease of native coronary artery with other forms of angina pectoris: Secondary | ICD-10-CM | POA: Diagnosis not present

## 2022-10-07 DIAGNOSIS — F17210 Nicotine dependence, cigarettes, uncomplicated: Secondary | ICD-10-CM | POA: Diagnosis not present

## 2022-10-07 DIAGNOSIS — E782 Mixed hyperlipidemia: Secondary | ICD-10-CM | POA: Diagnosis not present

## 2022-10-07 DIAGNOSIS — I129 Hypertensive chronic kidney disease with stage 1 through stage 4 chronic kidney disease, or unspecified chronic kidney disease: Secondary | ICD-10-CM | POA: Diagnosis not present

## 2022-10-07 DIAGNOSIS — E1165 Type 2 diabetes mellitus with hyperglycemia: Secondary | ICD-10-CM | POA: Diagnosis not present

## 2022-10-07 DIAGNOSIS — E1122 Type 2 diabetes mellitus with diabetic chronic kidney disease: Secondary | ICD-10-CM | POA: Diagnosis not present

## 2022-10-07 DIAGNOSIS — R220 Localized swelling, mass and lump, head: Secondary | ICD-10-CM | POA: Diagnosis not present

## 2022-10-07 DIAGNOSIS — N1832 Chronic kidney disease, stage 3b: Secondary | ICD-10-CM | POA: Diagnosis not present

## 2022-10-07 DIAGNOSIS — Z794 Long term (current) use of insulin: Secondary | ICD-10-CM | POA: Diagnosis not present

## 2022-10-16 DIAGNOSIS — T783XXD Angioneurotic edema, subsequent encounter: Secondary | ICD-10-CM | POA: Diagnosis not present

## 2022-10-16 DIAGNOSIS — J3089 Other allergic rhinitis: Secondary | ICD-10-CM | POA: Diagnosis not present

## 2022-10-16 DIAGNOSIS — J301 Allergic rhinitis due to pollen: Secondary | ICD-10-CM | POA: Diagnosis not present

## 2022-10-24 ENCOUNTER — Telehealth: Payer: Self-pay | Admitting: Cardiovascular Disease

## 2022-10-24 MED ORDER — ISOSORBIDE MONONITRATE ER 30 MG PO TB24: 30.0000 mg | ORAL_TABLET | Freq: Every day | ORAL | 0 refills | Status: DC

## 2022-10-24 NOTE — Telephone Encounter (Signed)
Requested Prescriptions   Signed Prescriptions Disp Refills   isosorbide mononitrate (IMDUR) 30 MG 24 hr tablet 90 tablet 0    Sig: Take 1 tablet (30 mg total) by mouth daily.    Authorizing Provider: Lorine Bears A    Ordering User: Guerry Minors

## 2022-10-24 NOTE — Telephone Encounter (Signed)
*  STAT* If patient is at the pharmacy, call can be transferred to refill team.   1. Which medications need to be refilled? (please list name of each medication and dose if known) Isosorbide mononitrate 30mg    2. Would you like to learn more about the convenience, safety, & potential cost savings by using the [facility]   no   3. Are you open to using the [facility] . no   4. Which pharmacy/location (including street and city if local pharmacy) is medication to be sent to? [facility] [place]   5. Do they need a 30 day or 90 day supply? 90 day

## 2022-11-05 ENCOUNTER — Encounter: Payer: Self-pay | Admitting: Cardiovascular Disease

## 2022-11-11 ENCOUNTER — Telehealth: Payer: Self-pay | Admitting: Cardiovascular Disease

## 2022-11-11 NOTE — Telephone Encounter (Signed)
Patient is asking that the nurse gives him a call. Please advise

## 2022-11-11 NOTE — Telephone Encounter (Signed)
Left voicemail message to call back  

## 2022-11-11 NOTE — Telephone Encounter (Signed)
Spoke with patient and he was inquiring about some previously completed disability forms. He stated that "Misty Stanley" helped him last time and told him she would keep blank forms if he should need them. He states it was in the last 6 months but in fact it was 09/28/21. Forms scanned were the completed portion and no blank forms available. He reports that he has a blank copy and has appointment on Friday. Advised to bring forms in to review with provider at that time. He verbalized understanding with no further questions at this time.

## 2022-11-12 ENCOUNTER — Telehealth: Payer: Self-pay

## 2022-11-12 ENCOUNTER — Ambulatory Visit: Payer: Medicare HMO | Admitting: Internal Medicine

## 2022-11-12 MED ORDER — GLIPIZIDE 10 MG PO TABS: 20.0000 mg | ORAL_TABLET | Freq: Two times a day (BID) | ORAL | 3 refills | Status: DC

## 2022-11-12 NOTE — Telephone Encounter (Signed)
Patient states that his sugars have been running in the 200-250 range for the last several weeks just doing the day. He states that it's normal range at night time.    Tresiba 22 units for the last week Glipizide 10 mg 2 BID Farxiga 10mg  once daily Actos 45mg  once daily  He has been using the reader and not his phone so last download was August 18. 2024.    I will print for review  He states that he has been watching watch he eats and has not been consuming a lot of sugar.

## 2022-11-12 NOTE — Telephone Encounter (Signed)
Patient advised and will make medication change

## 2022-11-13 ENCOUNTER — Other Ambulatory Visit: Payer: Self-pay | Admitting: Internal Medicine

## 2022-11-13 ENCOUNTER — Encounter: Payer: Self-pay | Admitting: Internal Medicine

## 2022-11-13 ENCOUNTER — Other Ambulatory Visit: Payer: Self-pay | Admitting: Cardiovascular Disease

## 2022-11-15 ENCOUNTER — Telehealth: Payer: Self-pay | Admitting: *Deleted

## 2022-11-15 ENCOUNTER — Encounter: Payer: Self-pay | Admitting: Nurse Practitioner

## 2022-11-15 ENCOUNTER — Ambulatory Visit: Payer: Medicare HMO | Attending: Nurse Practitioner | Admitting: Cardiology

## 2022-11-15 VITALS — BP 134/68 | HR 76 | Ht 66.0 in | Wt 205.2 lb

## 2022-11-15 DIAGNOSIS — Z720 Tobacco use: Secondary | ICD-10-CM

## 2022-11-15 DIAGNOSIS — E1165 Type 2 diabetes mellitus with hyperglycemia: Secondary | ICD-10-CM | POA: Diagnosis not present

## 2022-11-15 DIAGNOSIS — I10 Essential (primary) hypertension: Secondary | ICD-10-CM

## 2022-11-15 DIAGNOSIS — I493 Ventricular premature depolarization: Secondary | ICD-10-CM

## 2022-11-15 DIAGNOSIS — I25118 Atherosclerotic heart disease of native coronary artery with other forms of angina pectoris: Secondary | ICD-10-CM | POA: Diagnosis not present

## 2022-11-15 DIAGNOSIS — N1832 Chronic kidney disease, stage 3b: Secondary | ICD-10-CM | POA: Diagnosis not present

## 2022-11-15 DIAGNOSIS — E782 Mixed hyperlipidemia: Secondary | ICD-10-CM

## 2022-11-15 NOTE — Telephone Encounter (Signed)
Disability forms received and left on provider's desk to be signed.

## 2022-11-15 NOTE — Patient Instructions (Signed)
Medication Instructions:  The current medical regimen is effective;  continue present plan and medications.  *If you need a refill on your cardiac medications before your next appointment, please call your pharmacy*   Follow-Up: At [facility], you and your health needs are our priority.  As part of our continuing mission to provide you with exceptional heart care, we have created designated Provider Care Teams.  These Care Teams include your primary Cardiologist (physician) and Advanced Practice Providers (APPs -  Physician Assistants and Nurse Practitioners) who all work together to provide you with the care you need, when you need it.  We recommend signing up for the patient portal called "MyChart".  Sign up information is provided on this After Visit Summary.  MyChart is used to connect with patients for Virtual Visits (Telemedicine).  Patients are able to view lab/test results, encounter notes, upcoming appointments, etc.  Non-urgent messages can be sent to your provider as well.   To learn more about what you can do with MyChart, go to [email or web address].    Your next appointment:   6 month(s)  Provider:   Lorine Bears, MD

## 2022-11-22 NOTE — Telephone Encounter (Signed)
Paperwork has been faxed. Conformation received.   Paperwork sent to be scanned and patient aware.

## 2022-12-02 ENCOUNTER — Telehealth: Payer: Self-pay

## 2022-12-02 MED ORDER — NOVOLOG FLEXPEN 100 UNIT/ML ~~LOC~~ SOPN: 6.0000 [IU] | PEN_INJECTOR | Freq: Three times a day (TID) | SUBCUTANEOUS | 11 refills | Status: DC

## 2022-12-02 MED ORDER — BD PEN NEEDLE MINI U/F 31G X 5 MM MISC: 1.0000 | Freq: Four times a day (QID) | 3 refills | Status: DC

## 2022-12-02 NOTE — Telephone Encounter (Signed)
Patient calling regarding HBS  12/02/22  224 fasting   Egg and cheese on toast and now machine is ready Hi   12/01/22  126 fasting   366 lunch time   390 dinner time  grilled Shirmp and baked sweet potato      Farxiga 10mg   1 tab daily  Glipizide 2 tabs bid  Actos 1 tab daily

## 2022-12-02 NOTE — Telephone Encounter (Signed)
Patient advised and appointment has been scheduled

## 2022-12-09 DIAGNOSIS — E860 Dehydration: Secondary | ICD-10-CM | POA: Diagnosis not present

## 2022-12-09 DIAGNOSIS — I129 Hypertensive chronic kidney disease with stage 1 through stage 4 chronic kidney disease, or unspecified chronic kidney disease: Secondary | ICD-10-CM | POA: Diagnosis not present

## 2022-12-09 DIAGNOSIS — N189 Chronic kidney disease, unspecified: Secondary | ICD-10-CM | POA: Diagnosis not present

## 2022-12-09 DIAGNOSIS — Z794 Long term (current) use of insulin: Secondary | ICD-10-CM | POA: Diagnosis not present

## 2022-12-09 DIAGNOSIS — I2510 Atherosclerotic heart disease of native coronary artery without angina pectoris: Secondary | ICD-10-CM | POA: Diagnosis not present

## 2022-12-09 DIAGNOSIS — E1122 Type 2 diabetes mellitus with diabetic chronic kidney disease: Secondary | ICD-10-CM | POA: Diagnosis not present

## 2022-12-09 DIAGNOSIS — E7800 Pure hypercholesterolemia, unspecified: Secondary | ICD-10-CM | POA: Diagnosis not present

## 2022-12-09 DIAGNOSIS — E1165 Type 2 diabetes mellitus with hyperglycemia: Secondary | ICD-10-CM | POA: Diagnosis not present

## 2022-12-09 DIAGNOSIS — Z880 Allergy status to penicillin: Secondary | ICD-10-CM | POA: Diagnosis not present

## 2022-12-10 ENCOUNTER — Telehealth: Payer: Self-pay

## 2022-12-10 NOTE — Telephone Encounter (Signed)
Patient went to hospital yesterday for HBS. Sugar was between 500-550 yesterday and he was giving fluids due to dehydration and told to increase Novolog to 8 units TID instead of 6 units. Patient has been schedule for follow up tomorrow at 11:10 am.  Patient has reader so unable to pull up Dexcom report.

## 2022-12-10 NOTE — Telephone Encounter (Signed)
Left vm for patient to callback and provide information for HBS

## 2022-12-11 ENCOUNTER — Encounter: Payer: Self-pay | Admitting: Internal Medicine

## 2022-12-11 ENCOUNTER — Ambulatory Visit (INDEPENDENT_AMBULATORY_CARE_PROVIDER_SITE_OTHER): Payer: Medicare HMO | Admitting: Internal Medicine

## 2022-12-11 VITALS — BP 120/78 | HR 65 | Ht 66.0 in | Wt 203.0 lb

## 2022-12-11 DIAGNOSIS — E1122 Type 2 diabetes mellitus with diabetic chronic kidney disease: Secondary | ICD-10-CM

## 2022-12-11 DIAGNOSIS — E1165 Type 2 diabetes mellitus with hyperglycemia: Secondary | ICD-10-CM

## 2022-12-11 DIAGNOSIS — N184 Chronic kidney disease, stage 4 (severe): Secondary | ICD-10-CM | POA: Diagnosis not present

## 2022-12-11 DIAGNOSIS — E782 Mixed hyperlipidemia: Secondary | ICD-10-CM

## 2022-12-11 DIAGNOSIS — Z794 Long term (current) use of insulin: Secondary | ICD-10-CM

## 2022-12-11 DIAGNOSIS — E1159 Type 2 diabetes mellitus with other circulatory complications: Secondary | ICD-10-CM

## 2022-12-11 LAB — POCT GLYCOSYLATED HEMOGLOBIN (HGB A1C): Hemoglobin A1C: 10.7 % — AB (ref 4.0–5.6)

## 2022-12-11 NOTE — Patient Instructions (Signed)
-   Continue Farxiga 10 mg daily in the morning  - Continue Pioglitazone 45 mg daily  - Increased Tresiba 24 units daily  - Continue Novolog 8 units with each meal  -Novolog correctional insulin: ADD extra units on insulin to your meal-time Novolog dose if your blood sugars are higher than 160. Use the scale below to help guide you:   Blood sugar before meal Number of units to inject  Less than 160 0 unit  161 -  190 1 units  191 -  220 2 units  221 -  250 3 units  251 -  280 4 units  281 -  310 5 units  311 -  340 6 units  341 -  370 7 units  371 -  400 8 units  401 - 430 9 units  431 - 460 10 units  461 - 490 11 units   491 - 520 12 units        HOW TO TREAT LOW BLOOD SUGARS (Blood sugar LESS THAN 70 MG/DL) Please follow the RULE OF 15 for the treatment of hypoglycemia treatment (when your (blood sugars are less than 70 mg/dL)   STEP 1: Take 15 grams of carbohydrates when your blood sugar is low, which includes:  3-4 GLUCOSE TABS  OR 3-4 OZ OF JUICE OR REGULAR SODA OR ONE TUBE OF GLUCOSE GEL    STEP 2: RECHECK blood sugar in 15 MINUTES STEP 3: If your blood sugar is still low at the 15 minute recheck --> then, go back to STEP 1 and treat AGAIN with another 15 grams of carbohydrates.

## 2022-12-11 NOTE — Progress Notes (Signed)
Name: [name]  Age/ Sex: 62 y.o., male   MRN/ DOB: [id number], [birth year]     PCP: Dorothey Baseman, MD   Reason for Endocrinology Evaluation: Type 2 Diabetes Mellitus  Initial Endocrine Consultative Visit: 11/03/2019    PATIENT IDENTIFIER: [name] is a 62 y.o. male with a past medical history of T2DM, CAD, OSA and HTN.  The patient has followed with Endocrinology clinic since 11/03/2019 for consultative assistance with management of his diabetes.  DIABETIC HISTORY:  [name] was diagnosed with DM many years ago. Trulicity has caused nausea. His hemoglobin A1c has ranged from 9.0% in 12/2019, peaking at 12.1% in 10/2019.  No personal hx of pancreatitis    On his initial visit to our clinic he had an A1c of 12.1 %. He was on metformin, Glipizide and pioglitazone. We increased Glipizide     By 12/2019 reduced metformin due to low GFG, stopped by October 2023 due to diarrhea and low GFR  Stopped Glipizide and started basal insulin 06/2020 as well as Marcelline Deist    Started Trulicity in May 2023 but was unable to tolerate due to abdominal pain and stopped by October 2023  He stopped metformin due to diarrhea 12/2022 Started Ozempic 12/2022 but did not tolerate   He was started on Glipizide XL 2.5 mg daily by his PCP which results in hypoglycemia as low as 59 mg/dL    Prandial insulin was started 11/2022 due to persistent hyperglycemia  SUBJECTIVE:   During the last visit (09/30/2022): A1c 12.2%    Today (12/11/2022): [name] is here for a follow up on diabetes.  He is accompanied by his wife Junious Dresser. He checks his blood sugars occasionally . The patient has had hypoglycemic episodes since the last clinic visit.   Patient continues to follow-up with nephrology (Dr. Thedore Mins)  He continues to follow-up with cardiology  He presented to the ED 12/09/2022 with hyperglycemia  Denies dizziness Denies nausea or vomiting  Denies constipation or diarrhea   HOME ENDOCRINE   REGIMEN:  Pioglitazone 45 mg daily  Farxiga 10 mg daily in the morning  Tresiba 20 units daily  NovoLog 8 units 3 times daily before every meal  Atorvastatin 80 mg daily Fenofibrate 145 mg daily      Statin: yes ACE-I/ARB: no   METER DOWNLOAD SUMMARY:Unable to download 90 day average 348 mg/dL  259 - 563 mg/dL      CONTINUOUS GLUCOSE MONITORING RECORD INTERPRETATION    Dates of Recording: 10/10-10/23/2024  Sensor description: Dexcom  Results statistics:   CGM use % of time 71  Average and SD 264/68  Time in range 8  %  % Time Above 180 41  % Time above 250 51  % Time Below target 0   Glycemic patterns summary: BGs remain above goal overnight and worsened throughout the day Hyperglycemic episodes postprandial and overnight  Hypoglycemic episodes occurred N/A  Overnight periods: Remains high    DIABETIC COMPLICATIONS: Microvascular complications:   Denies: Denies: CKD, retinopathy, neuropathy  Last Eye Exam: Completed 01/2021  Macrovascular complications:  CAD ( S/P PCI) Denies: CVA, PVD   HISTORY:  Past Medical History:  Past Medical History:  Diagnosis Date   Bilateral hip pain    a. 05/2021 ABIs: nl bilaterally.   CKD (chronic kidney disease), stage III (HCC)    Coronary artery disease    a.  H/o NSTEMI s/p LAD/RCA stenting; b. 08/2019 PCI: RCA 99p (2.5x15 Resolute Onyx DES), 75p/m, mild ISR;  c. 09/2019 PCI: 85p - heavily calcified (intravascular lithotripsy & 3.0x26 Resolute Onyx DES); d. 10/18/19 Relook cath: stable anatomy->Med Rx;  e. 05/2021 Cath: LM 25, LAD 78m ISR, 75/70/50d, RI small - 90, LCX 50p/m/d, OM3 40, RCA 30p ISR, 20p/m ISR, 60d, RPDA 70-->Med Rx.   Diabetes mellitus without complication (HCC)    Diastolic dysfunction    a. 08/2019 Echo: EF 50-55%, no rwma, Gr1 DD, nl RV size/fxn. Triv MR.   GERD (gastroesophageal reflux disease)    Hyperlipidemia    Hypertension    NSTEMI (non-ST elevated myocardial infarction) (HCC) 08/2019    Sleep apnea    Tobacco abuse    Past Surgical History:  Past Surgical History:  Procedure Laterality Date   APPENDECTOMY     CARDIAC CATHETERIZATION     COLONOSCOPY     COLONOSCOPY WITH PROPOFOL N/A 09/16/2017   Procedure: COLONOSCOPY WITH PROPOFOL;  Surgeon: Toledo, Boykin Nearing, MD;  Location: [facility];  Service: Gastroenterology;  Laterality: N/A;  (+) DM - oral   CORONARY STENT INTERVENTION N/A 08/31/2019   Procedure: CORONARY STENT INTERVENTION;  Surgeon: Alwyn Pea, MD;  Location: [facility];  Service: Cardiovascular;  Laterality: N/A;   CORONARY STENT INTERVENTION  09/29/2019   CORONARY STENT INTERVENTION N/A 09/29/2019   Procedure: CORONARY STENT INTERVENTION;  Surgeon: Iran Ouch, MD;  Location: [facility];  Service: Cardiovascular;  Laterality: N/A;   EXPLORATORY LAPAROTOMY     EYE SURGERY     HERNIA REPAIR     LEFT HEART CATH AND CORONARY ANGIOGRAPHY N/A 08/31/2019   Procedure: LEFT HEART CATH AND CORONARY ANGIOGRAPHY possible PCI and stent;  Surgeon: Alwyn Pea, MD;  Location: [facility];  Service: Cardiovascular;  Laterality: N/A;   LEFT HEART CATH AND CORONARY ANGIOGRAPHY Left 10/18/2019   Procedure: LEFT HEART CATH AND CORONARY ANGIOGRAPHY;  Surgeon: Iran Ouch, MD;  Location: [facility];  Service: Cardiovascular;  Laterality: Left;   LEFT HEART CATH AND CORONARY ANGIOGRAPHY Left 05/25/2021   Procedure: LEFT HEART CATH AND CORONARY ANGIOGRAPHY;  Surgeon: Iran Ouch, MD;  Location: [facility];  Service: Cardiovascular;  Laterality: Left;   Social History:  reports that he has been smoking cigarettes. He has a 12.5 pack-year smoking history. He has never used smokeless tobacco. He reports that he does not drink alcohol and does not use drugs. Family History:  Family History  Problem Relation Age of Onset   Heart disease Father    Heart attack Father    Hypertension Father    Diabetes Father       HOME MEDICATIONS: Allergies as of 12/11/2022       Reactions   Shellfish Allergy Anaphylaxis   Penicillins Swelling   Childhood   Lisinopril Other (See Comments)   Hyperkalemia, increased creatinine   Erythromycin Rash, Other (See Comments)   Unknown        Medication List        Accurate as of December 11, 2022 11:11 AM. If you have any questions, ask your nurse or doctor.          Accu-Chek FastClix Lancets Misc Use as instructed to check blood sugar up to 2 times daily   Accu-Chek Softclix Lancets lancets Use to check blood sugars 2 times daily   Accu-Chek Guide Me w/Device Kit Use as instructed to check blood sugar up to 2 times daily   Accu-Chek Guide test strip Generic drug: glucose  blood USE AS INSTRUCTED TO CHECK BLOOD SUGAR UP TO 2 TIMES DAILY   Accu-Chek Guide test strip Generic drug: glucose blood 1 each by Other route in the morning and at bedtime. Use as instructed   aspirin EC 81 MG tablet Take 81 mg by mouth daily.   atorvastatin 80 MG tablet Commonly known as: LIPITOR Take 1 tablet (80 mg total) by mouth daily.   B-D UF III MINI PEN NEEDLES 31G X 5 MM Misc Generic drug: Insulin Pen Needle Inject 1 Device into the skin in the morning, at noon, in the evening, and at bedtime.   clopidogrel 75 MG tablet Commonly known as: PLAVIX TAKE 1 TABLET BY MOUTH EVERY DAY   cyanocobalamin 1000 MCG tablet Commonly known as: VITAMIN B12 Take 1,000 mcg by mouth daily.   cyclobenzaprine 5 MG tablet Commonly known as: FLEXERIL Take 1 tablet (5 mg total) by mouth 3 (three) times daily as needed for muscle spasms.   dapagliflozin propanediol 10 MG Tabs tablet Commonly known as: Farxiga Take 1 tablet (10 mg total) by mouth daily before breakfast.   Dexcom G7 Sensor Misc CHANGE SENSORS EVERY 10 DAYS   EPINEPHrine 0.3 mg/0.3 mL Soaj injection Commonly known as: EPI-PEN Inject 0.3 mg into the muscle as needed for anaphylaxis.   erythromycin  ophthalmic ointment   famotidine 20 MG tablet Commonly known as: PEPCID Take 20 mg by mouth daily.   fenofibrate 145 MG tablet Commonly known as: TRICOR Take 1 tablet (145 mg total) by mouth daily.   Fish Oil 1000 MG Caps Take 1,000 mg by mouth daily.   isosorbide mononitrate 30 MG 24 hr tablet Commonly known as: IMDUR Take 1 tablet (30 mg total) by mouth daily.   levocetirizine 5 MG tablet Commonly known as: XYZAL Take 1 tablet by mouth every evening.   lisinopril 2.5 MG tablet Commonly known as: ZESTRIL TAKE 1 TABLET BY MOUTH EVERY DAY   metoprolol succinate 25 MG 24 hr tablet Commonly known as: TOPROL-XL TAKE 1/2 TABLET BY MOUTH EVERY DAY   nitroGLYCERIN 0.4 MG SL tablet Commonly known as: NITROSTAT Place 1 tablet (0.4 mg total) under the tongue every 5 (five) minutes as needed for chest pain.   NovoLOG FlexPen 100 UNIT/ML FlexPen Generic drug: insulin aspart Inject 6 Units into the skin 3 (three) times daily with meals. What changed: how much to take   pioglitazone 45 MG tablet Commonly known as: ACTOS Take 1 tablet (45 mg total) by mouth daily.   ranolazine 500 MG 12 hr tablet Commonly known as: RANEXA TAKE 1 TABLET BY MOUTH TWICE A DAY   Tresiba FlexTouch 100 UNIT/ML FlexTouch Pen Generic drug: insulin degludec Inject 20 Units into the skin daily.   ZINC PO Take 1 capsule by mouth daily.         OBJECTIVE:   Vital Signs: BP 120/78 (BP Location: Left Arm, Patient Position: Sitting, Cuff Size: Large)   Pulse 65   Ht 5\' 6"  (1.676 m)   Wt 203 lb (92.1 kg)   SpO2 96%   BMI 32.77 kg/m   Wt Readings from Last 3 Encounters:  12/11/22 203 lb (92.1 kg)  11/15/22 205 lb 4 oz (93.1 kg)  09/30/22 211 lb (95.7 kg)     Exam: General: Pt appears well and is in NAD  Lungs: Coarse breathing sounds   Heart: RRR   Extremities: No pretibial edema.  Neuro: MS is good with appropriate affect, pt is alert and Ox3  DM foot exam: 12/11/2022   The  skin of the feet is intact without sores or ulcerations. The pedal pulses are 1+ on right and 1+ on left. The sensation is intact to a screening 5.07, 10 gram monofilament bilaterally    DATA REVIEWED:  Lab Results  Component Value Date   HGBA1C 10.7 (A) 12/11/2022   HGBA1C 12.2 (A) 09/30/2022   HGBA1C 6.3 (A) 05/10/2022  03/27/2022 Na 134 K5.3 BUN 39 Cr.  2.5 GFR 28 A1c 8.6% TG 151 HDL 34.8 LDL 63   ASSESSMENT / PLAN / RECOMMENDATIONS:   1) Type 2 Diabetes Mellitus, With CKD IV and macrovascular  complications - Most recent A1c of 10.7  %. Goal A1c < 7.0.    -Patient continues with persistent hyperglycemia -In reviewing his CGM data, patient has been noted with hyperglycemia overnight as well as during the day, this morning he forgot to take NovoLog with breakfast?.  Patient encouraged to take NovoLog before each meal and use a correction scale that will be provided today to add additional units of NovoLog -I will also increase his basal insulin as below -He is intolerant to metformin due to diarrhea -He is intolerant to Trulicity and Ozempic due to abdominal pain -I have offered a referral to our CDE, but he will let me know if he is interested in this -Patient advised to avoid sugar sweetened beverages, discussed low-carb snack options if necessary   MEDICATIONS:  - Continue Farxiga 10 mg daily in the morning  - Continue Pioglitazone 45 mg daily  -Increase Tresiba to 24 units daily -Continue NovoLog 8 units 3 times daily before every meal -Start CF: NovoLog (BG -130/30) TIDQAC and QHS   EDUCATION / INSTRUCTIONS: BG monitoring instructions: Patient is instructed to check his blood sugars 2 times a day, before breakfast and supper. Call [facility] if: BG persistently < 70  I reviewed the Rule of 15 for the treatment of hypoglycemia in detail with the patient. Literature supplied.     2) Diabetic complications:  Eye: Does not have known  diabetic retinopathy.  Neuro/ Feet: Does not have known diabetic peripheral neuropathy .  Renal: Patient does not have known baseline CKD. He   is not a candidate for  ACEI/ARB at present due to hyperkalemia . MA/Cr ratio is normal    3) Mixed Dyslipidemia:    -TG improved from 935 in 11/2020 , to 177 mg/DL 16/1096 -LDL at goal -Will not check today until his glycemic control improves -No changes at this time, patient assures me compliance  Medication Continue atorvastatin 80 mg daily Continue fenofibrate 145 mg daily   F/U in 3 months   Signed electronically by: Lyndle Herrlich, MD  [facility]  [facility] [address] [place] [place], [place] [address] Phone: [phone] FAX: [phone]   CC: Dorothey Baseman, MD [address] [place] [address] Phone: [phone]  Fax: [phone]  Return to Endocrinology clinic as below: Future Appointments  Date Time Provider Department Center  03/06/2023  8:10 AM Nema Oatley, Konrad Dolores, MD [id number] None

## 2022-12-12 ENCOUNTER — Encounter: Payer: Self-pay | Admitting: Internal Medicine

## 2022-12-15 DIAGNOSIS — E1159 Type 2 diabetes mellitus with other circulatory complications: Secondary | ICD-10-CM | POA: Diagnosis not present

## 2022-12-18 DIAGNOSIS — E119 Type 2 diabetes mellitus without complications: Secondary | ICD-10-CM | POA: Diagnosis not present

## 2022-12-18 DIAGNOSIS — I10 Essential (primary) hypertension: Secondary | ICD-10-CM | POA: Diagnosis not present

## 2022-12-18 DIAGNOSIS — I2510 Atherosclerotic heart disease of native coronary artery without angina pectoris: Secondary | ICD-10-CM | POA: Diagnosis not present

## 2022-12-18 DIAGNOSIS — M79641 Pain in right hand: Secondary | ICD-10-CM | POA: Diagnosis not present

## 2022-12-18 DIAGNOSIS — M25531 Pain in right wrist: Secondary | ICD-10-CM | POA: Diagnosis not present

## 2022-12-18 DIAGNOSIS — G5601 Carpal tunnel syndrome, right upper limb: Secondary | ICD-10-CM | POA: Diagnosis not present

## 2022-12-18 DIAGNOSIS — E7800 Pure hypercholesterolemia, unspecified: Secondary | ICD-10-CM | POA: Diagnosis not present

## 2022-12-18 DIAGNOSIS — Z7982 Long term (current) use of aspirin: Secondary | ICD-10-CM | POA: Diagnosis not present

## 2022-12-18 DIAGNOSIS — Z79899 Other long term (current) drug therapy: Secondary | ICD-10-CM | POA: Diagnosis not present

## 2022-12-18 DIAGNOSIS — I252 Old myocardial infarction: Secondary | ICD-10-CM | POA: Diagnosis not present

## 2022-12-18 DIAGNOSIS — F17210 Nicotine dependence, cigarettes, uncomplicated: Secondary | ICD-10-CM | POA: Diagnosis not present

## 2022-12-23 ENCOUNTER — Ambulatory Visit: Payer: Medicare HMO | Admitting: Internal Medicine

## 2022-12-25 ENCOUNTER — Other Ambulatory Visit: Payer: Self-pay | Admitting: Nurse Practitioner

## 2022-12-25 NOTE — Telephone Encounter (Signed)
last visit: 11/15/22 with plan to f/u in 6 months. Next visit: none/active recall

## 2023-01-06 ENCOUNTER — Other Ambulatory Visit (HOSPITAL_COMMUNITY): Payer: Self-pay

## 2023-01-14 ENCOUNTER — Telehealth: Payer: Self-pay

## 2023-01-14 MED ORDER — TRESIBA FLEXTOUCH 100 UNIT/ML ~~LOC~~ SOPN: 30.0000 [IU] | PEN_INJECTOR | Freq: Every day | SUBCUTANEOUS | 3 refills | Status: DC

## 2023-01-14 NOTE — Telephone Encounter (Signed)
Patient aware and will make medication changes

## 2023-01-14 NOTE — Telephone Encounter (Signed)
Patient states that his BS has been between 200-400 from time he wakes up to time he goes to bed.  01/14/23   300 as soon as he woke up  341  12:24pm  Patient has only had diet green tea today.    Tresiba 24 units  Novolog 8 wc meals plus his sliding scale  Farxiga and Actos  Patient states that he has been taking medications correctly with no improvement.   He has the [facility] receiver so can't download.

## 2023-01-23 ENCOUNTER — Telehealth: Payer: Self-pay

## 2023-01-24 MED ORDER — TRESIBA FLEXTOUCH 100 UNIT/ML ~~LOC~~ SOPN: 40.0000 [IU] | PEN_INJECTOR | Freq: Every day | SUBCUTANEOUS | 3 refills | Status: DC

## 2023-01-24 MED ORDER — NOVOLOG FLEXPEN 100 UNIT/ML ~~LOC~~ SOPN: PEN_INJECTOR | SUBCUTANEOUS | 3 refills | Status: DC

## 2023-01-24 NOTE — Telephone Encounter (Signed)
Patient states that sugar are running right around the 200 mark all day. He has been using more Novolog daily, hr has been doing 12 with meals and using the sliding scale. He is unable to pick up more Novolog because pharmacy saying too soon. Would like an increase prescription sent in.

## 2023-01-24 NOTE — Telephone Encounter (Signed)
Patient aware and will make medication adjustments

## 2023-01-27 DIAGNOSIS — E1122 Type 2 diabetes mellitus with diabetic chronic kidney disease: Secondary | ICD-10-CM | POA: Diagnosis not present

## 2023-01-27 DIAGNOSIS — N1832 Chronic kidney disease, stage 3b: Secondary | ICD-10-CM | POA: Diagnosis not present

## 2023-01-27 DIAGNOSIS — I10 Essential (primary) hypertension: Secondary | ICD-10-CM | POA: Diagnosis not present

## 2023-01-27 DIAGNOSIS — D631 Anemia in chronic kidney disease: Secondary | ICD-10-CM | POA: Diagnosis not present

## 2023-03-06 ENCOUNTER — Encounter: Payer: Self-pay | Admitting: Internal Medicine

## 2023-03-06 ENCOUNTER — Ambulatory Visit: Payer: Medicare PPO | Admitting: Internal Medicine

## 2023-03-06 ENCOUNTER — Telehealth: Payer: Self-pay

## 2023-03-06 VITALS — BP 122/80 | HR 65 | Ht 66.0 in | Wt 206.6 lb

## 2023-03-06 DIAGNOSIS — N184 Chronic kidney disease, stage 4 (severe): Secondary | ICD-10-CM

## 2023-03-06 DIAGNOSIS — E782 Mixed hyperlipidemia: Secondary | ICD-10-CM | POA: Diagnosis not present

## 2023-03-06 DIAGNOSIS — E1122 Type 2 diabetes mellitus with diabetic chronic kidney disease: Secondary | ICD-10-CM

## 2023-03-06 DIAGNOSIS — E1159 Type 2 diabetes mellitus with other circulatory complications: Secondary | ICD-10-CM | POA: Diagnosis not present

## 2023-03-06 DIAGNOSIS — Z794 Long term (current) use of insulin: Secondary | ICD-10-CM | POA: Diagnosis not present

## 2023-03-06 DIAGNOSIS — E1165 Type 2 diabetes mellitus with hyperglycemia: Secondary | ICD-10-CM | POA: Diagnosis not present

## 2023-03-06 LAB — POCT GLYCOSYLATED HEMOGLOBIN (HGB A1C): Hemoglobin A1C: 11.7 % — AB (ref 4.0–5.6)

## 2023-03-06 MED ORDER — NOVOLOG FLEXPEN 100 UNIT/ML ~~LOC~~ SOPN: PEN_INJECTOR | SUBCUTANEOUS | 6 refills | Status: DC

## 2023-03-06 MED ORDER — ATORVASTATIN CALCIUM 80 MG PO TABS: 80.0000 mg | ORAL_TABLET | Freq: Every day | ORAL | 3 refills | Status: DC

## 2023-03-06 MED ORDER — TRESIBA FLEXTOUCH 100 UNIT/ML ~~LOC~~ SOPN: 44.0000 [IU] | PEN_INJECTOR | Freq: Every day | SUBCUTANEOUS | 3 refills | Status: DC

## 2023-03-06 MED ORDER — FENOFIBRATE 145 MG PO TABS: 145.0000 mg | ORAL_TABLET | Freq: Every day | ORAL | 3 refills | Status: DC

## 2023-03-06 MED ORDER — BD PEN NEEDLE MINI U/F 31G X 5 MM MISC: 1.0000 | Freq: Four times a day (QID) | 3 refills | Status: DC

## 2023-03-06 MED ORDER — PIOGLITAZONE HCL 45 MG PO TABS: 45.0000 mg | ORAL_TABLET | Freq: Every day | ORAL | 3 refills | Status: DC

## 2023-03-06 MED ORDER — TIRZEPATIDE 5 MG/0.5ML ~~LOC~~ SOAJ: 5.0000 mg | SUBCUTANEOUS | 3 refills | Status: DC

## 2023-03-06 NOTE — Telephone Encounter (Signed)
Medication Samples have been provided to the patient.  Drug name: Greggory Keen       Strength: 2.5mg         Qty: 1 box   LOT: [id number]  Exp.Date: 08/24/24  Dosing instructions: 2.5mg  once weekly  The patient has been instructed regarding the correct time, dose, and frequency of taking this medication, including desired effects and most common side effects.   Yaretsi Humphres L Zahrah Sutherlin 8:46 AM 03/06/2023

## 2023-03-06 NOTE — Patient Instructions (Addendum)
-   Start Mounjaro 2.5 mg weekly for a month , than increase 5 mg once weekly  - Continue Farxiga 10 mg daily in the morning  - Continue Pioglitazone 45 mg daily  - Increased Tresiba 44 units daily  - Increase Novolog 16 units with each meal  -Novolog correctional insulin: ADD extra units on insulin to your meal-time Novolog dose if your blood sugars are higher than 160. Use the scale below to help guide you:   Blood sugar before meal Number of units to inject  Less than 160 0 unit  161 -  190 1 units  191 -  220 2 units  221 -  250 3 units  251 -  280 4 units  281 -  310 5 units  311 -  340 6 units  341 -  370 7 units  371 -  400 8 units  401 - 430 9 units  431 - 460 10 units  461 - 490 11 units   491 - 520 12 units        HOW TO TREAT LOW BLOOD SUGARS (Blood sugar LESS THAN 70 MG/DL) Please follow the RULE OF 15 for the treatment of hypoglycemia treatment (when your (blood sugars are less than 70 mg/dL)   STEP 1: Take 15 grams of carbohydrates when your blood sugar is low, which includes:  3-4 GLUCOSE TABS  OR 3-4 OZ OF JUICE OR REGULAR SODA OR ONE TUBE OF GLUCOSE GEL    STEP 2: RECHECK blood sugar in 15 MINUTES STEP 3: If your blood sugar is still low at the 15 minute recheck --> then, go back to STEP 1 and treat AGAIN with another 15 grams of carbohydrates.

## 2023-03-06 NOTE — Progress Notes (Signed)
Name: [name]  Age/ Sex: 63 y.o., male   MRN/ DOB: [id number], [birth year]     PCP: Dorothey Baseman, MD   Reason for Endocrinology Evaluation: Type 2 Diabetes Mellitus  Initial Endocrine Consultative Visit: 11/03/2019    PATIENT IDENTIFIER: Mr. Jeff HALLIWELL is a 63 y.o. male with a past medical history of T2DM, CAD, OSA and HTN.  The patient has followed with Endocrinology clinic since 11/03/2019 for consultative assistance with management of his diabetes.  DIABETIC HISTORY:  Mr. Roeth was diagnosed with DM many years ago. Trulicity has caused nausea. His hemoglobin A1c has ranged from 9.0% in 12/2019, peaking at 12.1% in 10/2019.  No personal hx of pancreatitis    On his initial visit to our clinic he had an A1c of 12.1 %. He was on metformin, Glipizide and pioglitazone. We increased Glipizide     By 12/2019 reduced metformin due to low GFG, stopped by October 2023 due to diarrhea and low GFR  Stopped Glipizide and started basal insulin 06/2020 as well as Marcelline Deist    Started Trulicity in May 2023 but was unable to tolerate due to abdominal pain and stopped by October 2023  He stopped metformin due to diarrhea 12/2022 Started Ozempic 12/2022 but did not tolerate   He was started on Glipizide XL 2.5 mg daily by his PCP which results in hypoglycemia as low as 59 mg/dL   Mounjaro started 09/4694   Prandial insulin was started 11/2022 due to persistent hyperglycemia  SUBJECTIVE:   During the last visit (12/11/2022): A1c 10.7%    Today (03/06/2023): [name] is here for a follow up on diabetes.  He is accompanied by his wife Junious Dresser. He checks his blood sugars occasionally . The patient has had hypoglycemic episodes since the last clinic visit.   Patient continues to follow-up with nephrology (Dr. Thedore Mins with [facility]) He continues to follow-up with cardiology  Denies nausea or vomiting  Denies constipation or diarrhea   HOME ENDOCRINE  REGIMEN:  Pioglitazone  45 mg daily  Farxiga 10 mg daily in the morning  Tresiba 40 units daily  NovoLog 12 units 3 times daily before every meal  Atorvastatin 80 mg daily Fenofibrate 145 mg daily      Statin: yes ACE-I/ARB: no    CONTINUOUS GLUCOSE MONITORING RECORD INTERPRETATION    Dates of Recording:1/3-1/16/2024  Sensor description: Dexcom  Results statistics:   CGM use % of time 79  Average and SD 298/72  Time in range 5 %  % Time Above 180 17  % Time above 250 78  % Time Below target 0   Glycemic patterns summary: BGs remain above goal overnight and worsened throughout the day Hyperglycemic episodes postprandial and overnight  Hypoglycemic episodes occurred N/A  Overnight periods: Remains high    DIABETIC COMPLICATIONS: Microvascular complications:   Denies: Denies: CKD, retinopathy, neuropathy  Last Eye Exam: Completed 01/2021  Macrovascular complications:  CAD ( S/P PCI) Denies: CVA, PVD   HISTORY:  Past Medical History:  Past Medical History:  Diagnosis Date   Bilateral hip pain    a. 05/2021 ABIs: nl bilaterally.   CKD (chronic kidney disease), stage III (HCC)    Coronary artery disease    a.  H/o NSTEMI s/p LAD/RCA stenting; b. 08/2019 PCI: RCA 99p (2.5x15 Resolute Onyx DES), 75p/m, mild ISR; c. 09/2019 PCI: 85p - heavily calcified (intravascular lithotripsy & 3.0x26 Resolute Onyx DES); d. 10/18/19 Relook cath: stable anatomy->Med Rx;  e. 05/2021 Cath: Marcello Fennel  25, LAD 39m ISR, 75/70/50d, RI small - 90, LCX 50p/m/d, OM3 40, RCA 30p ISR, 20p/m ISR, 60d, RPDA 70-->Med Rx.   Diabetes mellitus without complication (HCC)    Diastolic dysfunction    a. 08/2019 Echo: EF 50-55%, no rwma, Gr1 DD, nl RV size/fxn. Triv MR.   GERD (gastroesophageal reflux disease)    Hyperlipidemia    Hypertension    NSTEMI (non-ST elevated myocardial infarction) (HCC) 08/2019   Sleep apnea    Tobacco abuse    Past Surgical History:  Past Surgical History:  Procedure Laterality Date    APPENDECTOMY     CARDIAC CATHETERIZATION     COLONOSCOPY     COLONOSCOPY WITH PROPOFOL N/A 09/16/2017   Procedure: COLONOSCOPY WITH PROPOFOL;  Surgeon: [place], Boykin Nearing, MD;  Location: [facility] ENDOSCOPY;  Service: Gastroenterology;  Laterality: N/A;  (+) DM - oral   CORONARY STENT INTERVENTION N/A 08/31/2019   Procedure: CORONARY STENT INTERVENTION;  Surgeon: Alwyn Pea, MD;  Location: [facility] INVASIVE CV LAB;  Service: Cardiovascular;  Laterality: N/A;   CORONARY STENT INTERVENTION  09/29/2019   CORONARY STENT INTERVENTION N/A 09/29/2019   Procedure: CORONARY STENT INTERVENTION;  Surgeon: Iran Ouch, MD;  Location: [facility] CV [facility];  Service: Cardiovascular;  Laterality: N/A;   EXPLORATORY LAPAROTOMY     EYE SURGERY     HERNIA REPAIR     LEFT HEART CATH AND CORONARY ANGIOGRAPHY N/A 08/31/2019   Procedure: LEFT HEART CATH AND CORONARY ANGIOGRAPHY possible PCI and stent;  Surgeon: Alwyn Pea, MD;  Location: [facility] INVASIVE CV LAB;  Service: Cardiovascular;  Laterality: N/A;   LEFT HEART CATH AND CORONARY ANGIOGRAPHY Left 10/18/2019   Procedure: LEFT HEART CATH AND CORONARY ANGIOGRAPHY;  Surgeon: Iran Ouch, MD;  Location: [facility] INVASIVE CV LAB;  Service: Cardiovascular;  Laterality: Left;   LEFT HEART CATH AND CORONARY ANGIOGRAPHY Left 05/25/2021   Procedure: LEFT HEART CATH AND CORONARY ANGIOGRAPHY;  Surgeon: Iran Ouch, MD;  Location: [facility] INVASIVE CV LAB;  Service: Cardiovascular;  Laterality: Left;   Social History:  reports that he has been smoking cigarettes. He has a 12.5 pack-year smoking history. He has never used smokeless tobacco. He reports that he does not drink alcohol and does not use drugs. Family History:  Family History  Problem Relation Age of Onset   Heart disease Father    Heart attack Father    Hypertension Father    Diabetes Father      HOME MEDICATIONS: Allergies as of 03/06/2023       Reactions   Shellfish Allergy Anaphylaxis    Penicillins Swelling   Childhood   Lisinopril Other (See Comments)   Hyperkalemia, increased creatinine   Erythromycin Rash, Other (See Comments)   Unknown        Medication List        Accurate as of March 06, 2023  8:21 AM. If you have any questions, ask your nurse or doctor.          Accu-Chek FastClix Lancets Misc Use as instructed to check blood sugar up to 2 times daily   Accu-Chek Softclix Lancets lancets Use to check blood sugars 2 times daily   Accu-Chek Guide Me w/Device Kit Use as instructed to check blood sugar up to 2 times daily   Accu-Chek Guide test strip Generic drug: glucose blood USE AS INSTRUCTED TO CHECK BLOOD SUGAR UP TO 2 TIMES DAILY   Accu-Chek Guide test strip Generic drug: glucose blood 1 each  by Other route in the morning and at bedtime. Use as instructed   aspirin EC 81 MG tablet Take 81 mg by mouth daily.   atorvastatin 80 MG tablet Commonly known as: LIPITOR Take 1 tablet (80 mg total) by mouth daily.   B-D UF III MINI PEN NEEDLES 31G X 5 MM Misc Generic drug: Insulin Pen Needle Inject 1 Device into the skin in the morning, at noon, in the evening, and at bedtime.   clopidogrel 75 MG tablet Commonly known as: PLAVIX TAKE 1 TABLET BY MOUTH EVERY DAY   cyanocobalamin 1000 MCG tablet Commonly known as: VITAMIN B12 Take 1,000 mcg by mouth daily.   cyclobenzaprine 5 MG tablet Commonly known as: FLEXERIL Take 1 tablet (5 mg total) by mouth 3 (three) times daily as needed for muscle spasms.   dapagliflozin propanediol 10 MG Tabs tablet Commonly known as: Farxiga Take 1 tablet (10 mg total) by mouth daily before breakfast.   Dexcom G7 Sensor Misc CHANGE SENSORS EVERY 10 DAYS   EPINEPHrine 0.3 mg/0.3 mL Soaj injection Commonly known as: EPI-PEN Inject 0.3 mg into the muscle as needed for anaphylaxis.   erythromycin ophthalmic ointment   famotidine 20 MG tablet Commonly known as: PEPCID Take 20 mg by mouth daily.    fenofibrate 145 MG tablet Commonly known as: TRICOR Take 1 tablet (145 mg total) by mouth daily.   Fish Oil 1000 MG Caps Take 1,000 mg by mouth daily.   isosorbide mononitrate 30 MG 24 hr tablet Commonly known as: IMDUR Take 1 tablet (30 mg total) by mouth daily.   levocetirizine 5 MG tablet Commonly known as: XYZAL Take 1 tablet by mouth every evening.   lisinopril 2.5 MG tablet Commonly known as: ZESTRIL TAKE 1 TABLET BY MOUTH EVERY DAY   metoprolol succinate 25 MG 24 hr tablet Commonly known as: TOPROL-XL TAKE 1/2 TABLET BY MOUTH DAILY   nitroGLYCERIN 0.4 MG SL tablet Commonly known as: NITROSTAT Place 1 tablet (0.4 mg total) under the tongue every 5 (five) minutes as needed for chest pain.   NovoLOG FlexPen 100 UNIT/ML FlexPen Generic drug: insulin aspart Max daily 45 units   oxyCODONE 5 MG immediate release tablet Commonly known as: Oxy IR/ROXICODONE Take 5 mg by mouth every 8 (eight) hours as needed.   pioglitazone 45 MG tablet Commonly known as: ACTOS Take 1 tablet (45 mg total) by mouth daily.   ranolazine 500 MG 12 hr tablet Commonly known as: RANEXA TAKE 1 TABLET BY MOUTH TWICE A DAY   Tresiba FlexTouch 100 UNIT/ML FlexTouch Pen Generic drug: insulin degludec Inject 40 Units into the skin daily.   ZINC PO Take 1 capsule by mouth daily.         OBJECTIVE:   Vital Signs: BP 122/80 (BP Location: Left Arm, Patient Position: Sitting, Cuff Size: Normal)   Pulse 65   Ht 5\' 6"  (1.676 m)   Wt 206 lb 9.6 oz (93.7 kg)   SpO2 97%   BMI 33.35 kg/m   Wt Readings from Last 3 Encounters:  03/06/23 206 lb 9.6 oz (93.7 kg)  12/11/22 203 lb (92.1 kg)  11/15/22 205 lb 4 oz (93.1 kg)     Exam: General: Pt appears well and is in NAD  Lungs: Coarse breathing sounds   Heart: RRR   Extremities: No pretibial edema.  Neuro: MS is good with appropriate affect, pt is alert and Ox3       DM foot exam: 12/11/2022   The skin  of the feet is intact without  sores or ulcerations. The pedal pulses are 1+ on right and 1+ on left. The sensation is intact to a screening 5.07, 10 gram monofilament bilaterally    DATA REVIEWED:  Lab Results  Component Value Date   HGBA1C 10.7 (A) 12/11/2022   HGBA1C 12.2 (A) 09/30/2022   HGBA1C 6.3 (A) 05/10/2022     12/11/2022 through nephrology   Cr.  1.9 GFR 39    ASSESSMENT / PLAN / RECOMMENDATIONS:   1) Type 2 Diabetes Mellitus, With CKD IV and macrovascular  complications - Most recent A1c of 11.7 %. Goal A1c < 7.0.   -Patient continues with worsening glycemic control despite escalating insulin doses -He is intolerant to metformin due to diarrhea -He is intolerant to Trulicity and Ozempic due to abdominal pain -I have recommended Mounjaro -Will increase NovoLog and Tresiba as below -He was advised that he may use the correction scale before each meal as well as bedtime   MEDICATIONS: -Start Mounjaro 2.5 mg weekly for 1 month, then increase to 5 mg weekly -Continue Farxiga 10 mg daily in the morning  -Continue Pioglitazone 45 mg daily  -Increase Tresiba 44 units daily -Increase NovoLog 16 units 3 times daily before every meal -Continue CF: NovoLog (BG -130/30) TIDQAC and QHS   EDUCATION / INSTRUCTIONS: BG monitoring instructions: Patient is instructed to check his blood sugars 4 times a day, before breakfast and supper. Call [facility] if: BG persistently < 70  I reviewed the Rule of 15 for the treatment of hypoglycemia in detail with the patient. Literature supplied.     2) Diabetic complications:  Eye: Does not have known diabetic retinopathy.  Neuro/ Feet: Does not have known diabetic peripheral neuropathy .  Renal: Patient does not have known baseline CKD. He   is not a candidate for  ACEI/ARB at present due to hyperkalemia . MA/Cr ratio is normal    3) Mixed Dyslipidemia:    -TG improved from 935 in 11/2020 , to 177 mg/DL 05/5407 -LDL at goal -Will not  check today until his glycemic control improves   Medication Continue atorvastatin 80 mg daily Continue fenofibrate 145 mg daily   F/U in 3 months   Signed electronically by: Lyndle Herrlich, MD  [facility]  [facility] [address] [place] [place], [place] [address] Phone: [phone] FAX: [phone]   CC: Dorothey Baseman, MD [address] [place] [address] Phone: [phone]  Fax: [phone]  Return to Endocrinology clinic as below: No future appointments.

## 2023-03-09 ENCOUNTER — Other Ambulatory Visit: Payer: Self-pay | Admitting: Cardiovascular Disease

## 2023-03-14 ENCOUNTER — Telehealth: Payer: Self-pay | Admitting: Cardiovascular Disease

## 2023-03-14 NOTE — Telephone Encounter (Signed)
Dr. Kirke Corin  We have received a surgical clearance request for [name] for colonoscopy.  He has a PMH of NSTEMI with DES to LAD and mid RCA in 08/2019 and in-stent restenosis to RCA in 2023.  Per protocol due to patient's in-stent restenosis and location of previous stents clearance for holding Plavix will need to come from primary cardiologist.  Can you please comment on guidance for holding Plavix prior to scheduled colonoscopy. Please forward you guidance and recommendations to [facility] PREOP   Thanks, Robin Searing, NP

## 2023-03-14 NOTE — Telephone Encounter (Signed)
   Pre-operative Risk Assessment    Patient Name: [name]  DOB: September 10, 1960 MRN: [id number]   Date of last office visit: 11/15/22 Date of next office visit: n/a   Request for Surgical Clearance    Procedure:  Colonoscopy  Date of Surgery:  Clearance 04/29/23                                Surgeon:  not indicated Surgeon's Group or Practice Name:  [facility] Phone number:  [phone] Fax number:  [phone]   Type of Clearance Requested:   - Pharmacy:  Hold Clopidogrel (Plavix)   hold 5days   Type of Anesthesia:  Not Indicated   Additional requests/questions:    SignedNarda Amber   03/14/2023, 3:49 PM

## 2023-03-14 NOTE — Telephone Encounter (Signed)
Hold Plavix 5 days before.

## 2023-03-18 NOTE — Telephone Encounter (Signed)
Notes faxed to surgeon. This phone note will be removed from the preop pool. Tereso Newcomer, PA-C  03/18/2023 8:21 AM

## 2023-03-18 NOTE — Telephone Encounter (Signed)
   Patient Name: [name]  DOB: [birth year] MRN: [id number]  Primary Cardiologist: Lorine Bears, MD  Please see notes from Dr. Kirke Corin. The patient may hold Clopidogrel (Plavix) for 5 days prior to his procedure and resume as soon as possible post op. He should remain on ASA without interruption  I will route this recommendation to the requesting party via Epic fax function and remove from pre-op pool.  Please call with questions.  Tereso Newcomer, PA-C 03/18/2023, 8:18 AM

## 2023-03-24 ENCOUNTER — Telehealth: Payer: Self-pay

## 2023-03-24 NOTE — Telephone Encounter (Signed)
Patient states that his blood sugar have been dropping between 40- 60 range at different times of the day. This morning his sugar was 99 fasting and now its 60.  He cut his Tresiba to 24 units and he is doing 91 of the the Novolog. He states that this has been going on for a week. Also doing the [place] and the  [place].  He has not been doing finger stick to make sure it's a true low.

## 2023-03-24 NOTE — Telephone Encounter (Signed)
Pt has been notified and voices understanding.  

## 2023-04-16 ENCOUNTER — Telehealth: Payer: Self-pay | Admitting: Cardiovascular Disease

## 2023-04-16 NOTE — Telephone Encounter (Signed)
   Patient Name: [name]  DOB: January 03, 1961 MRN: [id number]  Primary Cardiologist: Lorine Bears, MD  Chart reviewed as part of pre-operative protocol coverage.   Per Dr. Kirke Corin, pt may hold Plavix for 5 days prior to procedure. Please resume Plavix as soon as possible postprocedure, at the discretion of the surgeon.   I will route this recommendation to the requesting party via Epic fax function and remove from pre-op pool.  Please call with questions.  Joylene Grapes, NP 04/16/2023, 3:59 PM

## 2023-04-16 NOTE — Telephone Encounter (Signed)
   Pre-operative Risk Assessment    Patient Name: [name]  DOB: 04-21-1960 MRN: [id number]   Date of last office visit: unknown Date of next office visit: unknown   Request for Surgical Clearance    Procedure:   colonscopy  Date of Surgery:  Clearance 04/29/23                                Surgeon:  Dr Gareth Morgan Group or Practice Name:  [facility] Phone number:  [phone] Fax number:  [phone]   Type of Clearance Requested:   - Pharmacy:  Hold Clopidogrel (Plavix) follow instructions   Type of Anesthesia:  Not Indicated   Additional requests/questions:    Burnett Sheng   04/16/2023, 3:49 PM

## 2023-04-17 ENCOUNTER — Telehealth: Payer: Self-pay | Admitting: Cardiovascular Disease

## 2023-04-17 NOTE — Telephone Encounter (Signed)
   Pre-operative Risk Assessment    Patient Name: [name]  DOB: 1961/02/15 MRN: [id number]   Date of last office visit: 10/26/22 Date of next office visit: n/a   Request for Surgical Clearance    Procedure:  Colonoscopy  Date of Surgery:  Clearance 04/29/23                                Surgeon:  not indicated Surgeon's Group or Practice Name:  [facility] Phone number:  [phone] Fax number:  [phone]   Type of Clearance Requested:  Pharmacy, Plavix    Type of Anesthesia:  Not Indicated   Additional requests/questions:    Signed, Narda Amber   04/17/2023, 1:18 PM

## 2023-04-17 NOTE — Telephone Encounter (Signed)
   Patient Name: [name]  DOB: March 22, 1960 MRN: [id number]  Primary Cardiologist: Lorine Bears, MD  Chart reviewed as part of pre-operative protocol coverage.   Per Dr. Kirke Corin, pt may hold Plavix for 5 days prior to procedure. Please resume Plavix as soon as possible postprocedure, at the discretion of the surgeon.   I will route this recommendation to the requesting party via Epic fax function and remove from pre-op pool.  Please call with questions.  Joylene Grapes, NP 04/17/2023, 1:34 PM

## 2023-04-18 ENCOUNTER — Encounter: Payer: Self-pay | Admitting: Internal Medicine

## 2023-04-28 ENCOUNTER — Encounter: Payer: Self-pay | Admitting: Internal Medicine

## 2023-04-29 ENCOUNTER — Encounter
Admission: RE | Disposition: A | Discharge status: Home / Self Care | Payer: Self-pay | Source: Home / Self Care | Attending: Internal Medicine

## 2023-04-29 ENCOUNTER — Ambulatory Visit: Admitting: Anesthesiology

## 2023-04-29 ENCOUNTER — Other Ambulatory Visit: Payer: Self-pay

## 2023-04-29 ENCOUNTER — Encounter: Payer: Self-pay | Admitting: Internal Medicine

## 2023-04-29 ENCOUNTER — Ambulatory Visit
Admission: RE | Admit: 2023-04-29 | Discharge: 2023-04-29 | Disposition: A | Discharge status: Home / Self Care | Payer: Medicare PPO | Attending: Internal Medicine | Admitting: Internal Medicine

## 2023-04-29 DIAGNOSIS — E1122 Type 2 diabetes mellitus with diabetic chronic kidney disease: Secondary | ICD-10-CM | POA: Diagnosis not present

## 2023-04-29 DIAGNOSIS — Z7984 Long term (current) use of oral hypoglycemic drugs: Secondary | ICD-10-CM | POA: Insufficient documentation

## 2023-04-29 DIAGNOSIS — K635 Polyp of colon: Secondary | ICD-10-CM | POA: Insufficient documentation

## 2023-04-29 DIAGNOSIS — Z1211 Encounter for screening for malignant neoplasm of colon: Secondary | ICD-10-CM | POA: Diagnosis present

## 2023-04-29 DIAGNOSIS — Z7902 Long term (current) use of antithrombotics/antiplatelets: Secondary | ICD-10-CM | POA: Insufficient documentation

## 2023-04-29 DIAGNOSIS — K640 First degree hemorrhoids: Secondary | ICD-10-CM | POA: Diagnosis not present

## 2023-04-29 DIAGNOSIS — N183 Chronic kidney disease, stage 3 unspecified: Secondary | ICD-10-CM | POA: Diagnosis not present

## 2023-04-29 DIAGNOSIS — Z79899 Other long term (current) drug therapy: Secondary | ICD-10-CM | POA: Diagnosis not present

## 2023-04-29 DIAGNOSIS — I252 Old myocardial infarction: Secondary | ICD-10-CM | POA: Insufficient documentation

## 2023-04-29 DIAGNOSIS — Z7982 Long term (current) use of aspirin: Secondary | ICD-10-CM | POA: Insufficient documentation

## 2023-04-29 DIAGNOSIS — G4730 Sleep apnea, unspecified: Secondary | ICD-10-CM | POA: Insufficient documentation

## 2023-04-29 DIAGNOSIS — Z794 Long term (current) use of insulin: Secondary | ICD-10-CM | POA: Diagnosis not present

## 2023-04-29 DIAGNOSIS — I2510 Atherosclerotic heart disease of native coronary artery without angina pectoris: Secondary | ICD-10-CM | POA: Insufficient documentation

## 2023-04-29 DIAGNOSIS — I25119 Atherosclerotic heart disease of native coronary artery with unspecified angina pectoris: Secondary | ICD-10-CM | POA: Diagnosis not present

## 2023-04-29 DIAGNOSIS — I129 Hypertensive chronic kidney disease with stage 1 through stage 4 chronic kidney disease, or unspecified chronic kidney disease: Secondary | ICD-10-CM | POA: Insufficient documentation

## 2023-04-29 HISTORY — PX: POLYPECTOMY: SHX5525

## 2023-04-29 HISTORY — PX: COLONOSCOPY WITH PROPOFOL: SHX5780

## 2023-04-29 HISTORY — DX: Chronic kidney disease, unspecified: N18.9

## 2023-04-29 HISTORY — DX: Chronic metabolic acidosis: E87.22

## 2023-04-29 LAB — GLUCOSE, CAPILLARY: Glucose-Capillary: 94 mg/dL (ref 70–99)

## 2023-04-29 SURGERY — COLONOSCOPY WITH PROPOFOL
Anesthesia: General

## 2023-04-29 MED ORDER — SODIUM CHLORIDE 0.9 % IV SOLN: INTRAVENOUS | Status: DC

## 2023-04-29 MED ORDER — EPHEDRINE SULFATE-NACL 50-0.9 MG/10ML-% IV SOSY
PREFILLED_SYRINGE | INTRAVENOUS | Status: DC | PRN
Start: 2023-04-29 — End: 2023-04-29
  Administered 2023-04-29 (×2): 10 mg via INTRAVENOUS

## 2023-04-29 MED ORDER — EPHEDRINE 5 MG/ML INJ
INTRAVENOUS | Status: AC
  Filled 2023-04-29: qty 5

## 2023-04-29 MED ORDER — PROPOFOL 1000 MG/100ML IV EMUL
INTRAVENOUS | Status: AC
  Filled 2023-04-29: qty 100

## 2023-04-29 MED ORDER — PROPOFOL 10 MG/ML IV BOLUS
INTRAVENOUS | Status: DC | PRN
  Administered 2023-04-29: 100 mg via INTRAVENOUS

## 2023-04-29 MED ORDER — PROPOFOL 500 MG/50ML IV EMUL
INTRAVENOUS | Status: DC | PRN
Start: 2023-04-29 — End: 2023-04-29
  Administered 2023-04-29: 150 ug/kg/min via INTRAVENOUS

## 2023-04-29 NOTE — H&P (Signed)
 Outpatient short stay form Pre-procedure 04/29/2023 1:08 PM [name], M.D.  Primary Physician: [name], M.D.  Reason for visit:  Personal history of adenomatous colon polyps  History of present illness:                            Patient presents for colonoscopy for a personal hx of colon polyps. The patient denies abdominal pain, abnormal weight loss or rectal bleeding.      Current Facility-Administered Medications:    0.9 %  sodium chloride infusion, , Intravenous, Continuous, [place], Boykin Nearing, MD, Last Rate: 20 mL/hr at 04/29/23 1302, Continued from Pre-op at 04/29/23 1302  Medications Prior to Admission  Medication Sig Dispense Refill Last Dose/Taking   fenofibrate (TRICOR) 145 MG tablet Take 1 tablet (145 mg total) by mouth daily. 90 tablet 3 Past Week   isosorbide mononitrate (IMDUR) 30 MG 24 hr tablet Take 1 tablet (30 mg total) by mouth daily. 90 tablet 0 Past Week   lisinopril (ZESTRIL) 2.5 MG tablet TAKE 1 TABLET BY MOUTH EVERY DAY 90 tablet 1 Past Week   Multiple Vitamins-Minerals (ZINC PO) Take 1 capsule by mouth daily.   Past Week   Omega-3 Fatty Acids (FISH OIL) 1000 MG CAPS Take 1,000 mg by mouth daily.   Past Week   vitamin B-12 (CYANOCOBALAMIN) 1000 MCG tablet Take 1,000 mcg by mouth daily.   Past Week   Accu-Chek FastClix Lancets MISC Use as instructed to check blood sugar up to 2 times daily 102 each 5    ACCU-CHEK GUIDE test strip USE AS INSTRUCTED TO CHECK BLOOD SUGAR UP TO 2 TIMES DAILY 100 strip 5    Accu-Chek Softclix Lancets lancets Use to check blood sugars 2 times daily 100 each 12    aspirin EC 81 MG tablet Take 81 mg by mouth daily.   04/22/2023   atorvastatin (LIPITOR) 80 MG tablet Take 1 tablet (80 mg total) by mouth daily. (Patient not taking: Reported on 04/29/2023) 90 tablet 3 Not Taking   Blood Glucose Monitoring Suppl (ACCU-CHEK GUIDE ME) w/Device KIT Use as instructed to check blood sugar up to 2 times daily 1 kit 0    clopidogrel  (PLAVIX) 75 MG tablet TAKE 1 TABLET BY MOUTH EVERY DAY 90 tablet 0 04/22/2023   Continuous Glucose Sensor (DEXCOM G7 SENSOR) MISC CHANGE SENSORS EVERY 10 DAYS 3 each 3    cyclobenzaprine (FLEXERIL) 5 MG tablet Take 1 tablet (5 mg total) by mouth 3 (three) times daily as needed for muscle spasms. 30 tablet 0    dapagliflozin propanediol (FARXIGA) 10 MG TABS tablet Take 1 tablet (10 mg total) by mouth daily before breakfast. 90 tablet 3 04/25/2023   EPINEPHrine 0.3 mg/0.3 mL IJ SOAJ injection Inject 0.3 mg into the muscle as needed for anaphylaxis.      erythromycin ophthalmic ointment       famotidine (PEPCID) 20 MG tablet Take 20 mg by mouth daily.      glucose blood (ACCU-CHEK GUIDE) test strip 1 each by Other route in the morning and at bedtime. Use as instructed 200 each 3    insulin aspart (NOVOLOG FLEXPEN) 100 UNIT/ML FlexPen Max daily 100 units 90 mL 6    insulin degludec (TRESIBA FLEXTOUCH) 100 UNIT/ML FlexTouch Pen Inject 44 Units into the skin daily. 45 mL 3    Insulin Pen Needle (B-D UF III MINI PEN NEEDLES) 31G X 5 MM MISC Inject 1  Device into the skin in the morning, at noon, in the evening, and at bedtime. 400 each 3    levocetirizine (XYZAL) 5 MG tablet Take 1 tablet by mouth every evening.      metoprolol succinate (TOPROL-XL) 25 MG 24 hr tablet TAKE 1/2 TABLET BY MOUTH DAILY 45 tablet 0 04/27/2023   nitroGLYCERIN (NITROSTAT) 0.4 MG SL tablet Place 1 tablet (0.4 mg total) under the tongue every 5 (five) minutes as needed for chest pain. 25 tablet 3    oxyCODONE (OXY IR/ROXICODONE) 5 MG immediate release tablet Take 5 mg by mouth every 8 (eight) hours as needed.      pioglitazone (ACTOS) 45 MG tablet Take 1 tablet (45 mg total) by mouth daily. 90 tablet 3 04/27/2023   ranolazine (RANEXA) 500 MG 12 hr tablet TAKE 1 TABLET BY MOUTH TWICE A DAY 180 tablet 0 04/27/2023   tirzepatide (MOUNJARO) 5 MG/0.5ML Pen Inject 5 mg into the skin once a week. 6 mL 3 03/21/2023     Allergies  Allergen  Reactions   Shellfish Allergy Anaphylaxis   Penicillins Swelling    Childhood   Lisinopril Other (See Comments)    Hyperkalemia, increased creatinine   Erythromycin Rash and Other (See Comments)    Unknown     Past Medical History:  Diagnosis Date   Anemia in chronic kidney disease    Bilateral hip pain    a. 05/2021 ABIs: nl bilaterally.   Chronic metabolic acidosis    CKD (chronic kidney disease), stage III (HCC)    Coronary artery disease    a.  H/o NSTEMI s/p LAD/RCA stenting; b. 08/2019 PCI: RCA 99p (2.5x15 Resolute Onyx DES), 75p/m, mild ISR; c. 09/2019 PCI: 85p - heavily calcified (intravascular lithotripsy & 3.0x26 Resolute Onyx DES); d. 10/18/19 Relook cath: stable anatomy->Med Rx;  e. 05/2021 Cath: LM 25, LAD 73m ISR, 75/70/50d, RI small - 90, LCX 50p/m/d, OM3 40, RCA 30p ISR, 20p/m ISR, 60d, RPDA 70-->Med Rx.   Diabetes mellitus without complication (HCC)    Diastolic dysfunction    a. 08/2019 Echo: EF 50-55%, no rwma, Gr1 DD, nl RV size/fxn. Triv MR.   GERD (gastroesophageal reflux disease)    Hyperlipidemia    Hypertension    NSTEMI (non-ST elevated myocardial infarction) (HCC) 08/2019   Sleep apnea    Tobacco abuse     Review of systems:  Otherwise negative.    Physical Exam  Gen: Alert, oriented. Appears stated age.  HEENT: [name]/AT. PERRLA. Lungs: CTA, no wheezes. CV: RR nl S1, S2. Abd: soft, benign, no masses. BS+ Ext: No edema. Pulses 2+    Planned procedures: Proceed with colonoscopy. The patient understands the nature of the planned procedure, indications, risks, alternatives and potential complications including but not limited to bleeding, infection, perforation, damage to internal organs and possible oversedation/side effects from anesthesia. The patient agrees and gives consent to proceed.  Please refer to procedure notes for findings, recommendations and patient disposition/instructions.     [name] K. [name], M.D. Gastroenterology 04/29/2023  1:08  PM

## 2023-04-29 NOTE — Interval H&P Note (Signed)
 History and Physical Interval Note:  04/29/2023 1:08 PM  [name]  has presented today for surgery, with the diagnosis of Z86.0101 (ICD-10-CM) - Hx of adenomatous colonic polyps.  The various methods of treatment have been discussed with the patient and family. After consideration of risks, benefits and other options for treatment, the patient has consented to  Procedure(s): COLONOSCOPY WITH PROPOFOL (N/A) as a surgical intervention.  The patient's history has been reviewed, patient examined, no change in status, stable for surgery.  I have reviewed the patient's chart and labs.  Questions were answered to the patient's satisfaction.     [place], [facility]

## 2023-04-29 NOTE — Transfer of Care (Signed)
 Immediate Anesthesia Transfer of Care Note  Patient: [name]  Procedure(s) Performed: COLONOSCOPY WITH PROPOFOL POLYPECTOMY  Patient Location: PACU  Anesthesia Type:General  Level of Consciousness: awake and sedated  Airway & Oxygen Therapy: Patient Spontanous Breathing and Patient connected to nasal cannula oxygen  Post-op Assessment: Report given to RN and Post -op Vital signs reviewed and stable  Post vital signs: Reviewed and stable  Last Vitals:  Vitals Value Taken Time  BP    Temp    Pulse    Resp    SpO2      Last Pain:  Vitals:   04/29/23 1211  TempSrc: Temporal  PainSc: 0-No pain         Complications: There were no known notable events for this encounter.

## 2023-04-29 NOTE — Anesthesia Preprocedure Evaluation (Signed)
 Anesthesia Evaluation  Patient identified by MRN, date of birth, ID band Patient awake    Reviewed: Allergy & Precautions, H&P , NPO status , Patient's Chart, lab work & pertinent test results, reviewed documented beta blocker date and time   Airway Mallampati: III   Neck ROM: full    Dental  (+) Poor Dentition   Pulmonary sleep apnea and Continuous Positive Airway Pressure Ventilation , Current SmokerPatient did not abstain from smoking.   Pulmonary exam normal        Cardiovascular Exercise Tolerance: Good hypertension, On Medications + angina with exertion + CAD and + Past MI  Normal cardiovascular exam Rhythm:regular Rate:Normal     Neuro/Psych negative neurological ROS  negative psych ROS   GI/Hepatic Neg liver ROS,GERD  Medicated,,  Endo/Other  negative endocrine ROSdiabetes, Well Controlled, Type 1    Renal/GU Renal disease  negative genitourinary   Musculoskeletal   Abdominal   Peds  Hematology  (+) Blood dyscrasia, anemia   Anesthesia Other Findings Past Medical History: No date: Anemia in chronic kidney disease No date: Bilateral hip pain     Comment:  a. 05/2021 ABIs: nl bilaterally. No date: Chronic metabolic acidosis No date: CKD (chronic kidney disease), stage III (HCC) No date: Coronary artery disease     Comment:  a.  H/o NSTEMI s/p LAD/RCA stenting; b. 08/2019 PCI: RCA               99p (2.5x15 Resolute Onyx DES), 75p/m, mild ISR; c.               09/2019 PCI: 85p - heavily calcified (intravascular               lithotripsy & 3.0x26 Resolute Onyx DES); d. 10/18/19               Relook cath: stable anatomy->Med Rx;  e. 05/2021 Cath: LM               25, LAD 55m ISR, 75/70/50d, RI small - 90, LCX 50p/m/d,               OM3 40, RCA 30p ISR, 20p/m ISR, 60d, RPDA 70-->Med Rx. No date: Diabetes mellitus without complication (HCC) No date: Diastolic dysfunction     Comment:  a. 08/2019 Echo: EF 50-55%, no  rwma, Gr1 DD, nl RV               size/fxn. Triv MR. No date: GERD (gastroesophageal reflux disease) No date: Hyperlipidemia No date: Hypertension 08/2019: NSTEMI (non-ST elevated myocardial infarction) (HCC) No date: Sleep apnea No date: Tobacco abuse Past Surgical History: No date: APPENDECTOMY No date: CARDIAC CATHETERIZATION No date: COLONOSCOPY 09/16/2017: COLONOSCOPY WITH PROPOFOL; N/A     Comment:  Procedure: COLONOSCOPY WITH PROPOFOL;  Surgeon: [place],               Boykin Nearing, MD;  Location: [facility] ENDOSCOPY;  Service:               Gastroenterology;  Laterality: N/A;  (+) DM - oral 08/31/2019: CORONARY STENT INTERVENTION; N/A     Comment:  Procedure: CORONARY STENT INTERVENTION;  Surgeon:               Alwyn Pea, MD;  Location: [facility] INVASIVE CV LAB;               Service: Cardiovascular;  Laterality: N/A; 09/29/2019: CORONARY STENT INTERVENTION 09/29/2019: CORONARY STENT INTERVENTION; N/A     Comment:  Procedure: CORONARY STENT INTERVENTION;  Surgeon: Iran Ouch, MD;  Location: [facility] INVASIVE CV LAB;  Service:               Cardiovascular;  Laterality: N/A; No date: EXPLORATORY LAPAROTOMY No date: EYE SURGERY No date: HERNIA REPAIR 08/31/2019: LEFT HEART CATH AND CORONARY ANGIOGRAPHY; N/A     Comment:  Procedure: LEFT HEART CATH AND CORONARY ANGIOGRAPHY               possible PCI and stent;  Surgeon: Alwyn Pea, MD;              Location: [facility] INVASIVE CV LAB;  Service: Cardiovascular;              Laterality: N/A; 10/18/2019: LEFT HEART CATH AND CORONARY ANGIOGRAPHY; Left     Comment:  Procedure: LEFT HEART CATH AND CORONARY ANGIOGRAPHY;                Surgeon: Iran Ouch, MD;  Location: [facility] INVASIVE               CV LAB;  Service: Cardiovascular;  Laterality: Left; 05/25/2021: LEFT HEART CATH AND CORONARY ANGIOGRAPHY; Left     Comment:  Procedure: LEFT HEART CATH AND CORONARY ANGIOGRAPHY;                Surgeon: Iran Ouch, MD;   Location: [facility] INVASIVE               CV LAB;  Service: Cardiovascular;  Laterality: Left; BMI    Body Mass Index: 31.80 kg/m     Reproductive/Obstetrics negative OB ROS                             Anesthesia Physical Anesthesia Plan  ASA: 3  Anesthesia Plan: General   Post-op Pain Management:    Induction:   PONV Risk Score and Plan:   Airway Management Planned:   Additional Equipment:   Intra-op Plan:   Post-operative Plan:   Informed Consent: I have reviewed the patients History and Physical, chart, labs and discussed the procedure including the risks, benefits and alternatives for the proposed anesthesia with the patient or authorized representative who has indicated his/her understanding and acceptance.     Dental Advisory Given  Plan Discussed with: CRNA  Anesthesia Plan Comments:        Anesthesia Quick Evaluation

## 2023-04-29 NOTE — Op Note (Signed)
 [facility] Gastroenterology Patient Name: [name] Procedure Date: 04/29/2023 12:27 PM MRN: [id number] Account #: [id number] Date of Birth: Jun 22, 1960 Admit Type: Outpatient Age: 63 Room: [facility] ENDO ROOM 1 Gender: Male Note Status: Finalized Instrument Name: Prentice Docker [id number] Procedure:             Colonoscopy Indications:           Surveillance: Personal history of adenomatous polyps                         on last colonoscopy > 5 years ago Providers:             Royce Macadamia K. Norma Fredrickson MD, MD Referring MD:          Teena Irani. Terance Hart, MD (Referring MD) Medicines:             Propofol per Anesthesia Complications:         No immediate complications. Estimated blood loss: None. Procedure:             Pre-Anesthesia Assessment:                        - The risks and benefits of the procedure and the                         sedation options and risks were discussed with the                         patient. All questions were answered and informed                         consent was obtained.                        - Patient identification and proposed procedure were                         verified prior to the procedure by the nurse. The                         procedure was verified in the procedure room.                        - ASA Grade Assessment: III - A patient with severe                         systemic disease.                        - After reviewing the risks and benefits, the patient                         was deemed in satisfactory condition to undergo the                         procedure.                        After obtaining informed consent, the colonoscope was  passed under direct vision. Throughout the procedure,                         the patient's blood pressure, pulse, and oxygen                         saturations were monitored continuously. The                         Colonoscope was introduced through the anus  and                         advanced to the the cecum, identified by appendiceal                         orifice and ileocecal valve. The colonoscopy was                         performed without difficulty. The patient tolerated                         the procedure well. The quality of the bowel                         preparation was adequate. The ileocecal valve,                         appendiceal orifice, and rectum were photographed. Findings:      The perianal and digital rectal examinations were normal. Pertinent       negatives include normal sphincter tone and no palpable rectal lesions.      Non-bleeding internal hemorrhoids were found during retroflexion. The       hemorrhoids were Grade I (internal hemorrhoids that do not prolapse).      A 12 mm polyp was found in the descending colon. The polyp was sessile.       The polyp was removed with a hot snare. The polyp was removed with a       cold snare. Resection and retrieval were complete.      The exam was otherwise without abnormality. Impression:            - Non-bleeding internal hemorrhoids.                        - One 12 mm polyp in the descending colon, removed                         with a hot snare and removed with a cold snare.                         Resected and retrieved.                        - The examination was otherwise normal. Recommendation:        - Patient has a contact number available for                         emergencies. The signs and symptoms of potential  delayed complications were discussed with the patient.                         Return to normal activities tomorrow. Written                         discharge instructions were provided to the patient.                        - Resume previous diet.                        - Continue present medications.                        - Resume Plavix (clopidogrel) at prior dose tomorrow.                         Refer to managing  physician for further adjustment of                         therapy.                        - Await pathology results.                        - Repeat colonoscopy is recommended for surveillance.                         The colonoscopy date will be determined after                         pathology results from today's exam become available                         for review.                        - Return to GI office PRN.                        - The findings and recommendations were discussed with                         the patient. Procedure Code(s):     --- Professional ---                        [phone], Colonoscopy, flexible; with removal of                         tumor(s), polyp(s), or other lesion(s) by snare                         technique Diagnosis Code(s):     --- Professional ---                        K64.0, First degree hemorrhoids                        D12.4, Benign neoplasm of descending  colon                        Z86.010, Personal history of colonic polyps CPT copyright 2022 [employer]. All rights reserved. The codes documented in this report are preliminary and upon coder review may  be revised to meet current compliance requirements. Stanton Kidney MD, MD 04/29/2023 1:31:44 PM This report has been signed electronically. Number of Addenda: 0 Note Initiated On: 04/29/2023 12:27 PM Scope Withdrawal Time: 0 hours 10 minutes 43 seconds  Total Procedure Duration: 0 hours 13 minutes 6 seconds  Estimated Blood Loss:  Estimated blood loss: none.      [facility]

## 2023-04-30 ENCOUNTER — Encounter: Payer: Self-pay | Admitting: Internal Medicine

## 2023-04-30 LAB — SURGICAL PATHOLOGY

## 2023-04-30 NOTE — Anesthesia Postprocedure Evaluation (Signed)
 Anesthesia Post Note  Patient: SENG LARCH  Procedure(s) Performed: COLONOSCOPY WITH PROPOFOL POLYPECTOMY  Patient location during evaluation: PACU Anesthesia Type: General Level of consciousness: awake and alert Pain management: pain level controlled Vital Signs Assessment: post-procedure vital signs reviewed and stable Respiratory status: spontaneous breathing, nonlabored ventilation, respiratory function stable and patient connected to nasal cannula oxygen Cardiovascular status: blood pressure returned to baseline and stable Postop Assessment: no apparent nausea or vomiting Anesthetic complications: no   There were no known notable events for this encounter.   Last Vitals:  Vitals:   04/29/23 1343 04/29/23 1353  BP: 110/77 98/87  Pulse: 87   Resp: 19   Temp: (!) 35.3 C   SpO2: 99% 100%    Last Pain:  Vitals:   04/30/23 0727  TempSrc:   PainSc: 0-No pain                 Yevette Edwards

## 2023-05-29 ENCOUNTER — Telehealth: Payer: Self-pay

## 2023-05-29 DIAGNOSIS — F17210 Nicotine dependence, cigarettes, uncomplicated: Secondary | ICD-10-CM

## 2023-05-29 DIAGNOSIS — Z122 Encounter for screening for malignant neoplasm of respiratory organs: Secondary | ICD-10-CM

## 2023-05-29 DIAGNOSIS — Z87891 Personal history of nicotine dependence: Secondary | ICD-10-CM

## 2023-05-29 NOTE — Telephone Encounter (Signed)
 Lung Cancer Screening Narrative/Criteria Questionnaire (Cigarette Smokers Only- No Cigars/Pipes/vapes)   [name]   SDMV:06/20/2023 at 8:00 am with Dorene Grebe         03-04-1960   LDCT: 06/23/2023 at 8:30 am at [facility]    63 y.o.   Phone: [phone]  Lung Screening Narrative (confirm age 94-77 yrs Medicare / 50-80 yrs Private pay insurance)   Insurance information: [employer]   Referring Provider:Bronstein, MD   This screening involves an initial phone call with a team member from our program. It is called a shared decision making visit. The initial meeting is required by  insurance and Medicare to make sure you understand the program. This appointment takes about 15-20 minutes to complete. You will complete the screening scan at your scheduled date/time.  This scan takes about 5-10 minutes to complete. You can eat and drink normally before and after the scan.  Criteria questions for Lung Cancer Screening:   Are you a current or former smoker? Current Age began smoking: 8   If you are a former smoker, what year did you quit smoking? (within 15 yrs)   To calculate your smoking history, I need an accurate estimate of how many packs of cigarettes you smoked per day and for how many years. (Not just the number of PPD you are now smoking)   Years smoking 54 x Packs per day 3 = Pack years 162   (at least 20 pack yrs)   (Make sure they understand that we need to know how much they have smoked in the past, not just the number of PPD they are smoking now)  Do you have a personal history of cancer?  No    Do you have a family history of cancer? Yes  (cancer type and and relative) Mother Lung Cousin Leg Cancer  Are you coughing up blood?  No  Have you had unexplained weight loss of 15 lbs or more in the last 6 months? No  It looks like you meet all criteria.  When would be a good time for [place] to schedule you for this screening?   Additional information: N/A

## 2023-05-30 ENCOUNTER — Ambulatory Visit: Admitting: Internal Medicine

## 2023-05-30 ENCOUNTER — Encounter: Payer: Self-pay | Admitting: Internal Medicine

## 2023-05-30 VITALS — BP 124/80 | HR 85 | Ht 66.0 in | Wt 202.0 lb

## 2023-05-30 DIAGNOSIS — E1165 Type 2 diabetes mellitus with hyperglycemia: Secondary | ICD-10-CM

## 2023-05-30 DIAGNOSIS — E1159 Type 2 diabetes mellitus with other circulatory complications: Secondary | ICD-10-CM

## 2023-05-30 DIAGNOSIS — Z794 Long term (current) use of insulin: Secondary | ICD-10-CM

## 2023-05-30 DIAGNOSIS — Z7985 Long-term (current) use of injectable non-insulin antidiabetic drugs: Secondary | ICD-10-CM

## 2023-05-30 DIAGNOSIS — E1122 Type 2 diabetes mellitus with diabetic chronic kidney disease: Secondary | ICD-10-CM

## 2023-05-30 DIAGNOSIS — N1831 Chronic kidney disease, stage 3a: Secondary | ICD-10-CM

## 2023-05-30 DIAGNOSIS — E782 Mixed hyperlipidemia: Secondary | ICD-10-CM | POA: Diagnosis not present

## 2023-05-30 DIAGNOSIS — Z7984 Long term (current) use of oral hypoglycemic drugs: Secondary | ICD-10-CM

## 2023-05-30 LAB — POCT GLYCOSYLATED HEMOGLOBIN (HGB A1C): Hemoglobin A1C: 7.1 % — AB (ref 4.0–5.6)

## 2023-05-30 MED ORDER — TIRZEPATIDE 7.5 MG/0.5ML ~~LOC~~ SOAJ: 7.5000 mg | SUBCUTANEOUS | 3 refills | Status: DC

## 2023-05-30 NOTE — Patient Instructions (Addendum)
-   Increase Mounjaro 7.5 mg once weekly  - Continue Farxiga 10 mg daily in the morning  - Continue Pioglitazone 45 mg daily  - Decrease Tresiba 22 units daily  - Decrease Novolog 6 units with each meal  -Novolog correctional insulin: ADD extra units on insulin to your meal-time Novolog dose if your blood sugars are higher than 160. Use the scale below to help guide you:   Blood sugar before meal Number of units to inject  Less than 160 0 unit  161 -  190 1 units  191 -  220 2 units  221 -  250 3 units  251 -  280 4 units  281 -  310 5 units  311 -  340 6 units  341 -  370 7 units  371 -  400 8 units  401 - 430 9 units  431 - 460 10 units  461 - 490 11 units   491 - 520 12 units        HOW TO TREAT LOW BLOOD SUGARS (Blood sugar LESS THAN 70 MG/DL) Please follow the RULE OF 15 for the treatment of hypoglycemia treatment (when your (blood sugars are less than 70 mg/dL)   STEP 1: Take 15 grams of carbohydrates when your blood sugar is low, which includes:  3-4 GLUCOSE TABS  OR 3-4 OZ OF JUICE OR REGULAR SODA OR ONE TUBE OF GLUCOSE GEL    STEP 2: RECHECK blood sugar in 15 MINUTES STEP 3: If your blood sugar is still low at the 15 minute recheck --> then, go back to STEP 1 and treat AGAIN with another 15 grams of carbohydrates.

## 2023-05-30 NOTE — Progress Notes (Signed)
 Name: [name]  Age/ Sex: 63 y.o., male   MRN/ DOB: [id number], Jul 23, 1960     PCP: Dorothey Baseman, MD   Reason for Endocrinology Evaluation: Type 2 Diabetes Mellitus  Initial Endocrine Consultative Visit: 11/03/2019    PATIENT IDENTIFIER: [name] is a 63 y.o. male with a past medical history of T2DM, CAD, OSA and HTN.  The patient has followed with Endocrinology clinic since 11/03/2019 for consultative assistance with management of his diabetes.  DIABETIC HISTORY:  [name] was diagnosed with DM many years ago. Trulicity has caused nausea. His hemoglobin A1c has ranged from 9.0% in 12/2019, peaking at 12.1% in 10/2019.  No personal hx of pancreatitis    On his initial visit to our clinic he had an A1c of 12.1 %. He was on metformin, Glipizide and pioglitazone. We increased Glipizide     By 12/2019 reduced metformin due to low GFG, stopped by October 2023 due to diarrhea and low GFR  Stopped Glipizide and started basal insulin 06/2020 as well as Marcelline Deist    Started Trulicity in May 2023 but was unable to tolerate due to abdominal pain and stopped by October 2023  He stopped metformin due to diarrhea 12/2022 Started Ozempic 12/2022 but did not tolerate   He was started on Glipizide XL 2.5 mg daily by his PCP which results in hypoglycemia as low as 59 mg/dL   Mounjaro started 05/979   Prandial insulin was started 11/2022 due to persistent hyperglycemia  SUBJECTIVE:   During the last visit (03/06/2023): A1c 11.7%    Today (05/30/2023): [name] is here for a follow up on diabetes.  He is accompanied by his wife [name]. He checks his blood sugars occasionally . The patient has had hypoglycemic episodes since the last clinic visit.   Patient continues to follow-up with nephrology (Dr. Thedore Mins with [facility]) He continues to follow-up with cardiology  Denies nausea or vomiting  Denies constipation or diarrhea   HOME ENDOCRINE  REGIMEN:  Mounjaro 5 mg  weekly Farxiga 10 mg daily in the morning  Pioglitazone 45 mg daily  Tresiba 24 units daily NovoLog 10 units 3 times daily before every meal CF: NovoLog (BG -130/30) TIDQAC and QHS   Atorvastatin 80 mg daily Fenofibrate 145 mg daily      Statin: yes ACE-I/ARB: no    CONTINUOUS GLUCOSE MONITORING RECORD INTERPRETATION    Dates of Recording:3/29-4/12/2023  Sensor description: Dexcom  Results statistics:   CGM use % of time 90  Average and SD 155/44  Time in range 71 %  % Time Above 180 26  % Time above 250 2  % Time Below target 1   Glycemic patterns summary: BGs are optimal overnight and fluctuate during the day   Hyperglycemic episodes postprandial   Hypoglycemic episodes occurred at variable times but mostly during the day  Overnight periods: Optimal    DIABETIC COMPLICATIONS: Microvascular complications:   Denies: Denies: CKD, retinopathy, neuropathy  Last Eye Exam: Completed 01/2021  Macrovascular complications:  CAD ( S/P PCI) Denies: CVA, PVD   HISTORY:  Past Medical History:  Past Medical History:  Diagnosis Date   Anemia in chronic kidney disease    Bilateral hip pain    a. 05/2021 ABIs: nl bilaterally.   Chronic metabolic acidosis    CKD (chronic kidney disease), stage III (HCC)    Coronary artery disease    a.  H/o NSTEMI s/p LAD/RCA stenting; b. 08/2019 PCI: RCA 99p (2.5x15 Resolute  Onyx DES), 75p/m, mild ISR; c. 09/2019 PCI: 85p - heavily calcified (intravascular lithotripsy & 3.0x26 Resolute Onyx DES); d. 10/18/19 Relook cath: stable anatomy->Med Rx;  e. 05/2021 Cath: LM 25, LAD 65m ISR, 75/70/50d, RI small - 90, LCX 50p/m/d, OM3 40, RCA 30p ISR, 20p/m ISR, 60d, RPDA 70-->Med Rx.   Diabetes mellitus without complication (HCC)    Diastolic dysfunction    a. 08/2019 Echo: EF 50-55%, no rwma, Gr1 DD, nl RV size/fxn. Triv MR.   GERD (gastroesophageal reflux disease)    Hyperlipidemia    Hypertension    NSTEMI (non-ST elevated myocardial  infarction) (HCC) 08/2019   Sleep apnea    Tobacco abuse    Past Surgical History:  Past Surgical History:  Procedure Laterality Date   APPENDECTOMY     CARDIAC CATHETERIZATION     COLONOSCOPY     COLONOSCOPY WITH PROPOFOL N/A 09/16/2017   Procedure: COLONOSCOPY WITH PROPOFOL;  Surgeon: Toledo, Boykin Nearing, MD;  Location: [facility]OSCOPY;  Service: Gastroenterology;  Laterality: N/A;  (+) DM - oral   COLONOSCOPY WITH PROPOFOL N/A 04/29/2023   Procedure: COLONOSCOPY WITH PROPOFOL;  Surgeon: Toledo, Boykin Nearing, MD;  Location: [facility] ENDOSCOPY;  Service: Gastroenterology;  Laterality: N/A;   CORONARY STENT INTERVENTION N/A 08/31/2019   Procedure: CORONARY STENT INTERVENTION;  Surgeon: Alwyn Pea, MD;  Location: [facility] IN[facility]ASIVE CV [facility];  Service: Cardiovascular;  Laterality: N/A;   CORONARY STENT INTERVENTION  09/29/2019   CORONARY STENT INTERVENTION N/A 09/29/2019   Procedure: CORONARY STENT INTERVENTION;  Surgeon: Iran Ouch, MD;  Location: [facility];  Service: Cardiovascular;  Laterality: N/A;   EXPLORATORY LAPAROTOMY     EYE SURGERY     HERNIA REPAIR     LEFT HEART CATH AND CORONARY ANGIOGRAPHY N/A 08/31/2019   Procedure: LEFT HEART CATH AND CORONARY ANGIOGRAPHY possible PCI and stent;  Surgeon: Alwyn Pea, MD;  Location: [facility];  Service: Cardiovascular;  Laterality: N/A;   LEFT HEART CATH AND CORONARY ANGIOGRAPHY Left 10/18/2019   Procedure: LEFT HEART CATH AND CORONARY ANGIOGRAPHY;  Surgeon: Iran Ouch, MD;  Location: [facility];  Service: Cardiovascular;  Laterality: Left;   LEFT HEART CATH AND CORONARY ANGIOGRAPHY Left 05/25/2021   Procedure: LEFT HEART CATH AND CORONARY ANGIOGRAPHY;  Surgeon: Iran Ouch, MD;  Location: [facility];  Service: Cardiovascular;  Laterality: Left;   POLYPECTOMY  04/29/2023   Procedure: POLYPECTOMY;  Surgeon: Toledo, Boykin Nearing, MD;  Location: [facility];  Service: Gastroenterology;;    Social History:  reports that he has been smoking cigarettes. He has a 12.5 pack-year smoking history. He has never used smokeless tobacco. He reports that he does not currently use alcohol. He reports that he does not use drugs. Family History:  Family History  Problem Relation Age of Onset   Heart disease Father    Heart attack Father    Hypertension Father    Diabetes Father      HOME MEDICATIONS: Allergies as of 05/30/2023       Reactions   Shellfish Allergy Anaphylaxis   Penicillins Swelling   Childhood   Lisinopril Other (See Comments)   Hyperkalemia, increased creatinine   Erythromycin Rash, Other (See Comments)   Unknown        Medication List        Accurate as of May 30, 2023  8:07 AM. If you have any questions, ask your nurse or doctor.  Accu-Chek FastClix Lancets Misc Use as instructed to check blood sugar up to 2 times daily   Accu-Chek Softclix Lancets lancets Use to check blood sugars 2 times daily   Accu-Chek Guide Me w/Device Kit Use as instructed to check blood sugar up to 2 times daily   Accu-Chek Guide test strip Generic drug: glucose blood USE AS INSTRUCTED TO CHECK BLOOD SUGAR UP TO 2 TIMES DAILY   Accu-Chek Guide test strip Generic drug: glucose blood 1 each by Other route in the morning and at bedtime. Use as instructed   aspirin EC 81 MG tablet Take 81 mg by mouth daily.   atorvastatin 80 MG tablet Commonly known as: LIPITOR Take 1 tablet (80 mg total) by mouth daily.   B-D UF III MINI PEN NEEDLES 31G X 5 MM Misc Generic drug: Insulin Pen Needle Inject 1 Device into the skin in the morning, at noon, in the evening, and at bedtime.   clopidogrel 75 MG tablet Commonly known as: PLAVIX TAKE 1 TABLET BY MOUTH EVERY DAY   cyanocobalamin 1000 MCG tablet Commonly known as: VITAMIN B12 Take 1,000 mcg by mouth daily.   cyclobenzaprine 5 MG tablet Commonly known as: FLEXERIL Take 1 tablet (5 mg total) by mouth 3  (three) times daily as needed for muscle spasms.   dapagliflozin propanediol 10 MG Tabs tablet Commonly known as: Farxiga Take 1 tablet (10 mg total) by mouth daily before breakfast.   Dexcom G7 Sensor Misc CHANGE SENSORS EVERY 10 DAYS   EPINEPHrine 0.3 mg/0.3 mL Soaj injection Commonly known as: EPI-PEN Inject 0.3 mg into the muscle as needed for anaphylaxis.   erythromycin ophthalmic ointment   famotidine 20 MG tablet Commonly known as: PEPCID Take 20 mg by mouth daily.   fenofibrate 145 MG tablet Commonly known as: TRICOR Take 1 tablet (145 mg total) by mouth daily.   Fish Oil 1000 MG Caps Take 1,000 mg by mouth daily.   fluticasone 50 MCG/ACT nasal spray Commonly known as: FLONASE Place into both nostrils.   isosorbide mononitrate 30 MG 24 hr tablet Commonly known as: IMDUR Take 1 tablet (30 mg total) by mouth daily.   levocetirizine 5 MG tablet Commonly known as: XYZAL Take 1 tablet by mouth every evening.   lisinopril 2.5 MG tablet Commonly known as: ZESTRIL TAKE 1 TABLET BY MOUTH EVERY DAY   metoprolol succinate 25 MG 24 hr tablet Commonly known as: TOPROL-XL TAKE 1/2 TABLET BY MOUTH DAILY   nitroGLYCERIN 0.4 MG SL tablet Commonly known as: NITROSTAT Place 1 tablet (0.4 mg total) under the tongue every 5 (five) minutes as needed for chest pain.   NovoLOG FlexPen 100 UNIT/ML FlexPen Generic drug: insulin aspart Max daily 100 units   oxyCODONE 5 MG immediate release tablet Commonly known as: Oxy IR/ROXICODONE Take 5 mg by mouth every 8 (eight) hours as needed.   pioglitazone 45 MG tablet Commonly known as: ACTOS Take 1 tablet (45 mg total) by mouth daily.   ranolazine 500 MG 12 hr tablet Commonly known as: RANEXA TAKE 1 TABLET BY MOUTH TWICE A DAY   tirzepatide 5 MG/0.5ML Pen Commonly known as: MOUNJARO Inject 5 mg into the skin once a week.   traMADol 50 MG tablet Commonly known as: ULTRAM Oral for 5 Days   Tresiba FlexTouch 100 UNIT/ML  FlexTouch Pen Generic drug: insulin degludec Inject 44 Units into the skin daily. What changed: how much to take   ZINC PO Take 1 capsule by mouth daily.  OBJECTIVE:   Vital Signs: BP 124/80 (BP Location: Left Arm, Patient Position: Sitting, Cuff Size: Normal)   Pulse 85   Ht 5\' 6"  (1.676 m)   Wt 202 lb (91.6 kg)   SpO2 97%   BMI 32.60 kg/m   Wt Readings from Last 3 Encounters:  05/30/23 202 lb (91.6 kg)  04/29/23 197 lb (89.4 kg)  03/06/23 206 lb 9.6 oz (93.7 kg)     Exam: General: Pt appears well and is in NAD  Lungs: Coarse breathing sounds   Heart: RRR   Extremities: No pretibial edema.  Neuro: MS is good with appropriate affect, pt is alert and Ox3       DM foot exam: 12/11/2022   The skin of the feet is intact without sores or ulcerations. The pedal pulses are 1+ on right and 1+ on left. The sensation is intact to a screening 5.07, 10 gram monofilament bilaterally    DATA REVIEWED:  Lab Results  Component Value Date   HGBA1C 7.1 (A) 05/30/2023   HGBA1C 11.7 (A) 03/06/2023   HGBA1C 10.7 (A) 12/11/2022     01/27/2023 @[facility]  Glucose 221 BUN 25 Cr.  1.69 GFR 45 PTH 39 Microalbumin/creatinine 11  ASSESSMENT / PLAN / RECOMMENDATIONS:   1) Type 2 Diabetes Mellitus, optimally controlled, with CKD III and macrovascular  complications - Most recent A1c of 11.7 %. Goal A1c < 7.0.   -A1c has improved dramatically, A1c trended down from 11.7% to 7.1% -He is intolerant to metformin due to diarrhea -He is intolerant to Trulicity and Ozempic due to abdominal pain -He is tolerating Mounjaro without side effects, will increase as below -I will also decrease his basal/prandial insulin as below to prevent hypoglycemia  MEDICATIONS: -Increase Mounjaro7.5 mg weekly -Continue Farxiga 10 mg daily in the morning  -Continue Pioglitazone 45 mg daily  -Decrease Tresiba 22 units daily -Decrease NovoLog 6 units 3 times daily before every  meal -Continue CF: NovoLog (BG -130/30) TIDQAC and QHS   EDUCATION / INSTRUCTIONS: BG monitoring instructions: Patient is instructed to check his blood sugars 4 times a day, before breakfast and supper. Call [facility] if: BG persistently < 70  I reviewed the Rule of 15 for the treatment of hypoglycemia in detail with the patient. Literature supplied.     2) Diabetic complications:  Eye: Does not have known diabetic retinopathy.  Neuro/ Feet: Does not have known diabetic peripheral neuropathy .  Renal: Patient does not have known baseline CKD. He   is not a candidate for  ACEI/ARB at present due to hyperkalemia . MA/Cr ratio is normal    3) Mixed Dyslipidemia:    -TG improved from 935 in 11/2020 , to 177 mg/DL 96/0454 -LDL at goal -Will check lipid panel on next visit  Medication Continue atorvastatin 80 mg daily Continue fenofibrate 145 mg daily   F/U in 4 months   Signed electronically by: Lyndle Herrlich, MD  [facility] End[facility]  [facility] [address] [place], [place] [address] Phone: [phone] FAX: [phone]   CC: Dorothey Baseman, MD [address] [place] [address] Phone: [phone]  Fax: [phone]  Return to Endocrinology clinic as below: Future Appointments  Date Time Provider Department Center  06/20/2023  8:00 AM [facility]UNG CANCER SCREENING NURSE VISIT [facility] None  06/20/2023  8:30 AM [facility]

## 2023-06-05 ENCOUNTER — Other Ambulatory Visit: Payer: Self-pay | Admitting: Cardiovascular Disease

## 2023-06-06 ENCOUNTER — Other Ambulatory Visit: Payer: Self-pay | Admitting: Cardiovascular Disease

## 2023-06-06 ENCOUNTER — Other Ambulatory Visit: Payer: Self-pay | Admitting: Nurse Practitioner

## 2023-06-07 ENCOUNTER — Other Ambulatory Visit: Payer: Self-pay | Admitting: Cardiovascular Disease

## 2023-06-16 ENCOUNTER — Ambulatory Visit: Payer: Medicare PPO | Admitting: Internal Medicine

## 2023-06-20 ENCOUNTER — Ambulatory Visit (INDEPENDENT_AMBULATORY_CARE_PROVIDER_SITE_OTHER): Admitting: Acute Care

## 2023-06-20 ENCOUNTER — Ambulatory Visit
Admission: RE | Admit: 2023-06-20 | Discharge: 2023-06-20 | Disposition: A | Discharge status: Home / Self Care | Source: Ambulatory Visit | Attending: Acute Care | Admitting: Acute Care

## 2023-06-20 DIAGNOSIS — Z87891 Personal history of nicotine dependence: Secondary | ICD-10-CM | POA: Insufficient documentation

## 2023-06-20 DIAGNOSIS — F17210 Nicotine dependence, cigarettes, uncomplicated: Secondary | ICD-10-CM | POA: Insufficient documentation

## 2023-06-20 DIAGNOSIS — Z122 Encounter for screening for malignant neoplasm of respiratory organs: Secondary | ICD-10-CM | POA: Diagnosis present

## 2023-06-20 NOTE — Progress Notes (Signed)
  Virtual Visit via Telephone Note  I connected with [name] on 06/20/23 at  8:00 AM EDT by telephone and verified that I am speaking with the correct person using two identifiers.  Location: Patient: [name] Provider: Alyse Bach,, RN   I discussed the limitations, risks, security and privacy concerns of performing an evaluation and management service by telephone and the availability of in person appointments. I also discussed with the patient that there may be a patient responsible charge related to this service. The patient expressed understanding and agreed to proceed.    Shared Decision Making Visit Lung Cancer Screening Program [phone])   Eligibility: Age 63 y.o. Pack Years Smoking History Calculation 166 (# packs/per year x # years smoked) Recent History of coughing up blood  no Unexplained weight loss? no ( >Than 15 pounds within the last 6 months ) Prior History Lung / other cancer no (Diagnosis within the last 5 years already requiring surveillance chest CT Scans). Smoking Status Current Smoker Former Smokers: Years since quit: n/a  Quit Date: n/a  Visit Components: Discussion included one or more decision making aids. yes Discussion included risk/benefits of screening. yes Discussion included potential follow up diagnostic testing for abnormal scans. yes Discussion included meaning and risk of over diagnosis. yes Discussion included meaning and risk of False Positives. yes Discussion included meaning of total radiation exposure. yes  Counseling Included: Importance of adherence to annual lung cancer LDCT screening. yes Impact of comorbidities on ability to participate in the program. yes Ability and willingness to under diagnostic treatment. yes  Smoking Cessation Counseling: Current Smokers:  Discussed importance of smoking cessation. yes Information about tobacco cessation classes and interventions provided to patient. yes Patient provided  with "ticket" for LDCT Scan. no Symptomatic Patient. no  Counseling(Intermediate counseling: > three minutes) 99406 Diagnosis Code: Tobacco Use Z72.0 Asymptomatic Patient yes  Counseling (Intermediate counseling: > three minutes counseling) U0454 Former Smokers:  Discussed the importance of maintaining cigarette abstinence. yes Diagnosis Code: Personal History of Nicotine Dependence. U98.119 Information about tobacco cessation classes and interventions provided to patient. Yes Patient provided with "ticket" for LDCT Scan. no Written Order for Lung Cancer Screening with LDCT placed in Epic. Yes (CT Chest Lung Cancer Screening Low Dose W/O CM) [id number] Z12.2-Screening of respiratory organs Z87.891-Personal history of nicotine dependence   Alyse Bach, RN   I agree with the documentation of the Shared Decision Making visit,  smoking cessation counseling if appropriate, and verification or eligibility for lung cancer screening as documented by the RN Nurse Navigator.   Raejean Bullock, MSN, AGACNP-BC [facility] See Amion for personal pager PCCM on call pager [phone]

## 2023-06-20 NOTE — Patient Instructions (Signed)

## 2023-07-16 ENCOUNTER — Other Ambulatory Visit: Payer: Self-pay | Admitting: Cardiovascular Disease

## 2023-07-16 ENCOUNTER — Other Ambulatory Visit: Payer: Self-pay | Admitting: Acute Care

## 2023-07-16 DIAGNOSIS — Z87891 Personal history of nicotine dependence: Secondary | ICD-10-CM

## 2023-07-16 DIAGNOSIS — Z122 Encounter for screening for malignant neoplasm of respiratory organs: Secondary | ICD-10-CM

## 2023-07-16 DIAGNOSIS — F17210 Nicotine dependence, cigarettes, uncomplicated: Secondary | ICD-10-CM

## 2023-07-16 NOTE — Progress Notes (Signed)
 Cardiology Clinic Note   Date: 07/22/2023 ID: JASHUA KNAAK, DOB [birth year], MRN [id number]  Primary Cardiologist:  Antionette Kirks, MD  Chief Complaint   [name] is a 63 y.o. male who presents to the clinic today for routine follow-up.  Patient Profile   [name] is followed by Dr. Alvenia Aus for the history outlined below.      Past medical history significant for: CAD. Remote history of stenting to LAD and mid RCA in the setting of NSTEMI. LHC 08/31/2019 (unstable angina): Proximal RCA #1 99%, #2 75%.  Proximal to mid RCA 50%.  Distal RCA 70%.  Proximal LCx 50%.  Distal LCx 50%.  Mid LCx 50%.  Mid LM 25%.  Distal LAD #1 75%, #2 75%, #3 70%, #4 50%.  Proximal to mid LAD 50%.  D1 50%.  PCI with DES 2.5 x 15 mm to proximal RCA.  More distal lesions not attempted secondary to severe calcifications. Echo 08/31/2019: EF 50 to 55%.  No RWMA.  Grade I DD.  Normal RV size/function.  Trivial MR.  Technically difficult study. LHC 09/29/2019: Proximal RCA #1 20% in-stent restenosis, #2 85%.  Proximal to mid RCA 50%.  Distal RCA 50%.  RPDA 60%.  Successful angioplasty, intravascular lithotripsy and DES 3.0 x 26 mm to proximal RCA. LHC 10/18/2019 (stable angina): Mid LM 25%.  Proximal LCx 50%.  Distal LCx 50%.  Mid LCx 50%.  Distal RCA 50%.  Proximal RCA 30%.  Proximal to mid RCA 20%.  RPDA 60%.  Distal LAD #1 75%, #2 70%, #3 50%.  Mid LAD 30%.  Ramus 80%.  Patent stents in the LAD and RCA with mild in-stent restenosis.  No targets for revascularization. LHC 05/25/2021 (angina): Mid LM 25%.  Distal LAD #1 75%, #2 70%, #3 50%.  Mid LAD 30%.  Proximal LCx 50%.  Distal LCx 50%.  Mid LCx 50%.  Proximal RCA 30%.  Proximal mid RCA 20%.  Distal RCA 60%.  RPDA 70%.  Ramus 90%.  OM 340%.  Heavily calcified coronary arteries with patent LAD and RCA stents with mild in-stent restenosis.  Stable diffuse moderate LCx disease and significant distal LAD disease.  Mild progression of disease in the distal RCA, right PDA  and ramus branch.  Otherwise no significant change from prior catheterization.  No targets for revascularization.  Recommend continued aggressive medical therapy. Hypertension. Hyperlipidemia. Lipid panel 03/27/2022: LDL 63, HDL 35, TG 151, total 128. OSA. Not on CPAP. GERD. T2DM. CKD stage IIIb. Tobacco abuse.  In summary, patient has a history of extensive CAD s/p LAD and RCA stenting as detailed above.  In July 2021 he underwent LHC for unstable angina with PCI to proximal RCA.  More distal lesions could not be accessed secondary to being unable to advance the balloon.  He had ongoing angina and was referred to Dr. Franky Ivanoff and underwent repeat LHC in August 2021 with successful PCI to proximal RCA.  He underwent repeat catheterization a few weeks later secondary to persistent angina with no significant change from prior cath and antianginal therapy was adjusted.  In the spring 2023 he complained of progressive exertional chest discomfort and underwent catheterization showing patent LAD and RCA stents with mild in-stent restenosis as detailed above.  Upon follow-up in January 2024 patient reported angina with higher level of activity otherwise he felt symptoms well-controlled with current antianginal therapy.  He reported dizziness with extending his neck for prolonged periods of time and underwent carotid ultrasound which showed  near normal carotids with normal flow in the vertebrals and subclavians.  Patient was last seen in the office by Katlyn West, NP on 11/15/2022 for routine follow-up.  He reported being seen at  [facility] ED in May secondary to dizziness and vertigo that had overall resolved.  He reported occasional dizziness with quick position changes.  He noted stable angina occurring with overexertion that quickly resolved with rest.  No other changes were made.     History of Present Illness    Today, patient is accompanied by his girlfriend.  He reports stable angina with overexertion that  resolves with rest.  He denies lower extremity edema, orthopnea, PND.  He states over the last week he has noted increased dizziness.  This occurs with and without position changes.  He is active doing yard work.  He reports BP has been low.  He does not check it at home.  He does not use CPAP as he could not tolerate it.    ROS: All other systems reviewed and are otherwise negative except as noted in History of Present Illness.  EKGs/Labs Reviewed    EKG Interpretation Date/Time:  Tuesday July 22 2023 15:17:32 EDT Ventricular Rate:  78 PR Interval:  206 QRS Duration:  98 QT Interval:  394 QTC Calculation: 449 R Axis:   -17  Text Interpretation: Normal sinus rhythm Left axis deviation When compared with ECG of 15-Nov-2022 08:58, No significant change Confirmed by Morey Ar [phone]) on 07/22/2023 3:25:05 PM    Physical Exam    VS:  BP 105/70 (BP Location: Left Arm, Patient Position: Sitting, Cuff Size: Normal)   Pulse 81   Resp 18   Ht 5\' 6"  (1.676 m)   Wt 199 lb (90.3 kg)   SpO2 98%   BMI 32.12 kg/m  , BMI Body mass index is 32.12 kg/m.  Orthostatic VS for the past 24 hrs (Last 3 readings):  BP- Lying Pulse- Lying BP- Sitting Pulse- Sitting BP- Standing at 0 minutes Pulse- Standing at 0 minutes BP- Standing at 3 minutes Pulse- Standing at 3 minutes  07/22/23 1608 110/74 76 96/71 79 101/65 79 99/69 81     GEN: Well nourished, well developed, in no acute distress. Neck: No JVD or carotid bruits. Cardiac:  RRR.  No murmurs. No rubs or gallops.   Respiratory:  Respirations regular and unlabored. Clear to auscultation without rales, wheezing or rhonchi. GI: Soft, nontender, nondistended. Extremities: Radials/DP/PT 2+ and equal bilaterally. No clubbing or cyanosis. No edema.  Skin: Warm and dry, no rash. Neuro: Strength intact.  Assessment & Plan   CAD with stable angina S/p remote LAD and RCA stenting in the setting of NSTEMI, PCI with DES to proximal RCA July 2021,  DES to proximal RCA August 2021.  Last [facility] April 2023 showed patent stents to RCA and LAD with mild in-stent restenosis otherwise stable diffuse moderate LCx disease and significant distal LAD disease with mild progression of disease in the distal RCA and right PDA and ramus branch otherwise no significant change from prior catheterization.  Patient reports stable angina occurring with overexertion and resolving quickly with rest.  He stays active doing yard work. - Continue aspirin , Plavix , atorvastatin , Tricor , Toprol , isosorbide  (see below), Ranexa , as needed SL NTG.  Hypertension/dizziness BP today 105/70.  He reports dizziness occurring with and without position changes worsening over the last week.  Orthostatics performed without a significant drop in BP with position changes.  He was not symptomatic during orthostatic vitals.  He reports he hydrates well drinking water and green tea. - Continue Toprol , lisinopril . - Decrease isosorbide  to 15 mg daily.  If patient has an increase in angina we will consider increasing Ranexa .  Hyperlipidemia LDL 63, TG 151 February 2024, at goal. - Continue atorvastatin  and Tricor .  Obesity Patient is working on weight loss.  He has been taking Mounjaro with good results. - Continue Mounjaro.  Tobacco abuse Patient continues to smoke half a pack a day.  When asked about quitting he reports "I am thinking about it." - Encouraged smoking cessation.  Disposition: Decrease isosorbide  to 15 mg daily.  Return in 6 months or sooner as needed         Signed, Lonell Rives. Adiel Erney, DNP, NP-C

## 2023-07-16 NOTE — Progress Notes (Deleted)
 Cardiology Clinic Note   Date: 07/16/2023 ID: [name], DOB 10-26-1960, MRN [id number]  Primary Cardiologist:  Antionette Kirks, MD  Chief Complaint   [name] is a 63 y.o. male who presents to the clinic today for ***  Patient Profile   [name] is followed by *** for the history outlined below.      Past medical history significant for: CAD. Remote history of stenting to LAD and mid RCA in the setting of NSTEMI. LHC 08/31/2019 (unstable angina): Proximal RCA #1 99%, #2 75%.  Proximal to mid RCA 50%.  Distal RCA 70%.  Proximal LCx 50%.  Distal LCx 50%.  Mid LCx 50%.  Mid LM 25%.  Distal LAD #1 75%, #2 75%, #3 70%, #4 50%.  Proximal to mid LAD 50%.  D1 50%.  PCI with DES 2.5 x 15 mm to proximal RCA.  More distal lesions not attempted secondary to severe calcifications. Echo 08/31/2019: EF 50 to 55%.  No RWMA.  Grade I DD.  Normal RV size/function.  Trivial MR.  Technically difficult study. LHC 09/29/2019: Proximal RCA #1 20% in-stent restenosis, #2 85%.  Proximal to mid RCA 50%.  Distal RCA 50%.  RPDA 60%.  Successful angioplasty, intravascular lithotripsy and DES 3.0 x 26 mm to proximal RCA. LHC 10/18/2019 (stable angina): Mid LM 25%.  Proximal LCx 50%.  Distal LCx 50%.  Mid LCx 50%.  Distal RCA 50%.  Proximal RCA 30%.  Proximal to mid RCA 20%.  RPDA 60%.  Distal LAD #1 75%, #2 70%, #3 50%.  Mid LAD 30%.  Ramus 80%.  Patent stents in the LAD and RCA with mild in-stent restenosis.  No targets for revascularization. LHC 05/25/2021 (angina): Mid LM 25%.  Distal LAD #1 75%, #2 70%, #3 50%.  Mid LAD 30%.  Proximal LCx 50%.  Distal LCx 50%.  Mid LCx 50%.  Proximal RCA 30%.  Proximal mid RCA 20%.  Distal RCA 60%.  RPDA 70%.  Ramus 90%.  OM 340%.  Heavily calcified coronary arteries with patent LAD and RCA stents with mild in-stent restenosis.  Stable diffuse moderate LCx disease and significant distal LAD disease.  Mild progression of disease in the distal RCA, right PDA and ramus branch.   Otherwise no significant change from prior catheterization.  No targets for revascularization.  Recommend continued aggressive medical therapy. Hypertension. Hyperlipidemia. Lipid panel 03/27/2022: LDL 63, HDL 35, TG 151, total 128. OSA. On CPAP*** GERD. T2DM. CKD stage IIIb. Tobacco abuse.  In summary, patient has a history of extensive CAD s/p LAD and RCA stenting as detailed above.  In July 2021 he underwent LHC for unstable angina with PCI to proximal RCA.  More distal lesions could not be accessed secondary to being unable to advance the balloon.  He had ongoing angina and was referred to Dr. Franky Ivanoff and underwent repeat LHC in August 2021 with successful PCI to proximal RCA.  He underwent repeat catheterization a few weeks later secondary to persistent angina with no significant change from prior cath and antianginal therapy was adjusted.  In the spring 2023 he complained of progressive exertional chest discomfort and underwent catheterization showing patent LAD and RCA stents with mild in-stent restenosis as detailed above.  Upon follow-up in January 2024 patient reported angina with higher level of activity otherwise he felt symptoms well-controlled with current antianginal therapy.  He reported dizziness with extending his neck for prolonged periods of time and underwent carotid ultrasound which showed near normal carotids  with normal flow in the vertebrals and subclavians.  Patient was last seen in the office by Katlyn West, NP on 11/15/2022 for routine follow-up.  He reported being seated [facility] ED in May secondary to dizziness and vertigo that had overall resolved.  He reported occasional dizziness with quick position changes.  He noted stable angina occurring with overexertion that quickly resolved with rest.  No other changes were made.     History of Present Illness    Today, patient ***  CAD with stable angina S/p remote LAD and RCA stenting in the setting of NSTEMI, PCI with DES to  proximal RCA July 2021, DES to proximal RCA August 2021.  Last [facility] April 2023 showed patent stents to RCA and LAD with mild in-stent restenosis otherwise stable diffuse moderate LCx disease and significant distal LAD disease with mild progression of disease in the distal RCA and right PDA and ramus branch otherwise no significant change from prior catheterization.  Patient*** - Continue aspirin , Plavix , atorvastatin , Tricor , Toprol , isosorbide , Ranexa , as needed SL NTG.  Hypertension BP today*** - Continue Toprol , lisinopril , isosorbide .  Hyperlipidemia LDL 63, TG 151 February 2024, at goal. - Continue atorvastatin  and Tricor . - Repeat lipid panel***  Dizziness Prior complaints of dizziness with extending neck for prolonged periods of time.  Carotid ultrasound was normal at that time.  Patient*** - Continue slow position changes.  Tobacco abuse Patient***  OSA Patient*** - Continue to use CPAP.***  ROS: All other systems reviewed and are otherwise negative except as noted in History of Present Illness.  EKGs/Labs Reviewed        No results found for requested labs within last 365 days.   No results found for requested labs within last 365 days.   No results found for requested labs within last 365 days.   No results found for requested labs within last 365 days.  ***  Risk Assessment/Calculations    {Does this patient have ATRIAL FIBRILLATION?:[phone]} No BP recorded.  {Refresh Note OR Click here to enter BP  :1}***        Physical Exam    VS:  There were no vitals taken for this visit. , BMI There is no height or weight on file to calculate BMI.  GEN: Well nourished, well developed, in no acute distress. Neck: No JVD or carotid bruits. Cardiac: *** RRR. *** No murmurs. No rubs or gallops.   Respiratory:  Respirations regular and unlabored. Clear to auscultation without rales, wheezing or rhonchi. GI: Soft, nontender, nondistended. Extremities: Radials/DP/PT 2+  and equal bilaterally. No clubbing or cyanosis. No edema ***  Skin: Warm and dry, no rash. Neuro: Strength intact.  Assessment & Plan   ***  Disposition: ***     {Are you ordering a CV Procedure (e.g. stress test, cath, DCCV, TEE, etc)?   Press F2        :[id number]}   Signed, Lonell Rives. Maricia Scotti, DNP, NP-C

## 2023-07-17 ENCOUNTER — Ambulatory Visit: Admitting: Student

## 2023-07-22 ENCOUNTER — Encounter: Payer: Self-pay | Admitting: Student

## 2023-07-22 ENCOUNTER — Ambulatory Visit: Attending: Student | Admitting: Student

## 2023-07-22 VITALS — BP 105/70 | HR 81 | Resp 18 | Ht 66.0 in | Wt 199.0 lb

## 2023-07-22 DIAGNOSIS — I10 Essential (primary) hypertension: Secondary | ICD-10-CM

## 2023-07-22 DIAGNOSIS — R42 Dizziness and giddiness: Secondary | ICD-10-CM | POA: Diagnosis not present

## 2023-07-22 DIAGNOSIS — E785 Hyperlipidemia, unspecified: Secondary | ICD-10-CM | POA: Diagnosis not present

## 2023-07-22 DIAGNOSIS — I25118 Atherosclerotic heart disease of native coronary artery with other forms of angina pectoris: Secondary | ICD-10-CM

## 2023-07-22 DIAGNOSIS — Z720 Tobacco use: Secondary | ICD-10-CM

## 2023-07-22 MED ORDER — ISOSORBIDE MONONITRATE ER 30 MG PO TB24
15.0000 mg | ORAL_TABLET | Freq: Every day | ORAL | 3 refills | Status: AC
Start: 2023-07-22 — End: ?

## 2023-07-22 NOTE — Patient Instructions (Signed)
 Medication Instructions:  Your physician recommends the following medication changes.  DECREASE: Isosorbide  to 15 mg (1/2 of 30 mg tablet) once daily  *If you need a refill on your cardiac medications before your next appointment, please call your pharmacy*  Lab Work: None ordered at this time  If you have labs (blood work) drawn today and your tests are completely normal, you will receive your results only by: MyChart Message (if you have MyChart) OR A paper copy in the mail If you have any lab test that is abnormal or we need to change your treatment, we will call you to review the results.  Testing/Procedures: None ordered at this time   Follow-Up: At [facility], you and your health needs are our priority.  As part of our continuing mission to provide you with exceptional heart care, our providers are all part of one team.  This team includes your primary Cardiologist (physician) and Advanced Practice Providers or APPs (Physician Assistants and Nurse Practitioners) who all work together to provide you with the care you need, when you need it.  Your next appointment:   6 month(s)  Provider:   You may see Antionette Kirks, MD or one of the following Advanced Practice Providers on your designated Care Team:   Laneta Pintos, NP Gildardo Labrador, PA-C Varney Gentleman, PA-C Cadence Alexandria, PA-C Ronald Cockayne, NP Morey Ar, NP    We recommend signing up for the patient portal called "MyChart".  Sign up information is provided on this After Visit Summary.  MyChart is used to connect with patients for Virtual Visits (Telemedicine).  Patients are able to view lab/test results, encounter notes, upcoming appointments, etc.  Non-urgent messages can be sent to your provider as well.   To learn more about what you can do with MyChart, go to [email or web address].

## 2023-08-31 ENCOUNTER — Other Ambulatory Visit: Payer: Self-pay | Admitting: Cardiovascular Disease

## 2023-09-24 ENCOUNTER — Other Ambulatory Visit: Payer: Self-pay | Admitting: Cardiovascular Disease

## 2023-10-02 ENCOUNTER — Encounter: Payer: Self-pay | Admitting: Internal Medicine

## 2023-10-02 ENCOUNTER — Ambulatory Visit (INDEPENDENT_AMBULATORY_CARE_PROVIDER_SITE_OTHER): Admitting: Internal Medicine

## 2023-10-02 VITALS — BP 130/84 | HR 85 | Ht 66.0 in | Wt 195.0 lb

## 2023-10-02 DIAGNOSIS — E782 Mixed hyperlipidemia: Secondary | ICD-10-CM

## 2023-10-02 DIAGNOSIS — E1122 Type 2 diabetes mellitus with diabetic chronic kidney disease: Secondary | ICD-10-CM

## 2023-10-02 DIAGNOSIS — Z7984 Long term (current) use of oral hypoglycemic drugs: Secondary | ICD-10-CM

## 2023-10-02 DIAGNOSIS — N1831 Chronic kidney disease, stage 3a: Secondary | ICD-10-CM

## 2023-10-02 DIAGNOSIS — Z794 Long term (current) use of insulin: Secondary | ICD-10-CM

## 2023-10-02 DIAGNOSIS — E1159 Type 2 diabetes mellitus with other circulatory complications: Secondary | ICD-10-CM | POA: Diagnosis not present

## 2023-10-02 DIAGNOSIS — E1165 Type 2 diabetes mellitus with hyperglycemia: Secondary | ICD-10-CM

## 2023-10-02 DIAGNOSIS — Z7985 Long-term (current) use of injectable non-insulin antidiabetic drugs: Secondary | ICD-10-CM

## 2023-10-02 LAB — LIPID PANEL
Cholesterol: 226 mg/dL — ABNORMAL HIGH (ref <200)
HDL: 41 mg/dL (ref >=40)
LDL Cholesterol (Calc): 140 mg/dL — ABNORMAL HIGH
Non-HDL Cholesterol (Calc): 185 mg/dL — ABNORMAL HIGH (ref <130)
Total CHOL/HDL Ratio: 5.5 (calc) — ABNORMAL HIGH (ref <5.0)
Triglycerides: 291 mg/dL — ABNORMAL HIGH (ref <150)

## 2023-10-02 LAB — POCT GLYCOSYLATED HEMOGLOBIN (HGB A1C): Hemoglobin A1C: 6.9 % — AB (ref 4.0–5.6)

## 2023-10-02 MED ORDER — TRESIBA FLEXTOUCH 100 UNIT/ML ~~LOC~~ SOPN: 22.0000 [IU] | PEN_INJECTOR | Freq: Every day | SUBCUTANEOUS | 3 refills | Status: AC

## 2023-10-02 MED ORDER — DAPAGLIFLOZIN PROPANEDIOL 10 MG PO TABS: 10.0000 mg | ORAL_TABLET | Freq: Every day | ORAL | 3 refills | Status: AC

## 2023-10-02 MED ORDER — BD PEN NEEDLE MINI U/F 31G X 5 MM MISC: 1.0000 | Freq: Four times a day (QID) | 3 refills | Status: AC

## 2023-10-02 MED ORDER — TIRZEPATIDE 10 MG/0.5ML ~~LOC~~ SOAJ: 10.0000 mg | SUBCUTANEOUS | 3 refills | Status: AC

## 2023-10-02 MED ORDER — PIOGLITAZONE HCL 15 MG PO TABS: 15.0000 mg | ORAL_TABLET | Freq: Every day | ORAL | 3 refills | Status: AC

## 2023-10-02 MED ORDER — NOVOLOG FLEXPEN 100 UNIT/ML ~~LOC~~ SOPN: PEN_INJECTOR | SUBCUTANEOUS | 3 refills | Status: AC

## 2023-10-02 NOTE — Progress Notes (Signed)
 Name: [name]  Age/ Sex: 63 y.o., male   MRN/ DOB: [id number], April 15, 1960     PCP: Glover Lenis, MD   Reason for Endocrinology Evaluation: Type 2 Diabetes Mellitus  Initial Endocrine Consultative Visit: 11/03/2019    PATIENT IDENTIFIER: [name] is a 63 y.o. male with a past medical history of T2DM, CAD, OSA and HTN.  The patient has followed with Endocrinology clinic since 11/03/2019 for consultative assistance with management of his diabetes.  DIABETIC HISTORY:  Mr. Pickup was diagnosed with DM many years ago. Trulicity  has caused nausea. His hemoglobin A1c has ranged from 9.0% in 12/2019, peaking at 12.1% in 10/2019.  No personal hx of pancreatitis    On his initial visit to our clinic he had an A1c of 12.1 %. He was on metformin , Glipizide  and pioglitazone . We increased Glipizide      By 12/2019 reduced metformin  due to low GFG, stopped by October 2023 due to diarrhea and low GFR  Stopped Glipizide  and started basal insulin  06/2020 as well as Farxiga     Started Trulicity  in May 2023 but was unable to tolerate due to abdominal pain and stopped by October 2023  He stopped metformin  due to diarrhea 12/2022 Started Ozempic  12/2022 but did not tolerate   He was started on Glipizide  XL 2.5 mg daily by his PCP which results in hypoglycemia as low as 59 mg/dL   Mounjaro  started 02/2023   Prandial insulin  was started 11/2022 due to persistent hyperglycemia  SUBJECTIVE:   During the last visit (05/30/2023): A1c 11.7%    Today (10/02/2023): Mr. [name] is here for a follow up on diabetes.  He is accompanied by his wife [name]. He checks his blood sugars occasionally . The patient has had hypoglycemic episodes since the last clinic visit.   Patient continues to follow-up with nephrology (Dr. Dennise with [facility]) He continues to follow-up with cardiology for CAD, HTN, and dyslipidemia Patient continues with weight loss Denies nausea or vomiting  Rare   constipation, uses stool softenrs      HOME ENDOCRINE  REGIMEN:  Mounjaro  7.5 mg weekly Farxiga  10 mg daily in the morning  Pioglitazone  45 mg daily  Tresiba  22 units daily NovoLog  6 units 3 times daily before every meal CF: NovoLog  (BG -130/30) TIDQAC and QHS Atorvastatin  80 mg daily Fenofibrate  145 mg daily      Statin: yes ACE-I/ARB: no    CONTINUOUS GLUCOSE MONITORING RECORD INTERPRETATION    Dates of Recording: 8/1-8/14/2025  Sensor description: Dexcom  Results statistics:   CGM use % of time 84  Average and SD 161/33  Time in range 73 %  % Time Above 180 26  % Time above 250 1  % Time Below target 0   Glycemic patterns summary: BGs are optimal overnight and most of the day  Hyperglycemic episodes rare-postprandial   Hypoglycemic episodes occurred N/A  Overnight periods: Optimal    DIABETIC COMPLICATIONS: Microvascular complications:   Denies: Denies: CKD, retinopathy, neuropathy  Last Eye Exam: Completed 01/2021  Macrovascular complications:  CAD ( S/P PCI) Denies: CVA, PVD   HISTORY:  Past Medical History:  Past Medical History:  Diagnosis Date   Anemia in chronic kidney disease    Bilateral hip pain    a. 05/2021 ABIs: nl bilaterally.   Chronic metabolic acidosis    CKD (chronic kidney disease), stage III (HCC)    Coronary artery disease    a.  H/o NSTEMI s/p LAD/RCA stenting; b. 08/2019  PCI: RCA 99p (2.5x15 Resolute Onyx DES), 75p/m, mild ISR; c. 09/2019 PCI: 85p - heavily calcified (intravascular lithotripsy & 3.0x26 Resolute Onyx DES); d. 10/18/19 Relook cath: stable anatomy->Med Rx;  e. 05/2021 Cath: LM 25, LAD 65m ISR, 75/70/50d, RI small - 90, LCX 50p/m/d, OM3 40, RCA 30p ISR, 20p/m ISR, 60d, RPDA 70-->Med Rx.   Diabetes mellitus without complication (HCC)    Diastolic dysfunction    a. 08/2019 Echo: EF 50-55%, no rwma, Gr1 DD, nl RV size/fxn. Triv MR.   GERD (gastroesophageal reflux disease)    Hyperlipidemia    Hypertension     NSTEMI (non-ST elevated myocardial infarction) (HCC) 08/2019   Sleep apnea    Tobacco abuse    Past Surgical History:  Past Surgical History:  Procedure Laterality Date   APPENDECTOMY     CARDIAC CATHETERIZATION     COLONOSCOPY     COLONOSCOPY WITH PROPOFOL  N/A 09/16/2017   Procedure: COLONOSCOPY WITH PROPOFOL ;  Surgeon: Toledo, Ladell POUR, MD;  Location: [facility] ENDOSCOPY;  Service: Gastroenterology;  Laterality: N/A;  (+) DM - oral   COLONOSCOPY WITH PROPOFOL  N/A 04/29/2023   Procedure: COLONOSCOPY WITH PROPOFOL ;  Surgeon: Toledo, Ladell POUR, MD;  Location: [facility] ENDOSCOPY;  Service: Gastroenterology;  Laterality: N/A;   CORONARY STENT INTERVENTION N/A 08/31/2019   Procedure: CORONARY STENT INTERVENTION;  Surgeon: Jeff Cara BIRCH, MD;  Location: [facility] INVASIVE CV LAB;  Service: Cardiovascular;  Laterality: N/A;   CORONARY STENT INTERVENTION  09/29/2019   CORONARY STENT INTERVENTION N/A 09/29/2019   Procedure: CORONARY STENT INTERVENTION;  Surgeon: Jeff Deatrice LABOR, MD;  Location: [facility] INVASIVE CV LAB;  Service: Cardiovascular;  Laterality: N/A;   EXPLORATORY LAPAROTOMY     EYE SURGERY     HERNIA REPAIR     LEFT HEART CATH AND CORONARY ANGIOGRAPHY N/A 08/31/2019   Procedure: LEFT HEART CATH AND CORONARY ANGIOGRAPHY possible PCI and stent;  Surgeon: Jeff Cara BIRCH, MD;  Location: [facility] INVASIVE CV LAB;  Service: Cardiovascular;  Laterality: N/A;   LEFT HEART CATH AND CORONARY ANGIOGRAPHY Left 10/18/2019   Procedure: LEFT HEART CATH AND CORONARY ANGIOGRAPHY;  Surgeon: Jeff Deatrice LABOR, MD;  Location: [facility] INVASIVE CV LAB;  Service: Cardiovascular;  Laterality: Left;   LEFT HEART CATH AND CORONARY ANGIOGRAPHY Left 05/25/2021   Procedure: LEFT HEART CATH AND CORONARY ANGIOGRAPHY;  Surgeon: Jeff Deatrice LABOR, MD;  Location: [facility] INVASIVE CV LAB;  Service: Cardiovascular;  Laterality: Left;   POLYPECTOMY  04/29/2023   Procedure: POLYPECTOMY;  Surgeon: Toledo, Jeff K, MD;  Location: [facility] ENDOSCOPY;   Service: Gastroenterology;;   Social History:  reports that he has been smoking cigarettes. He started smoking about 55 years ago. He has a 166.8 pack-year smoking history. He has never used smokeless tobacco. He reports that he does not currently use alcohol. He reports that he does not use drugs. Family History:  Family History  Problem Relation Age of Onset   Heart disease Father    Heart attack Father    Hypertension Father    Diabetes Father      HOME MEDICATIONS: Allergies as of 10/02/2023       Reactions   Shellfish Allergy Anaphylaxis   Penicillins Swelling   Childhood   Lisinopril  Other (See Comments)   Hyperkalemia, increased creatinine   Erythromycin Rash, Other (See Comments)   Unknown        Medication List        Accurate as of October 02, 2023  9:41 AM. If you have any  questions, ask your nurse or doctor.          Accu-Chek FastClix Lancets Misc Use as instructed to check blood sugar up to 2 times daily   Accu-Chek Softclix Lancets lancets Use to check blood sugars 2 times daily   Accu-Chek Guide Me w/Device Kit Use as instructed to check blood sugar up to 2 times daily   Accu-Chek Guide test strip Generic drug: glucose blood USE AS INSTRUCTED TO CHECK BLOOD SUGAR UP TO 2 TIMES DAILY   Accu-Chek Guide test strip Generic drug: glucose blood 1 each by Other route in the morning and at bedtime. Use as instructed   aspirin  EC 81 MG tablet Take 81 mg by mouth daily.   atorvastatin  80 MG tablet Commonly known as: LIPITOR  Take 1 tablet (80 mg total) by mouth daily.   B-D UF III MINI PEN NEEDLES 31G X 5 MM Misc Generic drug: Insulin  Pen Needle Inject 1 Device into the skin in the morning, at noon, in the evening, and at bedtime.   clopidogrel  75 MG tablet Commonly known as: PLAVIX  TAKE 1 TABLET BY MOUTH EVERY DAY   cyanocobalamin  1000 MCG tablet Commonly known as: VITAMIN B12 Take 1,000 mcg by mouth daily.   cyclobenzaprine  5 MG  tablet Commonly known as: FLEXERIL  Take 1 tablet (5 mg total) by mouth 3 (three) times daily as needed for muscle spasms.   dapagliflozin  propanediol 10 MG Tabs tablet Commonly known as: Farxiga  Take 1 tablet (10 mg total) by mouth daily before breakfast.   Dexcom G7 Sensor Misc CHANGE SENSORS EVERY 10 DAYS   EPINEPHrine 0.3 mg/0.3 mL Soaj injection Commonly known as: EPI-PEN Inject 0.3 mg into the muscle as needed for anaphylaxis.   erythromycin ophthalmic ointment   famotidine  20 MG tablet Commonly known as: PEPCID  Take 20 mg by mouth daily.   fenofibrate  145 MG tablet Commonly known as: TRICOR  Take 1 tablet (145 mg total) by mouth daily.   Fish Oil 1000 MG Caps Take 1,000 mg by mouth daily.   fluticasone 50 MCG/ACT nasal spray Commonly known as: FLONASE Place into both nostrils.   isosorbide  mononitrate 30 MG 24 hr tablet Commonly known as: IMDUR  Take 0.5 tablets (15 mg total) by mouth daily.   levocetirizine 5 MG tablet Commonly known as: XYZAL Take 1 tablet by mouth every evening.   lisinopril  2.5 MG tablet Commonly known as: ZESTRIL  TAKE 1 TABLET BY MOUTH EVERY DAY   metoprolol  succinate 25 MG 24 hr tablet Commonly known as: TOPROL -XL TAKE 1/2 TABLET BY MOUTH DAILY   nitroGLYCERIN  0.4 MG SL tablet Commonly known as: NITROSTAT  Place 1 tablet (0.4 mg total) under the tongue every 5 (five) minutes as needed for chest pain.   NovoLOG  FlexPen 100 UNIT/ML FlexPen Generic drug: insulin  aspart Max daily 100 units   oxyCODONE  5 MG immediate release tablet Commonly known as: Oxy IR/ROXICODONE  Take 5 mg by mouth every 8 (eight) hours as needed.   pioglitazone  45 MG tablet Commonly known as: ACTOS  Take 1 tablet (45 mg total) by mouth daily.   ranolazine  500 MG 12 hr tablet Commonly known as: RANEXA  TAKE 1 TABLET BY MOUTH TWICE A DAY   tirzepatide  7.5 MG/0.5ML Pen Commonly known as: MOUNJARO  Inject 7.5 mg into the skin once a week.   Mounjaro  5  MG/0.5ML Pen Generic drug: tirzepatide  Inject 5 mg into the skin once a week.   traMADol 50 MG tablet Commonly known as: ULTRAM Oral for 5 Days   Tresiba   FlexTouch 100 UNIT/ML FlexTouch Pen Generic drug: insulin  degludec Inject 44 Units into the skin daily. What changed: how much to take   ZINC PO Take 1 capsule by mouth daily.         OBJECTIVE:   Vital Signs: BP 130/84 (BP Location: Left Arm, Patient Position: Sitting, Cuff Size: Normal)   Pulse 85   Ht 5' 6 (1.676 m)   Wt 195 lb (88.5 kg)   SpO2 98%   BMI 31.47 kg/m   Wt Readings from Last 3 Encounters:  10/02/23 195 lb (88.5 kg)  07/22/23 199 lb (90.3 kg)  06/20/23 202 lb (91.6 kg)     Exam: General: Pt appears well and is in NAD  Lungs: Coarse breathing sounds   Heart: RRR   Extremities: No pretibial edema.  Neuro: MS is good with appropriate affect, pt is alert and Ox3       DM foot exam: 10/02/2023   The skin of the feet is intact without sores or ulcerations. The pedal pulses are 1+ on right and 1+ on left. The sensation is intact to a screening 5.07, 10 gram monofilament bilaterally    DATA REVIEWED:  Lab Results  Component Value Date   HGBA1C 6.9 (A) 10/02/2023   HGBA1C 7.1 (A) 05/30/2023   HGBA1C 11.7 (A) 03/06/2023    Latest Reference Range & Units 10/02/23 10:09  Total CHOL/HDL Ratio <5.0 (calc) 5.5 (H)  Cholesterol <200 mg/dL 773 (H)  HDL Cholesterol > OR = 40 mg/dL 41  LDL Cholesterol (Calc) mg/dL (calc) 859 (H)  Non-HDL Cholesterol (Calc) <130 mg/dL (calc) 814 (H)  Triglycerides <150 mg/dL 708 (H)  (H): Data is abnormally high  07/22/2023 [facility]  through nephrology Glucose 175 BUN 31 Cr.  2.15 GFR 34 PTH 54   ASSESSMENT / PLAN / RECOMMENDATIONS:   1) Type 2 Diabetes Mellitus, optimally controlled, with CKD III and macrovascular  complications - Most recent A1c of 6.9 %. Goal A1c < 7.0.   -A1c has improved dramatically, A1c trended down from 11.7% to 7.1% -He is  intolerant to metformin  due to diarrhea -He is intolerant to Trulicity  and Ozempic  due to abdominal pain -He is tolerating Mounjaro  without side effects, will increase as below -I will also decrease his basal/prandial insulin  as below to prevent hypoglycemia  MEDICATIONS: -Increase Mounjaro  10 (mg weekly -Continue Farxiga  10 mg daily in the morning  -Continue Pioglitazone  45 mg daily  -Decrease Tresiba  22 units daily -Decrease NovoLog  6 units 3 times daily before every meal -Continue CF: NovoLog  (BG -130/30) TIDQAC and QHS   EDUCATION / INSTRUCTIONS: BG monitoring instructions: Patient is instructed to check his blood sugars 4 times a day, before breakfast and supper. Call [facility] if: BG persistently < 70  I reviewed the Rule of 15 for the treatment of hypoglycemia in detail with the patient. Literature supplied.     2) Diabetic complications:  Eye: Does not have known diabetic retinopathy.  Neuro/ Feet: Does not have known diabetic peripheral neuropathy .  Renal: Patient does not have known baseline CKD. He   is not a candidate for  ACEI/ARB at present due to hyperkalemia . MA/Cr ratio is normal    3) Mixed Dyslipidemia:    -TG improved from 935 in 11/2020 , to 177 mg/DL 88/7976 -Repeat lipid panel continues to show elevated triglycerides and LDL, will change atorvastatin  to rosuvastatin  as below    Medication Stop atorvastatin  80 mg daily Start rosuvastatin  40 mg daily Continue fenofibrate   145 mg daily   F/U in 4 months   Signed electronically by: Stefano Redgie Butts, MD  [facility]  [facility] [address] [place], [place] [address] Phone: [phone] FAX: [phone]   CC: Glover Lenis, MD [address] [place] [address] Phone: [phone]  Fax: [phone]  Return to Endocrinology clinic as below: No future appointments.

## 2023-10-02 NOTE — Patient Instructions (Signed)
-   Increase Mounjaro  10 mg once weekly  - Continue Farxiga  10 mg daily in the morning  - Decrease  Pioglitazone  15 mg daily  - Continue Tresiba  22 units daily  - Decrease Novolog  4 units with each meal  -Novolog  correctional insulin : ADD extra units on insulin  to your meal-time Novolog  dose if your blood sugars are higher than 160. Use the scale below to help guide you:   Blood sugar before meal Number of units to inject  Less than 160 0 unit  161 -  190 1 units  191 -  220 2 units  221 -  250 3 units  251 -  280 4 units  281 -  310 5 units  311 -  340 6 units  341 -  370 7 units  371 -  400 8 units  401 - 430 9 units  431 - 460 10 units  461 - 490 11 units   491 - 520 12 units        HOW TO TREAT LOW BLOOD SUGARS (Blood sugar LESS THAN 70 MG/DL) Please follow the RULE OF 15 for the treatment of hypoglycemia treatment (when your (blood sugars are less than 70 mg/dL)   STEP 1: Take 15 grams of carbohydrates when your blood sugar is low, which includes:  3-4 GLUCOSE TABS  OR 3-4 OZ OF JUICE OR REGULAR SODA OR ONE TUBE OF GLUCOSE GEL    STEP 2: RECHECK blood sugar in 15 MINUTES STEP 3: If your blood sugar is still low at the 15 minute recheck --> then, go back to STEP 1 and treat AGAIN with another 15 grams of carbohydrates.

## 2023-10-03 ENCOUNTER — Ambulatory Visit: Payer: Self-pay | Admitting: Internal Medicine

## 2023-10-03 MED ORDER — ROSUVASTATIN CALCIUM 40 MG PO TABS: 40.0000 mg | ORAL_TABLET | Freq: Every day | ORAL | 3 refills | Status: AC

## 2023-10-03 MED ORDER — FENOFIBRATE 145 MG PO TABS: 145.0000 mg | ORAL_TABLET | Freq: Every day | ORAL | 3 refills | Status: AC

## 2023-10-03 NOTE — Telephone Encounter (Signed)
 Please let the patient know that his cholesterol is very high, and the atorvastatin  will need to be changed to rosuvastatin  40 mg daily    Please asked the patient to take his cholesterol medications at night  Continue fenofibrate  daily   Thanks

## 2023-12-02 DIAGNOSIS — D72829 Elevated white blood cell count, unspecified: Secondary | ICD-10-CM | POA: Diagnosis not present

## 2023-12-10 DIAGNOSIS — E1159 Type 2 diabetes mellitus with other circulatory complications: Secondary | ICD-10-CM | POA: Diagnosis not present

## 2023-12-16 DIAGNOSIS — E119 Type 2 diabetes mellitus without complications: Secondary | ICD-10-CM | POA: Diagnosis not present

## 2023-12-16 DIAGNOSIS — B029 Zoster without complications: Secondary | ICD-10-CM | POA: Diagnosis not present

## 2023-12-16 DIAGNOSIS — I10 Essential (primary) hypertension: Secondary | ICD-10-CM | POA: Diagnosis not present

## 2023-12-16 DIAGNOSIS — E7800 Pure hypercholesterolemia, unspecified: Secondary | ICD-10-CM | POA: Diagnosis not present

## 2023-12-30 DIAGNOSIS — M792 Neuralgia and neuritis, unspecified: Secondary | ICD-10-CM | POA: Diagnosis not present

## 2023-12-30 NOTE — Progress Notes (Signed)
 Chief Complaint  Patient presents with  . Abdominal Pain    LUQ, axillary, burning sensation - x 1 month 3 weeks ago eval at [facility], treated for shingles    Guerin Lashomb is a 63 y.o. male in clinic today for an acute visit.  HPI: History of Present Illness [name] is a 63 year old male who presents with abdominal and back pain following treatment for shingles.  He experiences pain that extends from his abdomen to his back and up his arm, describing it as 'real sore' and 'like somebody had a blowtorch'. The pain began before his visit to urgent care, with side pain initially and underarm pain developing in the last week.  He was treated for shingles at an urgent care facility and was prescribed an antiviral, completing a 10-day course approximately one and a half to two weeks ago. He notes that the medication may have helped 'a little', but the pain persists.  No rash was present at any point, both during the urgent care visit and currently. He had chickenpox as a child but has not received the shingles vaccine.  The pain is constant and is worse when touching it. He finds it difficult to tolerate wearing a shirt due to the pain. No nausea, vomiting, diarrhea, or constipation. He has not started any new medications recently. He has been using ibuprofen, which provides some relief but does not eliminate the pain.   ROS: Review of Systems  Gastrointestinal:  Positive for abdominal pain. Negative for constipation, diarrhea, nausea and vomiting.  Musculoskeletal:  Positive for back pain.     Allergies  Allergen Reactions  . Penicillins Swelling  . Shellfish Containing Products Swelling    Past Medical History:  Diagnosis Date  . Arthritis   . Coronary artery disease   . Diabetes mellitus type 2, uncomplicated (CMS/HHS-HCC)   . Gastroesophageal reflux disease without esophagitis 07/16/2017  . Hyperlipidemia   . Hypertension   . Kidney disease   . NSTEMI (non-ST elevated myocardial  infarction) (CMS/HHS-HCC)   . OSA on CPAP 07/16/2017  . Tobacco abuse 07/16/2017     BP (!) 148/76 (BP Location: Left upper arm, Patient Position: Sitting, BP Cuff Size: Adult)   Pulse 68   Ht 165.1 cm (5' 5)   Wt 89.5 kg (197 lb 6.4 oz)   SpO2 97%   BMI 32.85 kg/m    Physical Exam Constitutional:      Appearance: Normal appearance.  Cardiovascular:     Rate and Rhythm: Normal rate and regular rhythm.     Heart sounds: Normal heart sounds.  Pulmonary:     Effort: Pulmonary effort is normal.     Breath sounds: Normal breath sounds.  Abdominal:     General: Abdomen is flat. Bowel sounds are normal.     Tenderness: There is no abdominal tenderness.  Skin:    Comments: No skin lesions but significant tenderness to light touch in the LUQ, left lateral side, into the back, and up into the armpit.   Neurological:     Mental Status: He is alert.    Assessment & Plan Neuropathic Pain Persistent LUQ skin, lateral side, back, and axillary pain. Patient was seen in the urgent care 3 weeks ago and diagnosed with shingles. He took 10 days of an antiviral and had minimal improvement. On exam no abdominal pain but tenderness to light touch on left lateral abdomen, left sided back, and under the armpit. - Will send in Gabapentin 300 mg  TID for nerve pain. - If pain persists despite gabapentin, Valtrex, and there being no rash present. Will consider thoracic imaging to rule out nerve impingement.  - Instructed patient to follow up with Dr. Glover for further investigation if no improvement.       Powell Snell, PA-C

## 2024-01-02 NOTE — Progress Notes (Deleted)
 Cardiology Clinic Note   Date: 01/02/2024 ID: [name], DOB 09/03/1960, MRN [id number]  Primary Cardiologist:  Deatrice Cage, MD  Chief Complaint   [name] is a 63 y.o. male who presents to the clinic today for ***  Patient Profile   [name] is followed by *** for the history outlined below.       Past medical history significant for: CAD. Remote history of stenting to LAD and mid RCA in the setting of NSTEMI. LHC 08/31/2019 (unstable angina): Proximal RCA #1 99%, #2 75%.  Proximal to mid RCA 50%.  Distal RCA 70%.  Proximal LCx 50%.  Distal LCx 50%.  Mid LCx 50%.  Mid LM 25%.  Distal LAD #1 75%, #2 75%, #3 70%, #4 50%.  Proximal to mid LAD 50%.  D1 50%.  PCI with DES 2.5 x 15 mm to proximal RCA.  More distal lesions not attempted secondary to severe calcifications. Echo 08/31/2019: EF 50 to 55%.  No RWMA.  Grade I DD.  Normal RV size/function.  Trivial MR.  Technically difficult study. LHC 09/29/2019: Proximal RCA #1 20% in-stent restenosis, #2 85%.  Proximal to mid RCA 50%.  Distal RCA 50%.  RPDA 60%.  Successful angioplasty, intravascular lithotripsy and DES 3.0 x 26 mm to proximal RCA. LHC 10/18/2019 (stable angina): Mid LM 25%.  Proximal LCx 50%.  Distal LCx 50%.  Mid LCx 50%.  Distal RCA 50%.  Proximal RCA 30%.  Proximal to mid RCA 20%.  RPDA 60%.  Distal LAD #1 75%, #2 70%, #3 50%.  Mid LAD 30%.  Ramus 80%.  Patent stents in the LAD and RCA with mild in-stent restenosis.  No targets for revascularization. LHC 05/25/2021 (angina): Mid LM 25%.  Distal LAD #1 75%, #2 70%, #3 50%.  Mid LAD 30%.  Proximal LCx 50%.  Distal LCx 50%.  Mid LCx 50%.  Proximal RCA 30%.  Proximal mid RCA 20%.  Distal RCA 60%.  RPDA 70%.  Ramus 90%.  OM 340%.  Heavily calcified coronary arteries with patent LAD and RCA stents with mild in-stent restenosis.  Stable diffuse moderate LCx disease and significant distal LAD disease.  Mild progression of disease in the distal RCA, right PDA and ramus branch.   Otherwise no significant change from prior catheterization.  No targets for revascularization.  Recommend continued aggressive medical therapy. Hypertension. Hyperlipidemia. Lipid panel 11/12/2023: LDL 78, HDL 44, TG 146, total 151. OSA. Not on CPAP. GERD. T2DM. CKD stage IIIb. Tobacco abuse.  In summary, patient has a history of extensive CAD s/p LAD and RCA stenting as detailed above.  In July 2021 he underwent LHC for unstable angina with PCI to proximal RCA.  More distal lesions could not be accessed secondary to being unable to advance the balloon.  He had ongoing angina and was referred to Dr. Ricka and underwent repeat LHC in August 2021 with successful PCI to proximal RCA.  He underwent repeat catheterization a few weeks later secondary to persistent angina with no significant change from prior cath and antianginal therapy was adjusted.  In the spring 2023 he complained of progressive exertional chest discomfort and underwent catheterization showing patent LAD and RCA stents with mild in-stent restenosis as detailed above.  Upon follow-up in January 2024 patient reported angina with higher level of activity otherwise he felt symptoms well-controlled with current antianginal therapy.  He reported dizziness with extending his neck for prolonged periods of time and underwent carotid ultrasound which showed near  normal carotids with normal flow in the vertebrals and subclavians.  Patient was seen in the clinic on 11/15/2022 for routine follow-up.  He reported being seen at  [facility] ED in May secondary to dizziness and vertigo that had overall resolved.  He reported occasional dizziness with quick position changes.  He noted stable angina occurring with overexertion that quickly resolved with rest.  No other changes were made.   Patient was last seen in the office by me on 07/22/2023 for routine follow-up.  He reported stable angina with overexertion resolving with rest.  Otherwise no new cardiac  complaints.  He reported soft BP with mild dizziness.  Orthostatics were negative.  He was instructed to decrease isosorbide  to 15 mg daily.  He was encouraged to continue to work on weight loss and smoking cessation.     History of Present Illness    Today, patient ***  CAD with stable angina S/p remote LAD and RCA stenting in the setting of NSTEMI, PCI with DES to proximal RCA July 2021, DES to proximal RCA August 2021.  Last [facility] April 2023 showed patent stents to RCA and LAD with mild in-stent restenosis otherwise stable diffuse moderate LCx disease and significant distal LAD disease with mild progression of disease in the distal RCA and right PDA and ramus branch otherwise no significant change from prior catheterization.  Patient*** - Continue aspirin , Plavix , rosuvastatin , Tricor , Toprol , isosorbide , Ranexa , as needed SL NTG.   Hypertension/dizziness BP today***.   - Continue Toprol , lisinopril , isosorbide .   Hyperlipidemia LDL 78 September 2025, not at goal. - Continue rosuvastatin  and Tricor . - Add Zetia 10 mg daily***   Obesity Patient is working on weight loss.  He has been taking Mounjaro  with good results. - Continue Mounjaro .***   Tobacco abuse Patient*** - Encouraged smoking cessation.  ROS: All other systems reviewed and are otherwise negative except as noted in History of Present Illness.  EKGs/Labs Reviewed        No results found for requested labs within last 365 days.   No results found for requested labs within last 365 days.   No results found for requested labs within last 365 days.   No results found for requested labs within last 365 days.  ***  Risk Assessment/Calculations    {Does this patient have ATRIAL FIBRILLATION?:[id number]} No BP recorded.  {Refresh Note OR Click here to enter BP  :1}***        Physical Exam    VS:  There were no vitals taken for this visit. , BMI There is no height or weight on file to calculate BMI.  GEN: Well  nourished, well developed, in no acute distress. Neck: No JVD or carotid bruits. Cardiac: *** RRR. *** No murmur. No rubs or gallops.   Respiratory:  Respirations regular and unlabored. Clear to auscultation without rales, wheezing or rhonchi. GI: Soft, nontender, nondistended. Extremities: Radials/DP/PT 2+ and equal bilaterally. No clubbing or cyanosis. No edema ***  Skin: Warm and dry, no rash. Neuro: Strength intact.  Assessment & Plan   ***  Disposition: ***     {Are you ordering a CV Procedure (e.g. stress test, cath, DCCV, TEE, etc)?   Press F2        :[id number]}   Signed, Barnie HERO. Carrin Vannostrand, DNP, NP-C

## 2024-01-05 ENCOUNTER — Ambulatory Visit: Admitting: Student

## 2024-01-08 NOTE — Progress Notes (Deleted)
 Cardiology Clinic Note   Date: 01/08/2024 ID: [name], DOB 01/27/1961, MRN [id number]  Primary Cardiologist:  Deatrice Cage, MD  Chief Complaint   [name] is a 63 y.o. male who presents to the clinic today for ***  Patient Profile   [name] is followed by *** for the history outlined below.       Past medical history significant for: CAD. Remote history of stenting to LAD and mid RCA in the setting of NSTEMI. LHC 08/31/2019 (unstable angina): Proximal RCA #1 99%, #2 75%.  Proximal to mid RCA 50%.  Distal RCA 70%.  Proximal LCx 50%.  Distal LCx 50%.  Mid LCx 50%.  Mid LM 25%.  Distal LAD #1 75%, #2 75%, #3 70%, #4 50%.  Proximal to mid LAD 50%.  D1 50%.  PCI with DES 2.5 x 15 mm to proximal RCA.  More distal lesions not attempted secondary to severe calcifications. Echo 08/31/2019: EF 50 to 55%.  No RWMA.  Grade I DD.  Normal RV size/function.  Trivial MR.  Technically difficult study. LHC 09/29/2019: Proximal RCA #1 20% in-stent restenosis, #2 85%.  Proximal to mid RCA 50%.  Distal RCA 50%.  RPDA 60%.  Successful angioplasty, intravascular lithotripsy and DES 3.0 x 26 mm to proximal RCA. LHC 10/18/2019 (stable angina): Mid LM 25%.  Proximal LCx 50%.  Distal LCx 50%.  Mid LCx 50%.  Distal RCA 50%.  Proximal RCA 30%.  Proximal to mid RCA 20%.  RPDA 60%.  Distal LAD #1 75%, #2 70%, #3 50%.  Mid LAD 30%.  Ramus 80%.  Patent stents in the LAD and RCA with mild in-stent restenosis.  No targets for revascularization. LHC 05/25/2021 (angina): Mid LM 25%.  Distal LAD #1 75%, #2 70%, #3 50%.  Mid LAD 30%.  Proximal LCx 50%.  Distal LCx 50%.  Mid LCx 50%.  Proximal RCA 30%.  Proximal mid RCA 20%.  Distal RCA 60%.  RPDA 70%.  Ramus 90%.  OM 340%.  Heavily calcified coronary arteries with patent LAD and RCA stents with mild in-stent restenosis.  Stable diffuse moderate LCx disease and significant distal LAD disease.  Mild progression of disease in the distal RCA, right PDA and ramus branch.   Otherwise no significant change from prior catheterization.  No targets for revascularization.  Recommend continued aggressive medical therapy. Hypertension. Hyperlipidemia. Lipid panel 11/12/2023: LDL 78, HDL 44, TG 146, total 151. OSA. Not on CPAP. GERD. T2DM. CKD stage IIIb. Tobacco abuse.  In summary, patient has a history of extensive CAD s/p LAD and RCA stenting as detailed above.  In July 2021 he underwent LHC for unstable angina with PCI to proximal RCA.  More distal lesions could not be accessed secondary to being unable to advance the balloon.  He had ongoing angina and was referred to Dr. Ricka and underwent repeat LHC in August 2021 with successful PCI to proximal RCA.  He underwent repeat catheterization a few weeks later secondary to persistent angina with no significant change from prior cath and antianginal therapy was adjusted.  In the spring 2023 he complained of progressive exertional chest discomfort and underwent catheterization showing patent LAD and RCA stents with mild in-stent restenosis as detailed above.  Upon follow-up in January 2024 patient reported angina with higher level of activity otherwise he felt symptoms well-controlled with current antianginal therapy.  He reported dizziness with extending his neck for prolonged periods of time and underwent carotid ultrasound which showed near  normal carotids with normal flow in the vertebrals and subclavians.  Patient was seen in the clinic on 11/15/2022 for routine follow-up.  He reported being seen at  [facility] ED in May secondary to dizziness and vertigo that had overall resolved.  He reported occasional dizziness with quick position changes.  He noted stable angina occurring with overexertion that quickly resolved with rest.  No other changes were made.   Patient was last seen in the office by me on 07/22/2023 for routine follow-up.  He reported stable angina with overexertion resolving with rest.  Otherwise no new cardiac  complaints.  He reported soft BP with mild dizziness.  Orthostatics were negative.  He was instructed to decrease isosorbide  to 15 mg daily.  He was encouraged to continue to work on weight loss and smoking cessation.     History of Present Illness    Today, patient ***  CAD with stable angina S/p remote LAD and RCA stenting in the setting of NSTEMI, PCI with DES to proximal RCA July 2021, DES to proximal RCA August 2021.  Last [facility] April 2023 showed patent stents to RCA and LAD with mild in-stent restenosis otherwise stable diffuse moderate LCx disease and significant distal LAD disease with mild progression of disease in the distal RCA and right PDA and ramus branch otherwise no significant change from prior catheterization.  Patient*** - Continue aspirin , Plavix , rosuvastatin , Tricor , Toprol , isosorbide , Ranexa , as needed SL NTG.   Hypertension/dizziness BP today***.   - Continue Toprol , lisinopril , isosorbide .   Hyperlipidemia LDL 78 September 2025, not at goal. - Continue rosuvastatin  and Tricor . - Add Zetia 10 mg daily***   Obesity Patient is working on weight loss.  He has been taking Mounjaro  with good results. - Continue Mounjaro .***   Tobacco abuse Patient*** - Encouraged smoking cessation.  ROS: All other systems reviewed and are otherwise negative except as noted in History of Present Illness.  EKGs/Labs Reviewed        No results found for requested labs within last 365 days.   No results found for requested labs within last 365 days.   No results found for requested labs within last 365 days.   No results found for requested labs within last 365 days.  ***  Risk Assessment/Calculations    {Does this patient have ATRIAL FIBRILLATION?:[phone]} No BP recorded.  {Refresh Note OR Click here to enter BP  :1}***        Physical Exam    VS:  There were no vitals taken for this visit. , BMI There is no height or weight on file to calculate BMI.  GEN: Well  nourished, well developed, in no acute distress. Neck: No JVD or carotid bruits. Cardiac: *** RRR. *** No murmur. No rubs or gallops.   Respiratory:  Respirations regular and unlabored. Clear to auscultation without rales, wheezing or rhonchi. GI: Soft, nontender, nondistended. Extremities: Radials/DP/PT 2+ and equal bilaterally. No clubbing or cyanosis. No edema ***  Skin: Warm and dry, no rash. Neuro: Strength intact.  Assessment & Plan   ***  Disposition: ***     {Are you ordering a CV Procedure (e.g. stress test, cath, DCCV, TEE, etc)?   Press F2        :[id number]}   Signed, Barnie HERO. Izaac Reisig, DNP, NP-C

## 2024-01-12 ENCOUNTER — Ambulatory Visit: Admitting: Student

## 2024-01-23 NOTE — Progress Notes (Signed)
 Cardiology Clinic Note   Date: 01/26/2024 ID: KELDRIC POYER, DOB [birth year], MRN [id number]  Primary Cardiologist:  Deatrice Cage, MD  Chief Complaint   [name] is a 63 y.o. male who presents to the clinic today for routine follow up.   Patient Profile   [name] is followed by Dr. Cage for the history outlined below.       Past medical history significant for: CAD. Remote history of stenting to LAD and mid RCA in the setting of NSTEMI. LHC 08/31/2019 (unstable angina): Proximal RCA #1 99%, #2 75%.  Proximal to mid RCA 50%.  Distal RCA 70%.  Proximal LCx 50%.  Distal LCx 50%.  Mid LCx 50%.  Mid LM 25%.  Distal LAD #1 75%, #2 75%, #3 70%, #4 50%.  Proximal to mid LAD 50%.  D1 50%.  PCI with DES 2.5 x 15 mm to proximal RCA.  More distal lesions not attempted secondary to severe calcifications. Echo 08/31/2019: EF 50 to 55%.  No RWMA.  Grade I DD.  Normal RV size/function.  Trivial MR.  Technically difficult study. LHC 09/29/2019: Proximal RCA #1 20% in-stent restenosis, #2 85%.  Proximal to mid RCA 50%.  Distal RCA 50%.  RPDA 60%.  Successful angioplasty, intravascular lithotripsy and DES 3.0 x 26 mm to proximal RCA. LHC 10/18/2019 (stable angina): Mid LM 25%.  Proximal LCx 50%.  Distal LCx 50%.  Mid LCx 50%.  Distal RCA 50%.  Proximal RCA 30%.  Proximal to mid RCA 20%.  RPDA 60%.  Distal LAD #1 75%, #2 70%, #3 50%.  Mid LAD 30%.  Ramus 80%.  Patent stents in the LAD and RCA with mild in-stent restenosis.  No targets for revascularization. LHC 05/25/2021 (angina): Mid LM 25%.  Distal LAD #1 75%, #2 70%, #3 50%.  Mid LAD 30%.  Proximal LCx 50%.  Distal LCx 50%.  Mid LCx 50%.  Proximal RCA 30%.  Proximal mid RCA 20%.  Distal RCA 60%.  RPDA 70%.  Ramus 90%.  OM 340%.  Heavily calcified coronary arteries with patent LAD and RCA stents with mild in-stent restenosis.  Stable diffuse moderate LCx disease and significant distal LAD disease.  Mild progression of disease in the distal RCA, right  PDA and ramus branch.  Otherwise no significant change from prior catheterization.  No targets for revascularization.  Recommend continued aggressive medical therapy. Hypertension. Hyperlipidemia. Lipid panel 11/12/2023: LDL 78, HDL 44, TG 146, total 151. OSA. Not on CPAP. GERD. T2DM. CKD stage IIIb. Tobacco abuse.  In summary, patient has a history of extensive CAD s/p LAD and RCA stenting as detailed above.  In July 2021 he underwent LHC for unstable angina with PCI to proximal RCA.  More distal lesions could not be accessed secondary to being unable to advance the balloon.  He had ongoing angina and was referred to Dr. Ricka and underwent repeat LHC in August 2021 with successful PCI to proximal RCA.  He underwent repeat catheterization a few weeks later secondary to persistent angina with no significant change from prior cath and antianginal therapy was adjusted.  In the spring 2023 he complained of progressive exertional chest discomfort and underwent catheterization showing patent LAD and RCA stents with mild in-stent restenosis as detailed above.  Upon follow-up in January 2024 patient reported angina with higher level of activity otherwise he felt symptoms well-controlled with current antianginal therapy.  He reported dizziness with extending his neck for prolonged periods of time and underwent carotid  ultrasound which showed near normal carotids with normal flow in the vertebrals and subclavians.  Patient was seen in the clinic on 11/15/2022 for routine follow-up.  He reported being seen at  [name] ED in May secondary to dizziness and vertigo that had overall resolved.  He reported occasional dizziness with quick position changes.  He noted stable angina occurring with overexertion that quickly resolved with rest.  No other changes were made.   Patient was last seen in the office by me on 07/22/2023 for routine follow-up.  He reported stable angina with overexertion resolving with rest.  Otherwise no  new cardiac complaints.  He reported soft BP with mild dizziness.  Orthostatics were negative.  He was instructed to decrease isosorbide  to 15 mg daily.  He was encouraged to continue to work on weight loss and smoking cessation.     History of Present Illness    Today, patient is doing well. Patient denies shortness of breath, dyspnea on exertion, lower extremity edema, orthopnea or PND. No chest pain, pressure, or tightness. No palpitations. He reports no further lightheadedness or dizziness since decreasing isosorbide . He is active walking for exercise daily. He is working on quitting smoking.    ROS: All other systems reviewed and are otherwise negative except as noted in History of Present Illness.  EKGs/Labs Reviewed    EKG Interpretation Date/Time:  Monday January 26 2024 08:59:13 EST Ventricular Rate:  66 PR Interval:  208 QRS Duration:  104 QT Interval:  410 QTC Calculation: 429 R Axis:   -23  Text Interpretation: Normal sinus rhythm Normal ECG When compared with ECG of 22-Jul-2023 15:17, No significant change was found Confirmed by Loistine Sober [phone]) on 01/26/2024 9:09:05 AM    Physical Exam    VS:  BP 110/60   Pulse 66   Ht 5' 6 (1.676 m)   Wt 199 lb 9.6 oz (90.5 kg)   SpO2 98%   BMI 32.22 kg/m  , BMI Body mass index is 32.22 kg/m.  GEN: Well nourished, well developed, in no acute distress. Neck: No JVD or carotid bruits. Cardiac:  RRR.  No murmur. No rubs or gallops.   Respiratory:  Respirations regular and unlabored. Clear to auscultation without rales, wheezing or rhonchi. GI: Soft, nontender, nondistended. Extremities: Radials/DP/PT 2+ and equal bilaterally. No clubbing or cyanosis. No edema  Skin: Warm and dry, no rash. Neuro: Strength intact.  Assessment & Plan   CAD with stable angina S/p remote LAD and RCA stenting in the setting of NSTEMI, PCI with DES to proximal RCA July 2021, DES to proximal RCA August 2021.  Last [facility] April 2023 showed  patent stents to RCA and LAD with mild in-stent restenosis otherwise stable diffuse moderate LCx disease and significant distal LAD disease with mild progression of disease in the distal RCA and right PDA and ramus branch otherwise no significant change from prior catheterization.  Patient denies chest pain, pressure or tightness. EKG without acute changes. He is active walking daily for exercise.  - Continue aspirin , Plavix , rosuvastatin , Tricor , Toprol , isosorbide , Ranexa , as needed SL NTG.  Hypertension BP today 110/60. No lightheadedness or dizziness since decreasing isosorbide .    - Continue Toprol , lisinopril , isosorbide .   Hyperlipidemia LDL 78 September 2025, not at goal. Patient reports he had run out of rosuvastatin . His LDL is August 2025 was 140. Discussed the possible addition of Zetia if LDL not at goal.  - Continue rosuvastatin  and Tricor . - Lipid panel and direct LDL today. Consider  addition of Zetia if not at goal. Patient in agreement with plan.    Obesity Patient is working on weight loss.  He has been taking Mounjaro  with good results. - Continue Mounjaro .   Tobacco abuse Patient is working on quitting but slowly decreasing daily cigarettes. He is down to 1/2 pack a day. He is congratulated on his efforts.  - Encouraged smoking cessation.  Disposition: Lipid panel and direct LDL today. Return in 6 months or sooner as needed.          Signed, Barnie HERO. Miliyah Luper, DNP, NP-C

## 2024-01-26 ENCOUNTER — Encounter: Payer: Self-pay | Admitting: Student

## 2024-01-26 ENCOUNTER — Ambulatory Visit: Attending: Student | Admitting: Student

## 2024-01-26 VITALS — BP 110/60 | HR 66 | Ht 66.0 in | Wt 199.6 lb

## 2024-01-26 DIAGNOSIS — Z720 Tobacco use: Secondary | ICD-10-CM | POA: Diagnosis not present

## 2024-01-26 DIAGNOSIS — E6609 Other obesity due to excess calories: Secondary | ICD-10-CM | POA: Diagnosis not present

## 2024-01-26 DIAGNOSIS — E66811 Obesity, class 1: Secondary | ICD-10-CM

## 2024-01-26 DIAGNOSIS — E785 Hyperlipidemia, unspecified: Secondary | ICD-10-CM

## 2024-01-26 DIAGNOSIS — I2510 Atherosclerotic heart disease of native coronary artery without angina pectoris: Secondary | ICD-10-CM | POA: Diagnosis not present

## 2024-01-26 DIAGNOSIS — Z79899 Other long term (current) drug therapy: Secondary | ICD-10-CM

## 2024-01-26 DIAGNOSIS — Z6832 Body mass index (BMI) 32.0-32.9, adult: Secondary | ICD-10-CM | POA: Diagnosis not present

## 2024-01-26 NOTE — Patient Instructions (Signed)
 Medication Instructions:   Your physician recommends that you continue on your current medications as directed. Please refer to the Current Medication list given to you today.    *If you need a refill on your cardiac medications before your next appointment, please call your pharmacy*  Lab Work:  Your provider would like for you to have following labs drawn today Direct LDL, Lipid Panel.    If you have labs (blood work) drawn today and your tests are completely normal, you will receive your results only by:  MyChart Message (if you have MyChart) OR  A paper copy in the mail If you have any lab test that is abnormal or we need to change your treatment, we will call you to review the results.  Testing/Procedures:  None ordered at this time   Referrals:  None ordered at this time   Follow-Up:  At [facility], you and your health needs are our priority.  As part of our continuing mission to provide you with exceptional heart care, our providers are all part of one team.  This team includes your primary Cardiologist (physician) and Advanced Practice Providers or APPs (Physician Assistants and Nurse Practitioners) who all work together to provide you with the care you need, when you need it.  Your next appointment:   5 - 6 month(s)  Provider:    Deatrice Cage, MD or Barnie Hila, NP    We recommend signing up for the patient portal called MyChart.  Sign up information is provided on this After Visit Summary.  MyChart is used to connect with patients for Virtual Visits (Telemedicine).  Patients are able to view lab/test results, encounter notes, upcoming appointments, etc.  Non-urgent messages can be sent to your provider as well.   To learn more about what you can do with MyChart, go to [email or web address].

## 2024-01-27 ENCOUNTER — Ambulatory Visit: Payer: Self-pay | Admitting: Student

## 2024-01-27 LAB — LDL CHOLESTEROL, DIRECT: LDL Direct: 54 mg/dL (ref 0–99)

## 2024-01-27 LAB — LIPID PANEL
Chol/HDL Ratio: 2.5 ratio (ref 0.0–5.0)
Cholesterol, Total: 126 mg/dL (ref 100–199)
HDL: 50 mg/dL (ref >39)
LDL Chol Calc (NIH): 61 mg/dL (ref 0–99)
Triglycerides: 74 mg/dL (ref 0–149)
VLDL Cholesterol Cal: 15 mg/dL (ref 5–40)

## 2024-02-02 ENCOUNTER — Encounter: Payer: Self-pay | Admitting: Emergency Medicine

## 2024-02-02 NOTE — Progress Notes (Signed)
 Letter Sent.

## 2024-02-03 ENCOUNTER — Other Ambulatory Visit: Payer: Self-pay | Admitting: Nurse Practitioner

## 2024-03-03 ENCOUNTER — Telehealth: Payer: Self-pay | Admitting: Cardiovascular Disease

## 2024-03-03 NOTE — Telephone Encounter (Signed)
 Pt calling in regards to updated Disability form being filled out and sent to [employer]. Pt would like a c/b regarding this matter. Please advise

## 2024-03-04 ENCOUNTER — Telehealth: Payer: Self-pay | Admitting: Cardiovascular Disease

## 2024-03-04 NOTE — Telephone Encounter (Signed)
 Pt dropped off a letter placed in Dr. Renato mailbox

## 2024-03-04 NOTE — Telephone Encounter (Signed)
 Called and spoke with the patient to inquire if he had submitted copies of the disability paper work and paid the $29 fee for the paper work to be completed.  Patient states when I was there, there was a colored girl and a white girl who got a copy of the paperwork from the back, I think they were Dr. Renato nurses.  They were going to fill it out and they didn't say nothing about charging me.  Asked the patient for the names of any of the staff that he had spoken to regarding the matter, patient unable to provide that information.  Advised the patient that I can forward his request to Dr. Renato nurse and we will go from there.  Patient verbalized agreement with that plan.

## 2024-03-04 NOTE — Telephone Encounter (Signed)
 Called the patient to let him know that he would need to drop of the forms to be filled out and then pay a $29 fee if applicable.

## 2024-03-08 NOTE — Telephone Encounter (Signed)
 Form is ready

## 2024-03-10 NOTE — Telephone Encounter (Signed)
 Fax has been successfully sent and copy sent to be scanned in

## 2024-03-24 ENCOUNTER — Other Ambulatory Visit: Payer: Self-pay | Admitting: Internal Medicine

## 2024-04-19 ENCOUNTER — Ambulatory Visit: Admitting: Internal Medicine

## 2024-07-02 ENCOUNTER — Ambulatory Visit: Admitting: Student
